# Patient Record
Sex: Male | Born: 1960 | Race: White | Hispanic: No | Marital: Married | Smoking: Former smoker
Health system: Southern US, Community
[De-identification: ages and names within clinical notes are randomized; demographics above are authoritative.]

## PROBLEM LIST (undated history)

## (undated) DIAGNOSIS — I639 Cerebral infarction, unspecified: Secondary | ICD-10-CM

## (undated) DIAGNOSIS — Z9119 Patient's noncompliance with other medical treatment and regimen: Secondary | ICD-10-CM

## (undated) DIAGNOSIS — F1510 Other stimulant abuse, uncomplicated: Secondary | ICD-10-CM

## (undated) DIAGNOSIS — F1120 Opioid dependence, uncomplicated: Secondary | ICD-10-CM

## (undated) DIAGNOSIS — F1410 Cocaine abuse, uncomplicated: Secondary | ICD-10-CM

## (undated) DIAGNOSIS — F1920 Other psychoactive substance dependence, uncomplicated: Secondary | ICD-10-CM

## (undated) DIAGNOSIS — I10 Essential (primary) hypertension: Secondary | ICD-10-CM

## (undated) DIAGNOSIS — Z91199 Patient's noncompliance with other medical treatment and regimen due to unspecified reason: Secondary | ICD-10-CM

## (undated) DIAGNOSIS — E119 Type 2 diabetes mellitus without complications: Secondary | ICD-10-CM

## (undated) DIAGNOSIS — R7881 Bacteremia: Secondary | ICD-10-CM

## (undated) DIAGNOSIS — Z22322 Carrier or suspected carrier of Methicillin resistant Staphylococcus aureus: Secondary | ICD-10-CM

## (undated) DIAGNOSIS — I509 Heart failure, unspecified: Secondary | ICD-10-CM

## (undated) DIAGNOSIS — L89159 Pressure ulcer of sacral region, unspecified stage: Secondary | ICD-10-CM

## (undated) DIAGNOSIS — D62 Acute posthemorrhagic anemia: Secondary | ICD-10-CM

## (undated) DIAGNOSIS — E46 Unspecified protein-calorie malnutrition: Secondary | ICD-10-CM

## (undated) DIAGNOSIS — S8290XA Unspecified fracture of unspecified lower leg, initial encounter for closed fracture: Secondary | ICD-10-CM

## (undated) DIAGNOSIS — G894 Chronic pain syndrome: Secondary | ICD-10-CM

## (undated) DIAGNOSIS — I70202 Unspecified atherosclerosis of native arteries of extremities, left leg: Secondary | ICD-10-CM

## (undated) DIAGNOSIS — T4145XA Adverse effect of unspecified anesthetic, initial encounter: Secondary | ICD-10-CM

## (undated) DIAGNOSIS — M5136 Other intervertebral disc degeneration, lumbar region: Secondary | ICD-10-CM

## (undated) DIAGNOSIS — N390 Urinary tract infection, site not specified: Secondary | ICD-10-CM

## (undated) DIAGNOSIS — I96 Gangrene, not elsewhere classified: Secondary | ICD-10-CM

## (undated) DIAGNOSIS — B958 Unspecified staphylococcus as the cause of diseases classified elsewhere: Secondary | ICD-10-CM

## (undated) DIAGNOSIS — M51369 Other intervertebral disc degeneration, lumbar region without mention of lumbar back pain or lower extremity pain: Secondary | ICD-10-CM

## (undated) DIAGNOSIS — T8859XA Other complications of anesthesia, initial encounter: Secondary | ICD-10-CM

## (undated) DIAGNOSIS — G8929 Other chronic pain: Secondary | ICD-10-CM

## (undated) HISTORY — DX: Unspecified fracture of unspecified lower leg, initial encounter for closed fracture: S82.90XA

## (undated) HISTORY — PX: FEMUR SURGERY: SHX943

## (undated) HISTORY — PX: OTHER SURGICAL HISTORY: SHX169

## (undated) HISTORY — DX: Other chronic pain: G89.29

## (undated) HISTORY — PX: BACK SURGERY: SHX140

## (undated) HISTORY — PX: WRIST SURGERY: SHX841

## (undated) HISTORY — DX: Unspecified staphylococcus as the cause of diseases classified elsewhere: B95.8

## (undated) HISTORY — DX: Carrier or suspected carrier of methicillin resistant Staphylococcus aureus: Z22.322

## (undated) HISTORY — PX: HERNIA REPAIR: SHX51

---

## 2001-12-09 ENCOUNTER — Encounter: Payer: Self-pay | Admitting: Neurology

## 2001-12-09 ENCOUNTER — Ambulatory Visit (HOSPITAL_COMMUNITY): Admission: RE | Admit: 2001-12-09 | Discharge: 2001-12-09 | Payer: Self-pay | Admitting: Neurology

## 2002-01-16 ENCOUNTER — Encounter: Payer: Self-pay | Admitting: Thoracic Surgery

## 2002-01-20 ENCOUNTER — Inpatient Hospital Stay (HOSPITAL_COMMUNITY): Admission: RE | Admit: 2002-01-20 | Discharge: 2002-01-24 | Payer: Self-pay | Admitting: Neurosurgery

## 2002-01-20 ENCOUNTER — Encounter: Payer: Self-pay | Admitting: Thoracic Surgery

## 2002-01-21 ENCOUNTER — Encounter: Payer: Self-pay | Admitting: Thoracic Surgery

## 2002-01-22 ENCOUNTER — Encounter: Payer: Self-pay | Admitting: Thoracic Surgery

## 2002-01-24 ENCOUNTER — Encounter: Payer: Self-pay | Admitting: Thoracic Surgery

## 2002-02-11 ENCOUNTER — Inpatient Hospital Stay (HOSPITAL_COMMUNITY): Admission: AD | Admit: 2002-02-11 | Discharge: 2002-02-21 | Payer: Self-pay | Admitting: Neurosurgery

## 2002-02-11 ENCOUNTER — Encounter: Payer: Self-pay | Admitting: Infectious Diseases

## 2002-03-01 ENCOUNTER — Encounter (INDEPENDENT_AMBULATORY_CARE_PROVIDER_SITE_OTHER): Payer: Self-pay | Admitting: Specialist

## 2002-03-01 ENCOUNTER — Encounter: Payer: Self-pay | Admitting: Neurosurgery

## 2002-03-01 ENCOUNTER — Inpatient Hospital Stay (HOSPITAL_COMMUNITY): Admission: EM | Admit: 2002-03-01 | Discharge: 2002-03-10 | Payer: Self-pay | Admitting: Emergency Medicine

## 2002-03-10 ENCOUNTER — Encounter: Payer: Self-pay | Admitting: Thoracic Surgery

## 2002-03-10 ENCOUNTER — Inpatient Hospital Stay (HOSPITAL_COMMUNITY)
Admission: RE | Admit: 2002-03-10 | Discharge: 2002-03-25 | Payer: Self-pay | Admitting: Physical Medicine & Rehabilitation

## 2002-03-17 ENCOUNTER — Encounter: Payer: Self-pay | Admitting: Physical Medicine & Rehabilitation

## 2002-03-19 ENCOUNTER — Encounter: Payer: Self-pay | Admitting: Physical Medicine & Rehabilitation

## 2002-03-23 ENCOUNTER — Encounter: Payer: Self-pay | Admitting: Physical Medicine & Rehabilitation

## 2002-06-22 ENCOUNTER — Emergency Department (HOSPITAL_COMMUNITY): Admission: EM | Admit: 2002-06-22 | Discharge: 2002-06-22 | Payer: Self-pay | Admitting: Emergency Medicine

## 2002-06-22 ENCOUNTER — Encounter: Payer: Self-pay | Admitting: Emergency Medicine

## 2002-06-24 ENCOUNTER — Encounter: Admission: RE | Admit: 2002-06-24 | Discharge: 2002-09-22 | Payer: Self-pay

## 2002-06-24 ENCOUNTER — Encounter: Admission: RE | Admit: 2002-06-24 | Discharge: 2002-06-24 | Payer: Self-pay | Admitting: Infectious Diseases

## 2004-03-25 ENCOUNTER — Ambulatory Visit: Payer: Self-pay | Admitting: Gastroenterology

## 2005-04-02 ENCOUNTER — Inpatient Hospital Stay
Admission: EM | Admit: 2005-04-02 | Disposition: A | Discharge status: Home / Self Care | Payer: Self-pay | Source: Ambulatory Visit | Admitting: Surgery

## 2006-08-15 ENCOUNTER — Emergency Department
Admission: EM | Admit: 2006-08-15 | Disposition: A | Discharge status: Home / Self Care | Payer: Self-pay | Source: Ambulatory Visit

## 2007-08-15 ENCOUNTER — Ambulatory Visit: Payer: Self-pay | Admitting: Gastroenterology

## 2008-06-13 ENCOUNTER — Emergency Department: Admission: EM | Admit: 2008-06-13 | Payer: Self-pay | Source: Ambulatory Visit

## 2008-12-24 ENCOUNTER — Encounter: Admission: RE | Admit: 2008-12-24 | Discharge: 2008-12-24 | Payer: Self-pay | Admitting: Neurosurgery

## 2009-05-15 HISTORY — PX: APPENDECTOMY (OPEN): SHX54

## 2009-07-29 ENCOUNTER — Other Ambulatory Visit: Payer: Self-pay

## 2009-07-29 ENCOUNTER — Inpatient Hospital Stay
Admission: AD | Admit: 2009-07-29 | Disposition: A | Discharge status: Home / Self Care | Payer: Self-pay | Source: Other Acute Inpatient Hospital | Admitting: Surgery

## 2009-07-29 ENCOUNTER — Emergency Department
Admission: EM | Admit: 2009-07-29 | Disposition: A | Discharge status: Other Acute Inpatient Hospital | Payer: Self-pay | Source: Ambulatory Visit

## 2010-08-10 ENCOUNTER — Other Ambulatory Visit: Payer: Self-pay | Admitting: Neurosurgery

## 2010-08-10 DIAGNOSIS — M549 Dorsalgia, unspecified: Secondary | ICD-10-CM

## 2010-08-16 ENCOUNTER — Ambulatory Visit
Admission: RE | Admit: 2010-08-16 | Discharge: 2010-08-16 | Disposition: A | Discharge status: Home / Self Care | Payer: Medicare Other | Source: Ambulatory Visit | Attending: Neurosurgery | Admitting: Neurosurgery

## 2010-08-16 DIAGNOSIS — M549 Dorsalgia, unspecified: Secondary | ICD-10-CM

## 2010-08-16 MED ORDER — GADOBENATE DIMEGLUMINE 529 MG/ML IV SOLN
13.0000 mL | Freq: Once | INTRAVENOUS | Status: AC | PRN
  Administered 2010-08-16: 13 mL via INTRAVENOUS

## 2010-09-30 NOTE — Discharge Summary (Signed)
NAMEYOVANNY, COATS NO.:  [id number]   MEDICAL RECORD NO.:  [id number]                   PATIENT TYPE:  IPS   LOCATION:  4003                                 FACILITY:  [facility]   PHYSICIAN:  Ellwood Dense, M.D.                DATE OF BIRTH:  28-Aug-1960   DATE OF ADMISSION:  03/10/2002  DATE OF DISCHARGE:  03/25/2002                                 DISCHARGE SUMMARY   DISCHARGE DIAGNOSES:  1. Incision and drainage, thoracic wound with status post T7-8 laminectomy.  2. Postoperative anemia.  3. Closure of right open thoracotomy wound.  4. Left ankle discomfort, question gout.   HISTORY OF PRESENT ILLNESS:  Mr. Petrovich is a 50 year old male with history  of gout, T8, T9, and T10 HNP who underwent thoracotomy with pulpectomy and  anterior fusion on September 8.  Was discharged to home and on September 10  admitted back with marked infection requiring I&D of wound.  He was  discharged to home on rifampin and Rocephin on October 10.  On October 17,  patient with problems ambulating and voiding and was admitted on October 18  for work up.  He was noted to have epidural abscess T7-T8 with some cord  compression T10-11.  He underwent T7-T8 laminectomy for evacuation of  abscess and micro disc dissection October 19 by Dr. __________.  The patient  continues on Ancef and Rifampin for minimum of six weeks total antibiotic  therapy, per ID.  His lower extremity strength is improved.  Currently on  slow Decadron drip.  PT initiated and patient is noted to be at supervision  for bed mobility, monitored for sit to stand, close supervision for sitting  at edge of bed.   PAST MEDICAL HISTORY:  Significant for:  1. Gout.  2. Insomnia.  3. Cyst on left wrist.   ALLERGIES:  No known drug allergies.   SOCIAL HISTORY:  The patient lives with girlfriend in a mobile home, was  independent with cane prior to admission.  Has a girlfriend who has been  very supportive  and can assist as needed.  He smokes one and a half packs  per day, drinks alcohol once a month.   HOSPITAL COURSE:  Mr. Casmir Auguste was admitted to rehabilitation on March 10, 2002, for inpatient therapy to consist of PT/OT daily.  Past admission  was started on subcutaneous Lovenox for DVT prophylaxis.  Patient was slowly  tapered off of this and iron supplements were added for postoperative  anemia.  Labs done on October 28 showed hemoglobin 10.9, hematocrit 34.3,  white count 13.2, platelets 289,000, sodium 139, potassium 4.0, chloride 99,  CO2 28, BUN 7, creatinine 0.8, glucose 94, LFTs within normal limits.  Patient has been followed along by ID during his stay.  He has completed 42  days total of Rifampin and Ancef antibiotic therapy.  He  did well initially.  On November 5, he was noted to have some problems with left lower extremity  weakness and pain, and a thoracic MR was done showing a right greater than  left L5-S1 neural foraminal narrowing with no root compression, spinal  stenosis T11 to L5, and no abnormal enhancement to suggest an infection.  Neurosurgery was contacted regarding results and no new input obtained.  The  patient's lower extremity strength did improve, and patient was  neurologically stable at time of discharge.  Patient has been able to  tolerate slow taper of steroids which are to continue past discharge.  Pain  control has been reasonable.  He has had complaints of left foot pain and  has been worried that he has felt worse due to gout.  Uric acid level  checked, was noted to be normal at 5.2.  Patient was started on colchicine  0.6 mg a day which has helped his symptomatology.  Last check of labs from  November 6, shows sodium 140, potassium 3.7, chloride 104, CO2 28, BUN 2,  creatinine 0.8, glucose 84.  H&H has been relatively stable at 9.7 and 30.1.  Stool guaiac x1 has been negative.  Patient to continue on Keflex b.i.d.  past discharge with  follow-up with ID for total duration of treatment.   Patient's back incision has healed without any signs or symptoms of  infection, no redness, no erythema noted.  He was noted to have an open  right thoracotomy wound that has been dressed b.i.d. basis.  Dr. Edwyna Shell, who  has been following patient along, elected on secondary wound closure on  March 19, 2002.  A follow-up chest x-ray of November 9, showed  postoperative changes with left lower lobe atelectasis and is stable.   CONDITION ON DISCHARGE:  During his stay in rehabilitation, Mr. Weathington  progressed to being modified independent for transfers, modified independent  ambulating 150 feet on level surfaces, requires close supervision on uneven  surfaces.  Close supervision for navigating six stairs.  He requires  assistance with set up for upper body care and some supervision for low body  bathing, dressing.  Patient to continue with follow-up __________ hospital  outpatient rehabilitation center beginning November 12.  On March 25, 2002, patient is discharged to home.   DISCHARGE MEDICATIONS:  1. __________ one p.o. b.i.d.  2. OxyContin CR 20 mg b.i.d. x1 week, then tapered to 10 mg per day.  3. Colchicine 0.6 mg.  4. Neurontin 300 mg b.i.d.  5. Oxycodone IR 5 to 10 mg p.o. q.4-6h. p.r.n. pain.  6. Valium 5 mg t.i.d. p.r.n. spasms.  7. K-Dur 20 mEq per day while on prednisone.  8. Prednisone 10 mg (see taper).  9. Keflex 500 mg b.i.d.   DISCHARGE ACTIVITIES:  Supervision with assistance, as needed.  Use walker.   WOUND CARE:  Wash back with soap and water and apply dry dressing b.i.d.   SPECIAL INSTRUCTIONS:  No alcohol, no smoking, no driving.   FOLLOW-UP:  1. Patient is to follow-up with Dr. Thomasena Edis in two to three weeks.  2. Follow-up with Dr. Roxan Hockey at outpatient clinic December 8 at 9 AM.  3. Follow-up with Dr. Lamar Benes as needed.    Greg Cutter, P.A.                    Ellwood Dense, M.D.     PP/MEDQ  D:  06/13/2002  T:  06/13/2002  Job:  [id number]

## 2010-09-30 NOTE — Op Note (Signed)
   NAMECAESAR, Kevin Whitney                            ACCOUNT NO.:  [id number]   MEDICAL RECORD NO.:  [id number]                   PATIENT TYPE:  IPS   LOCATION:  4003                                 FACILITY:  [facility]   PHYSICIAN:  Ines Bloomer, M.D.              DATE OF BIRTH:  [birth year]   DATE OF PROCEDURE:  DATE OF DISCHARGE:                                 OPERATIVE REPORT   PREOPERATIVE DIAGNOSIS:  Open thoracotomy wound.   POSTOPERATIVE DIAGNOSIS:  Open thoracotomy wound.   OPERATION PERFORMED:  Secondary wound closure.   SURGEON:  D.P. Edwyna Shell, M.D.   ANESTHESIA:  1% Xylocaine anesthesia and IV sedation.   OPERATION:  After prepping and draping left chest, the area of wound opening  was prepped and draped in the usual sterile manner. The patient had a  previous thoracotomy that got infected, and now the wound is granulating  well, and it is time to close the wound. It was infiltrated with 1%  Xylocaine superiorly and inferiorly, and then after that, it was closed with  two interrupted #1 Prolene sutures, taking large bites and closing enough so  that it allowed for some drainage between the sutures. Dry sterile dressing  applied. The patient returned to recovery room in stable condition.                                               Ines Bloomer, M.D.    DPB/MEDQ  D:  03/20/2002  T:  03/20/2002  Job:  [id number]

## 2010-09-30 NOTE — Discharge Summary (Signed)
NAMEAIKEN, WITHEM NO.:  [id number]   MEDICAL RECORD NO.:  [id number]                   PATIENT TYPE:  INP   LOCATION:  5006                                 FACILITY:  [facility]   PHYSICIAN:  Cristi Loron, M.D.            DATE OF BIRTH:  Nov 18, 1960   DATE OF ADMISSION:  02/11/2002  DATE OF DISCHARGE:  02/21/2002                                 DISCHARGE SUMMARY   HISTORY OF PRESENT ILLNESS:  The patient is a 50 year old white male who  underwent a T9 corpectomy and fusion by Dr. Edwyna Shell on January 20, 2002.  He  did well initially and developed fever and wound discharge.  He was admitted  on February 11, 2002, with wound infection.  Cultures demonstrated a  methicillin-sensitive Staphylococcus aureus.  The patient was seen by ID.  Empiric vancomycin and Rocephin were started.  Dr. Edwyna Shell subsequently took  the patient to the operating room for an exploration of his thoracic wound  and left the wound open.  (For details of this operation, please refer to  the operative note).   The patient's postoperative course was unremarkable.  Dr. Ninetta Lights of ID  recommended the patient be treated with daily IV Rocephin as well as p.o.  Rifampin.  Arrangements were made for home health care Care for dressing  changes and IV antibiotics.  By February 14, 2002, the patient was afebrile.  Pulse was stable, and he was eating well, ambulating well.  His wound seemed  to be improving and he was requesting discharge.  He was therefore  discharged home on February 21, 2002.   DISCHARGE INSTRUCTIONS:  The patient was instructed to follow up with me in  two weeks.  He was instructed to wear his TLSO at all times while out of  bed.   DISCHARGE MEDICATIONS:  1. The patient was instructed to resume his outpatient medical regimen.  2. Given prescriptions for Percocet 5, #60 one to two p.o. q.6h. p.r.n. for     breakthrough pain, no refills.  3. Rifampin 300 mg #31 one  p.o. q.d., no refills.  4. OxyContin 20 mg #60 one p.o. b.i.d. no refills.  5. Valium 5 mg #50 one p.o. q6h. p.r.n. for muscle spasms, no refills.  6. As above, it was arranged for him to get home IV Rocephin.    FINAL DIAGNOSIS:  Wound infection.   PROCEDURE PERFORMED:  Exploration of wound.                                                Cristi Loron, M.D.    JDJ/MEDQ  D:  02/21/2002  T:  02/24/2002  Job:  [id number]   cc:   Ines Bloomer, MD  [address]  [place]  [place] [address]  Fax: [phone]   Lacretia Leigh. Ninetta Lights, M.D.

## 2010-09-30 NOTE — H&P (Signed)
Kevin Whitney, LAMPE NO.:  [id number]   MEDICAL RECORD NO.:  [id number]                   PATIENT TYPE:  INP   LOCATION:  3308                                 FACILITY:  [facility]   PHYSICIAN:  Coletta Memos, MD                    DATE OF BIRTH:  04-Feb-1961   DATE OF ADMISSION:  03/01/2002  DATE OF DISCHARGE:                                HISTORY & PHYSICAL   ADMISSION DIAGNOSES:  1. Increasing pain.  2. Acute onset lower extremity paraplegia.  3. Acute onset urinary retention.   INDICATIONS:  The patient is a 50 year old gentleman who was initially taken  to the operating room 01/20/2002 for herniated disk at T8-9 and T9-10.  He  underwent a thoracotomy on the left side and a corpectomy of T9 with  anterolateral fusion using Sofamor Danek instrumentation.  Postoperatively,  he actually did quite well and was much improved.  He was doing well until  he was re-admitted in October.  At that time, he was found to have a wound  infection.  He was admitted actually on 9/30 with a wound infection.  Cultures demonstrated a methicillin-sensitive Staphylococcus aureus.  He was  seen by infectious disease.  He was also taken to the operating room by Dr.  Edwyna Shell for exploration of the thoracic wound, and the wound was packed open.  An abscess was found, and cultures were taken from the wound.  Postoperatively, he was sent home on Rifampin, and he was also taking  Robaxin 1 g IV q.d.   After being discharged home on 02/21/2002, the patient had a few occasions  were he said he was unable to walk, and then the next day he would say he  was able to walk.  It was not always clear just what his strength was.  I  was called last night by the patient and his girlfriend.  At that time, the  girlfriend stated the patient was unable to walk, but then the patient got  on the phone and said that he could walk.  He said it probably was not any  different from what it had been  recently and that he was just hurting a  little bit more, that he thought he had not taken his pain medications  correctly.  I then received another phone call at approximately 5:15 this  morning and at that time the patient stated that he was no longer able to  bear weight on his legs.  He could not walk and that he had not been able to  urinate since the previous day.  At that point, I instructed him to go to  the emergency room.  When seen in the emergency room, a Foley catheter was  placed, and he emptied 1100 cc of urine.  He also had extremely poor  strength in the lower extremities. Although  a poor historian, it was clear  at that point that this was different from what he had been previously.  I  had an MRI performed, and it showed what appeared to be a fluid collection  most consistent with an epidural abscess centered really at the T7-T8 disk  space, but extending up and down.  Given his urinary retention, the new  onset paraplegia of the lower extremities, I did not feel that conservative  treatment was warranted.  I, therefore, recommended to the patient that he  undergo operative resection.   PAST MEDICAL HISTORY:  1. Thoracotomy and resection of thoracic disk herniations.  2. Gout.  3. Degenerative disk disease.  4. He had a cyst from his left wrist removed in the past.   MEDICATIONS ON ADMISSION:  1. OxyContin 20 mg twice a day.  2. Colchicine 0.5 mg twice a day.  3. Rifampin 300 mg once a day.  4. Valium.  5. Indomethain.  6. Robaxin.   SOCIAL HISTORY:  He does drink alcohol, six to eight beers on a weekend.  He  also smokes one-half pack cigarettes a day, stating that he quit last month.  He never abused illicit drugs.   REVIEW OF SYSTEMS:  There is packing in the left thoracotomy wound.   PHYSICAL EXAMINATION:  VITAL SIGNS:  Temperature 97, blood pressure 163/88,  pulse 82, respiratory rate 24.  NEUROLOGIC:  Alert and oriented x 4 and answering all questions   appropriately.  He is an extremely poor historian because he does no like to  give yes and no answers, and he often times goes off on tangents. He has  normal strength in his upper extremities.  He has very good hamstring and  hip extensor strength, and he has 4/5 strength in the right quadriceps, 4-/5  left quadriceps.  He has 4 to 4+/5 plantar flexion bilaterally. He has very  poor dorsiflexion on the left, much better on the right.  He has very poor  hip flexion on the left, only 2/5 and about 4/5 on the right. Sensation is  intact.  Toes are downgoing to plantar stimulation.  He does have clonus.  He is myelopathic.  Pupils are equal, round, and reactive to light.  Full  extraocular movements. Tongue and uvula in midline.  Shoulder shrug is  normal.  Memory, language, attention, and fund of knowledge are normal.  GENERAL:  He is well kempt and in no distress.  He has a dressing bandage  over the left thoracotomy wound which is being packed.   ASSESSMENT:  The patient is being admitted for treatment of acute urinary  retention and paraplegia of the lower extremities.  MRI suggests epidural  abscess.  He will be taken to the operating room for evacuation.                                               Coletta Memos, MD    KC/MEDQ  D:  03/02/2002  T:  03/02/2002  Job:  [id number]

## 2010-09-30 NOTE — Op Note (Signed)
NAMEJAMAINE, QUINTIN                            ACCOUNT NO.:  [id number]   MEDICAL RECORD NO.:  [id number]                   PATIENT TYPE:  INP   LOCATION:  3107                                 FACILITY:  [facility]   PHYSICIAN:  Ines Bloomer, M.D.              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   OPERATION PERFORMED:  Left thoracotomy for exposure for nuclear disk and  vertebral body with fusion.   SURGEON:  Dr. Dorita Sciara for the left thoracotomy.   FIRST ASSISTANT:  Lloyd Huger, RNA FA   SURGEON:  Dr. Darryll Capers did diskectomy and fusion.   This 50 year old patient had severe myelopathy and pain, and had a  __________ insertion of his T9-10 spinal cord.  He was turned to the left  lateral thoracotomy position after insertion of the double-lumen tube and  general anesthesia.  He was prepped and draped in the usual sterile manner.  Posterolateral thoracotomy was made, carried down with electrocautery  through the subcutaneous tissue and fascia.  Latissimus was divided.  His  radius was reflexed anteriorly and partially splint.  The seventh  intercostal space was entered, and a portion of the eighth rib was taken  subperiosteally.  A Finochietto retractor was inserted, and Tuffier was  placed at right angle.  The lung was collapsed, and a malleable retractor  was placed to deflect the diaphragm, as well as the lung.  The pleura was  dissected up over the 9-10 disk space and reflected medially.  This allowed  [place] to ligate the __________ artery was between clips and divided, and then a  spinal needle was placed in the disk space and confirmed by x-ray to be the  9-10 disk space.  After this had been done, Dr. Lovell Sheehan performed the  vertebrectomy and diskectomy and reconstructed the spacer as well as other  hardware.   After this had been completed, he placed a small catheter in the  disk space, and then the chest was closed in layers.  Two chest tubes were  placed.  Two separate stab wounds were tied in place and will seal itself.  The pericostal was closed with #1 chromic and tied in place, and then the on-  cu catheters were inserted underneath the pericostals, bringing in 2  separate stab wounds and using the introducer to  bring them in.  They were sutured and placed with 3-0 silk.  A marking block  was done in the usual fashion.  The chest was then closed with #1 Vicryl in  the muscle layer, 2-0 Vicryl in the subcutaneous tissue and 3-0 Vicryl  subcuticular stitch.  Dermabond for the skin.  Patient was then returned to  the recovery room in stable condition.  Ines Bloomer, M.D.    DPB/MEDQ  D:  01/20/2002  T:  01/21/2002  Job:  [id number]   cc:   Ines Bloomer, M.D.  [address]  [place]  [place] [address]  Fax: [phone]   Stacie Glaze, M.D. [facility]

## 2010-09-30 NOTE — Op Note (Signed)
NAME:  Kevin Whitney, Kevin Whitney NO.:  [id number]   MEDICAL RECORD NO.:  [id number]                   PATIENT TYPE:   LOCATION:                                       FACILITY:  [facility]   PHYSICIAN:  Coletta Memos, MD                    DATE OF BIRTH:  1960/06/13   DATE OF PROCEDURE:  03/02/2002  DATE OF DISCHARGE:                                 OPERATIVE REPORT   PREOPERATIVE DIAGNOSIS:  Thoracic epidural abscess.   POSTOPERATIVE DIAGNOSIS:  Thoracic epidural abscess.   PROCEDURE:  Thoracic laminectomy T7-T8 for epidural abscess evacuation with  microdissection.   SURGEON:  Coletta Memos, M.D.   ANESTHESIA:  General endotracheal.   COMPLICATIONS:  None.   FINDINGS:  A small amount of purulent material and granulation tissue  ventral to cord at T7-8 and a small canal with slightly tethered appearance  to the cord prior to abscess removal.   INDICATIONS:  The patient is a gentleman who has a Staphylococcus wound  infection after thoracotomy for a T9 corpectomy due to herniated disk at T8-  9 and T9-10, which caused myelopathy.  He came in today with complete  urinary retention of 1100 cc and weakness in the lower extremities.  I  therefore recommended and he agreed to undergo operative resection of what I  believe to be an abscess seen on MRI.   DESCRIPTION OF PROCEDURE:  The patient was brought to the operating room,  intubated and placed under general anesthesia without difficulty.  He was  rolled prone onto a Wilson frame, and all pressure points were properly  padded.  The back was shaved, prepped and draped in a sterile fashion.  I  made a skin incision with a #10 blade and took this down to the  thoracolumbar fascia.  I then exposed the thoracic lamina and placed towel  clips to localize them.  Using the hardware as a guide I knew I needed to be  at the top of the hardware and one level above.  I then extended my incision  rostrally.  Once I was done  I then again placed markers on the spinous  processes.  When I saw that I was in the correct position, I then proceeded  with the laminectomy.  Using a high speed drill and Kerrison punches along  with Leksell rongeur I was able to remove the ligament of flavum.  I removed  the lamina, spinous process of ligament of flavum of T7-T8.  I initially  encountered what was very thick and what I think was tenacious epidural fat,  but this was clearly not normal fat as it needed to be dissected from the  dural sac.  It did not appear anything other than yellow but it was much  thicker and much more fibrous.  I then drilled away bone laterally again  exposing  the left T7 nerve root, and identified the pedicle.  There was  adherent to the cord ventral laterally a yellowish mass.  I used an 11 blade  and microscopic dissection to open this mass, and there was purulent  material which emanated from that small mass.  I then dissected that with  microdissectors, small spatulas and scrape probes.  I was able to remove  some of the capsule which I also sent to the laboratory.  After achieving  what I believe to be a good decompression and completing the laminectomy to  relieve the pressure posteriorly I irrigated the  wound.  I took the microscope out of the operative field.  I then closed the  wound in layered fashion using Vicryl sutures.  Hemostasis was obtained.  The subcutaneous levels were also reapproximated with Vicryl.  Dermabond was  used for a sterile dressing.                                               Coletta Memos, MD    KC/MEDQ  D:  03/02/2002  T:  03/02/2002  Job:  [id number]

## 2010-09-30 NOTE — Consult Note (Signed)
NAMEWALFRED, BETTENDORF NO.:  [id number]   MEDICAL RECORD NO.:  [id number]                   PATIENT TYPE:  REC   LOCATION:  TPC                                  FACILITY:  [facility]   PHYSICIAN:  Zachary George, DO                      DATE OF BIRTH:  February 07, 1961   DATE OF CONSULTATION:  06/25/2002  DATE OF DISCHARGE:                                   CONSULTATION   REFERRING PHYSICIAN:  Cristi Loron, M.D.   Dear Dr. Lovell Sheehan:   Thank you very much for kindly referring Mr. Tashaun Obey to [facility] evaluation.  Mr. Word was seen in our  clinic today.  Please refer to the following for details regarding the  history and physical examination and treatment plan.  Once again, thank you  for allowing [place] to participate in the care of Mr. Rotter.   CHIEF COMPLAINT:  Back pain.   HISTORY OF PRESENT ILLNESS:  Mr. Mitrano is a pleasant, 50 year old, right-  hand-dominant male, who was kindly referred by Cristi Loron, M.D. for  evaluation for pain management.  The patient complains of thoracic back pain  with pain radiating into his lower extremities, difficulty walking, and some  mild spasticity in his left greater than right lower extremity.  The patient  states that he was working in 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  He began seeking medical treatment because  he was having pain in his feet.  Over time, his symptoms progressed, and he  developed myelopathy.  MRI of the thoracic spine revealed thoracic disk  herniations at T8-9 and T9-10, compressing the spinal cord with spinal cord  gliosis.  The patient was referred to Cristi Loron, M.D., who  performed thoracotomy with T9 corpectomy and T8 through 10 fusion on  01/20/02, for herniated disks, myelopathy, and Scheuermann's disease.  The  patient states he was doing very well following the surgery, but he  developed complication of a thoracic epidural abscess which manifested as  urinary retention. The patient was then taken back emergently to the  operating room on 03/02/02, where he underwent thoracic laminectomy T7-8.  Following that surgery, he was an inpatient at the [facility] [address],  where he was under the care of Ellwood Dense, M.D. for approximately nine  days.  He has been treated with various pain medications since that time,  including OxyContin 20 mg b.i.d. which patient states was too strong, and he  could not tolerate it.  He states that he poured most of the pills down the  drain.  Most recently, he has been on Norco 10 mg/325 mg up to 10 pills per  day per Dr. Lovell Sheehan.  He has also been on diazepam 10 mg b.i.d., but patient  states that he does not take that medication as it does not seem to help him  in any particular way.  He presents today to discuss pain management  options.  In terms of his hydrocodone, he states, I eat them until I don't  hurt.  I reviewed his pharmacy printout from [facility] which he brings with  him.  He apparently received 50 Norco 10 mg/325 mg on 06/23/02, and states  he is out of medications.  He states that he spilled some on the counter,  and they dissolved.  It also looks like he received 100 tablets on 06/13/02.  His father, who accompanies him, also produces a new prescription per Dr.  Lovell Sheehan for 100 more Norco to be taken 1-2 every 4-6 hours.  Per Dr.  Lovell Sheehan' notes, he has counseled the patient not to take more than 10 per  day.  The patient also lifts up his sleeve on his left arm and reveals a 50  mcg Duragesic patch.  He lifts up his other sleeve and reveals another 50  mcg Duragesic patch.  I questioned him about this because there is no  Duragesic on his pharmacy print-out.  The patient states that his primary  care Cleola Perryman, [name], M.D., at [facility] in [place], prescribed  Duragesic to him, but he cannot remember the dose.  The patches that he  currently has on belong to his girlfriend.  I counseled the patient about  not using other people's controlled substances and advised him against it.  He states that he put one patch on last night and did not fall asleep for  two hours, so he put another patch on.  He states that the patches are not  really helping his pain anyway.  He is very elusive when asking whether or  not the hydrocodone helps his pain.  Currently, his pain is a 9-10/10 on a  subjective scale.  He describes his pain as dull, achy, throbbing, sharp,  burning, constant, with associated numbness, tingling, and weakness.  Symptoms are worse with walking, bending, sitting, working, and therapy and  are not improved with any particular measure.  The patient's function and  quality of life indices have declined, and his sleep is poor.  He currently  walks with a standard walker secondary to lower extremity weakness.  I asked  Mr. Cardamone if he had the prescription for Duragesic written by his primary  care Alfreddie Consalvo, at which time he told me that his father may have it out in  the waiting room.  Nursing went and asked his father for prescriptions.  The  patient's father had prescriptions for Norco 10 mg/325 mg #100 written by  Dr. Lovell Sheehan on 06/24/02.  There was also a prescription from Dr. Lovell Sheehan for  antifungal mouthwash.  The patient's father also had two prescriptions from  [place] C. Ninetta Lights, M.D. for rifampin and Tequin.  He did not have any  prescriptions for Duragesic.  I called Dr. Webb Silversmith office and spoke to one  of his partners, who confirmed that Dr. Burnett Sheng had written a prescription  for Duragesic 50 mcg on 06/24/02.  I reviewed health and history form and 14  point review of systems.  The patient admits to erectile dysfunction.  He denies fevers, chills, night sweats.  Admits to significant weight loss  since  his surgery of greater than 100 pounds.  He denies bowel and  bladder  dysfunction at this time.   PAST MEDICAL HISTORY:  1. Oral thrush.  2. Epidural abscess.   PAST SURGICAL HISTORY:  Thoracotomy with T9 corpectomy and T8 through 10  fusion, T7-8 laminectomy for epidural abscess.   FAMILY HISTORY:  Hypertension.   SOCIAL HISTORY:  The patient states that he used to smoke and drink quite a  bit but quit smoking prior to his surgery and quit drinking prior to his  surgery.  He does admit to alcohol abuse.  He is single.  He is not  currently working.   ALLERGIES:  No known drug allergies.   MEDICATIONS:  1. Norco 10 mg/325 mg 10 pills per day.  2. Diazepam 10 mg b.i.d. p.r.n.  3. The patient has prescription for Duragesic 50 mcg per his primary care     Deetya Drouillard; however, he is self-medicating with 100 mcg of Duragesic over     the last day.   PHYSICAL EXAMINATION:  GENERAL:  A healthy-appearing male in no acute  distress.  VITAL SIGNS:  Blood pressure is 122/74, pulse 103, respirations 14 and  regular, O2 saturation 95% on room air.  The patient's voice is mildly  hoarse.  The patient has a difficult time getting up from the chair to a  standing position and needs to lean against the wall.  BACK:  I examined patient's back which reveals increased thoracic kyphosis.  There is a large right thoracotomy scar and a vertical midline incisional  scar in the thoracic spine which is well-healed.  There are no signs of  infection at this time.  Gait is antalgic and minimally spastic.  Manual  muscle testing is 5/5 right hip flexures, right knee extensors, bilateral  extensor hallucis longus, and bilateral ankle flexors, less than 3/5 left  hip flexures, left knee extensors, and left ankle dorsiflexors.  Sensory  examination reveals decreased light touch and pinprick in the patchy  distribution bilateral lower extremities, left greater than right.  Muscle  stretch reflexes are 3+/4  bilateral patellar, 2+/4 bilateral Achilles, and  2+/4 bilateral medial hamstrings with downgoing toes bilaterally.  There is  no ankle clonus noted at this time.  There is mild spasticity noted in the  left lower extremity and no significant abnormal tone in the right lower  extremity.  Straight leg raise is negative bilaterally in the seated  position.    IMPRESSION:  1. Thoracic back pain.  2. Thoracic disk herniation with myelopathy, status post thoracotomy with     thoracic fusion, T8 through T10, complicated by epidural abscess     requiring T7 through 8 laminectomy.  3. Spasticity lower extremities.  4. Chronic pain syndrome.   PLAN:  1. Discussed treatment options at length with Mr. Dobbins and his father.     This was an extensive consultation of 60 minutes duration in obtaining     records, information, and coordinating care. 2. In regard to patient's medications, I explained to him that Duragesic 50     mcg is roughly equivalent to 100 mg of hydrocodone per day, and I     recommend he give a fair trial of Duragesic 50 mcg q.72h. and to use his     hydrocodone as needed up to four times a day for breakthrough pain.  In     addition, we discussed other nonnarcotic adjunctive treatments to include     Zonegran 100 mg daily for neuropathic component, and I have provided him  with 14 sample pills.  3. Will provide Pamelor 10 mg 1-2 p.o. q.h.s. #60 for restorative sleep     capacity, no refills.  4. Consider Lidoderm patches.  5. Consider TENS unit.  6. If patient is not getting considerable relief of his pain with the above-     noted measures, including maximizing the anticonvulsant medications and     tricyclic antidepressants, would consider referral for evaluation for a     pump or spinal cord stimulator, although this may be contraindicated     given patient's previous infection and current treatment with     antibiotics.  7. The patient is instructed to follow up  with his primary care Makai Dumond.  8. The patient to call our clinic in two weeks to inform us of the efficacy     of Zonegran.  Will continue with the 100 mg daily dosing if it is     helpful.  If not, we will consider increasing to 200 mg daily.  9. The patient to return to clinic in one month for reevaluation.   The patient was educated on the above findings and recommendations and  understands.  He was also counseled on our controlled substance agreement at  length by one of our nursing staff.  He was instructed to bring all of his  pain medications including his used and unused Duragesic patches to his next  visit.  He understands.  There were no barriers to communication.                                               Zachary George, DO    JW/MEDQ  D:  06/25/2002  T:  06/25/2002  Job:  [id number]   cc:   Cristi Loron, M.D.  [address]  [place] [address]  Fax: [phone]   [name], M.D.  [facility] [place], [place]  Fax number [phone]

## 2010-09-30 NOTE — Op Note (Signed)
NAMEALAKAI, Kevin Whitney NO.:  [id number]   MEDICAL RECORD NO.:  [id number]                   PATIENT TYPE:  INP   LOCATION:  3107                                 FACILITY:  [facility]   PHYSICIAN:  Cristi Loron, M.D.            DATE OF BIRTH:  1960-12-24   DATE OF PROCEDURE:  01/20/2002  DATE OF DISCHARGE:                                 OPERATIVE REPORT   BRIEF HISTORY:  The patient is a 50 year old white male, who had suffered  from progressive difficulty with ambulation with numbness, tingling and  weakness of his legs.  He was worked up as an outpatient with a thoracic  MRI, which demonstrated a very large herniated disk at T9-10 and if any  centrally with severe spinal cord present, as well as a large herniated disk  at T8-9 on the left.  I discussed the various treatment options with him  including surgery.  The patient weighed the risks, benefits, and  alternatives to surgery and decided to proceed with a thoracotomy for a  thoracic corpectomy, fusion, and instrumentation.   PREOPERATIVE DIAGNOSES:  T8-9 and T9-10 herniated disk, thoracic spinal  stenosis, thoracic myelopathy, Scheuermann's Disease.   POSTOPERATIVE DIAGNOSES:  T8-9 and T9-10 herniated disk, thoracic spinal  stenosis, thoracic myelopathy, Scheuermann's Disease.   PROCEDURE:  Thoracotomy for T9 corpectomy; insertion interbody Vertisban  titanium spacer (Sophomore Danek) with a titanium screw and rod  stabilization T8 to T10, interbody arthrodesis T8-9 and T9-10 using  microdissection.   SURGEON:  Cristi Loron, M.D.   ASSISTANT:  Tanya Nones. Botero, MD   ANESTHESIA:  General endotracheal.   ESTIMATED BLOOD LOSS:  500 cc.   SPECIMENS:  None.   DRAINS:  None.   COMPLICATIONS:  None.   DESCRIPTION OF PROCEDURE:  The patient was brought to the operating room by  the anesthesia team.  General endotracheal anesthesia was induced.  The  patient was then turned to the  lateral position with his left side up.  His  thoracic region was then prepared with Betadine scrub and Betadine solution.  Sterile drapes were applied.  Dr. Edwyna Shell did the exposure (for the details  of this exposure, please refer to his operative note).   After Dr. Edwyna Shell had provided exposure to the lateral aspect of the  vertebral bodies at T8, 9, and 10, I took over the case.  I used  electrocautery to expose, clear soft tissue from the lateral aspect of the  T9 vertebra.  Dr. Edwyna Shell had previously ligated the radicular artery at the  T9 vertebra.  I used Hemoclips to ligate the radicular vein at T8 and T10,  and cleared the soft tissue from the lower aspect of T8 and T9 vertebral  bodies, exposed the articulation of the 8th, 9th, and 10th ribs to the  vertebral body.  I then obtained an intraoperative radiograph to confirm my  location.   We did the decompression by incising the T8-9 and T9-10 intervertebral disks  with a 15-blade scalpel and performed a partial diskectomy using pituitary  forceps.  I then used the Leksell rongeurs to perform a partial corpectomy.  The bone was saved for later use in the fusion process.  Then, I used a  high-speed drill to drill away the remainder of the vertebral body leaving a  rim of cortical bone posteriorly and removed the T8-9 and T9-10  intervertebral disks and scraped the vertebral end plates at T8 and T10  using the curets.   We then brought the operating microscope into the field.  Under instrument  magnification and illumination, completed the microdissection/decompression.  I used the high-speed drill to complete the drilling of the bone away  posteriorly exposing the posterior longitudinal ligament at T9 and incised  the ligament with a 15-blade scalpel and used the microscope to dissect the  epidural tissue and expose the underlying dura.  The dura of course was then  decompressed using microdissection and a combination of curets and  the  Kerrison punch being very careful the proceed with the decompression in an  anterior direction.  We carefully decompressed the thecal sac.  There was a  large herniated disk at T9-10 centrally, which was removed using the  pituitary forceps and the Kerrison punches again in a ventral direction,  i.e. away from the spinal cord.  The procedure was repeated at T8-9 with a  large lateral herniated disk, which was similarly removed with Kerrison  punch and pituitary forceps.  At this point, we had good decompression of  the thecal sac and spinal cord from T8-9 down to T9-10.  We obtained  hemostasis using Gelfoam.   I now turned attention to the interbody fusion.  We obtained the small short  thoracic Vertisban titanium device and placed it into the corpectomy site  and extended the device somewhat distracting the T8 and T10 vertebral  bodies.  There was a good snug fit of the device and we then obtained an  intraoperative radiograph that demonstrated good position of the device.   We then completed the arthrodesis at T8-9 and T9-10 by placing local  morselized autograft bone, i.e. the bone had been cleared of soft tissue and  obtained on the corpectomy into the corpectomy site and around the cage.   Then, we then turned our attention to the anterior spinal instrumentation.  We placed the thoracic staples along the lateral aspect of the T8 and T10  vertebral body after removing some lateral spondylosis using the Leksell  rongeurs.  The awl was then used to drill a pilot hole through the staples.  The hole was then tapped and two 6.5 x 45 mm screws were placed at T10 and  two 6.5 x 40 mm screws were placed at T8.  Intraoperative radiograph was  obtained and the instrumentation looked good.  The screws were then  connected with a 70 and 80 mm rod.  The appropriate cap was placed and then tightened using the snap-off device, the snap-off screws, and then a lock  connector was placed and  appropriately tightened.  This was a good, firm  construct and decompression looked good.  The wound was then copiously  irrigated with Bacitracin solution and the solution was removed, stringent  hemostasis was obtained using bipolar electrocautery and Gelfoam.  A 10 mm  flat Jackson-Pratt drain was placed in the epidural space and brought out  through a separate stab wound.  At this point, Dr. Edwyna Shell took over the case  and completed the closure.                                                Cristi Loron, M.D.    JDJ/MEDQ  D:  01/21/2002  T:  01/22/2002  Job:  [id number]

## 2010-09-30 NOTE — Discharge Summary (Signed)
Kevin Whitney, Kevin Whitney NO.:  [id number]   MEDICAL RECORD NO.:  [id number]                   PATIENT TYPE:  INP   LOCATION:  3031                                 FACILITY:  [facility]   PHYSICIAN:  Cristi Loron, M.D.            DATE OF BIRTH:  [birth year]   DATE OF ADMISSION:  01/20/2002  DATE OF DISCHARGE:  01/24/2002                                 DISCHARGE SUMMARY   NOTATION:  For full details of this admission, please refer to typed history  and physical.   HISTORY OF PRESENT ILLNESS:  The patient is a 50 year old white male who  suffered from a thoracic myelopathy.  A workup for an MRI demonstrated a  huge herniated disk at T9-10 and a large herniated disk at T9.  I discussed  the various treatment options with him and recommended that he undergo  surgery.  The patient weighed the risks, benefits, and alternatives of  surgery and decided to proceed with the operation.   HOSPITAL COURSE:  I admitted the patient to [facility] on  01/20/2002 with a diagnosis of  T8-9 and T9-10 herniated disk, thoracic  myelopathy paraparesis.  On admission he underwent surgery.  Dr. Edwyna Shell did  a thoracotomy to expose the T9 vertebra.  Then I did a T9 corpectomy with  fusion at T8-9 and T9-10, and anterior instrumentation of this.  A  __________ spacer and a titanium rod and screw anterior fixation  (Medtronic.)  His surgery went well without complications (for full details  of the operation please refer to both Dr. Scheryl Darter and my operative note.)   Postoperative course:  The patient was initially monitored in the  neurosurgical intensive care unit.  He did well.  Over the next couple days  Dr. Edwyna Shell removed the chest tubes, and we removed the drain.  The patient  was transferred to the Neurosurgical General floor.   He did complain of a lot of left ankle pain which was a chronic problem for  him.  He had a diagnosis of gout in the past.  We had the  hospice, i.e. Dr.  Tresa Endo see him and he has treated him with medications including prednisone  and colchicine.  We checked an ankle x-ray which demonstrated small joint  effusion, but otherwise was okay.  The patient's ankle pain resolved by  01/24/2002.   We did have the rehab doctors see the patient and they thought it was  appropriate for in-patient rehab.  By 01/24/2002 the patient said he was  feeling fine.  His ankle felt better.  He was ambulating well and his wound  was healing well without signs of infection.  He was afebrile.  His vitals  were stable.  He was not interested in going to in-patient rehab and he  wanted to be discharged to home.  He was therefore discharged to home on  01/24/2002.  DISCHARGE INSTRUCTIONS:  The patient was instructed to wear his TLSO at all  times while out of bed and to put it on while he is lying in bed.  I  expressed the importance of this to him.  He seemed to understand.  I also  stressed the importance of him quitting smoking.  He was instructed to  followup with me in 4 weeks and Dr. Edwyna Shell per his instructions.   DISCHARGE MEDICATIONS:  1. Percocet 5 #60 1-2 p.o. q.4 h. p.r.n. for pain limit 8 per day.  2. Valium 5 mg #50 one p.o. q.6 h. p.r.n. for muscle spasms.  3. Colchicine 0.6 mg #90 one p.o t.i.d.  4. Prednisone 40 mg #2 one p.o. q.d. times two days, all with no refills.   FINAL DIAGNOSIS:  T8-9 and T9-10 herniated nucleus pulposus, stenosis,  thoracic myelopathy.   PROCEDURE PERFORMED:  Thoracotomy for T9 vertebrectomy interbody fusion T9  and T9-10, anterior spinal instrumentation with __________  vertebral body  replacement, and titanium plate and screw fixation (Medtronic.)                                               Cristi Loron, M.D.    JDJ/MEDQ  D:  01/24/2002  T:  01/26/2002  Job:  [id number]   cc:   Ines Bloomer, M.D.  [address]  [place]  [place] [address]  Fax: [phone]

## 2010-09-30 NOTE — Discharge Summary (Signed)
NAMETEREK, BEE NO.:  [id number]   MEDICAL RECORD NO.:  [id number]                   PATIENT TYPE:  INP   LOCATION:  3002                                 FACILITY:  [facility]   PHYSICIAN:  Cristi Loron, M.D.            DATE OF BIRTH:  04-Jan-1961   DATE OF ADMISSION:  03/01/2002  DATE OF DISCHARGE:  03/10/2002                                 DISCHARGE SUMMARY   For full details of this admission, please refer to the history and  physical.   BRIEF HISTORY:  The patient is a 50 year old white male who presented with a  severe thoracic myelopathy and was found to have a T8-9 and T9-10 thoracic  herniated disk with severe spinal cord compression. He previously underwent  a thoracotomy for T9 corpectomy with interbody fusion and lateral  instrumentation. The surgery went well and he initially made a great  recovery. He did, however, subsequently develop a wound infection and was  readmitted and had his wound explored by Dr. Edwyna Shell and it turned out that  it was a staph aureus. The patient was seen by ID and started on  antibiotics. It was arranged for him to get home IV antibiotics and he was  suddenly discharged.   The patient was at home and developed acute urinary retention and left lower  extremity paraplegia. Dr. Franky Macho saw the patient as he was on call and had  an MRI scan which demonstrated an epidural abscess. He recommended a  thoracic laminectomy to drain the abscess. The patient weighed the risks,  benefits, and alternatives of surgery and decided to proceed with the  operation.   For further details of this admission, please refer to the history and  physical.   HOSPITAL COURSE:  Dr. Franky Macho admitted the patient on March 01, 2002 with  a diagnosis of acute urinary retention and left lower extremity paraplegia.  The patient underwent an emergent T2 to T8 laminectomy for evacuation of his  hematoma. The surgery went well (for  further details of the operation please  refer to Dr. Sueanne Margarita operative note).   POSTOPERATIVE COURSE:  The patient's postoperative course was as follows;  his strength improved after the laminectomy and we again had infectious  disease see the patient. The patient saw the physical therapist. We ask that  the physical medicine and rehabilitation doctors see the patient.   The patient at his initial hospitalization had declined to go into the rehab  unit. By March 10, 2002, the patient was felt to be stable to go into the  rehab unit. Dr. Edwyna Shell did see the patient throughout this hospitalization  and did some wound care.   The patient was transferred to the rehab unit on March 10, 2002.    FINAL DIAGNOSES:  Thoracic epidural abscess.   PROCEDURE PERFORMED:  Thoracic laminectomy for drainage of abscess.  Cristi Loron, M.D.    JDJ/MEDQ  D:  06/12/2002  T:  06/12/2002  Job:  [id number]   cc:   Ines Bloomer, M.D.  [address]  [place]  [place] [address]  Fax: [phone]   Cliffton Asters, M.D.  [address] [place]  [place] [address]  Fax: [phone]

## 2010-09-30 NOTE — Discharge Summary (Signed)
   Kevin Whitney, Kevin Whitney NO.:  [id number]   MEDICAL RECORD NO.:  [id number]                   PATIENT TYPE:  INP   LOCATION:  3031                                 FACILITY:  [facility]   PHYSICIAN:  Tanya Nones. 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   FINAL DIAGNOSIS:  Myelopathy secondary to herniated disc at the level of T10-  T11.   MEDICAL HISTORY:  The patient was admitted because of weakness and  ___________.  It was found that he has a large herniated disc at the level  T10-T11.  Surgery was advised.   HOSPITAL COURSE:  The patient was taken to surgery for a left thoracotomy  followed by dissection of the vertebral body and replacement with a  interbody fusion was made by Dr. Lovell Sheehan and Dr. Edwyna Shell.   Today, the patient is doing much better.  He is ambulating without cane.  He  is ready to go home.   CONDITION ON DISCHARGE:  Improved.   MEDICATIONS:  1. Percocet p.r.n. for pain.   DIET:  Regular.   ACTIVITY:  He is not to drive and is not to do any lifting.   FOLLOW UP:  He is to be seen by Dr. Lovell Sheehan next week.                                               Tanya Nones. Jeral Fruit, MD    EMB/MEDQ  D:  01/24/2002  T:  01/27/2002  Job:  [id number]

## 2010-09-30 NOTE — Op Note (Signed)
   NAMETEAL, BONTRAGER NO.:  [id number]   MEDICAL RECORD NO.:  [id number]                   PATIENT TYPE:  INP   LOCATION:  5006                                 FACILITY:  [facility]   PHYSICIAN:  Ines Bloomer, M.D.              DATE OF BIRTH:  1961-03-13   DATE OF PROCEDURE:  DATE OF DISCHARGE:                                 OPERATIVE REPORT   PREOPERATIVE DIAGNOSIS:  Status post fusion of  P10-12 via thoracotomy  incision, with drain of left thoracotomy wound.   POSTOPERATIVE DIAGNOSIS:  Status post fusion of P10-12 via thoracotomy  incision, with drain of left thoracotomy wound.   OPERATION:  Exploration of left thoracotomy wound.   SURGEON:  Ines Bloomer, M.D.   ANESTHESIA:  General.   INDICATIONS FOR PROCEDURE:  This patient had a previous fixation of a left  thoracotomy wound.  The left thoracotomy approach to the 6th intercostal  space and did well.  He went home, but he developed a fever and then started  draining out of the wound posteriorly.  He was brought to the operating room  for an exploration.  Vancomycin had been started.  He was growing positive  cocci in blood cultures.   DESCRIPTION OF PROCEDURE:  After general anesthesia the patient was turned  to the left lateral thoracotomy position.  The posterior portion of the  wound was opened for approximately 6.0 cm.  Dissection was carried down  through the subcutaneous tissue.  There was a portion of a rib that was in  the wound.  Approximately 4.0 to 5.0 cm of this rib was removed in order to  debride the area, and several areas of necrotic tissue were also debrided.  The wound went down between the ribs, down into the pleural space, but  appeared to be draining well, and the lung was stuck such that the area was  irrigated out copiously, and then packed with a Betadine-soaked curet.  An  ABD was then applied to the wound.  Cultures were taken.  The patient  returned to the  recovery room in stable condition.                                                Ines Bloomer, M.D.    DPB/MEDQ  D:  02/13/2002  T:  02/17/2002  Job:  [id number]

## 2011-02-07 ENCOUNTER — Emergency Department (HOSPITAL_COMMUNITY): Payer: Medicare Other

## 2011-02-07 ENCOUNTER — Inpatient Hospital Stay (HOSPITAL_COMMUNITY)
Admission: EM | Admit: 2011-02-07 | Discharge: 2011-02-21 | DRG: 592 | Disposition: A | Discharge status: Home Health | Payer: Medicare Other | Attending: Internal Medicine | Admitting: Internal Medicine

## 2011-02-07 DIAGNOSIS — L89609 Pressure ulcer of unspecified heel, unspecified stage: Secondary | ICD-10-CM | POA: Diagnosis present

## 2011-02-07 DIAGNOSIS — Z79899 Other long term (current) drug therapy: Secondary | ICD-10-CM

## 2011-02-07 DIAGNOSIS — L8993 Pressure ulcer of unspecified site, stage 3: Secondary | ICD-10-CM | POA: Diagnosis present

## 2011-02-07 DIAGNOSIS — M545 Low back pain, unspecified: Secondary | ICD-10-CM | POA: Diagnosis present

## 2011-02-07 DIAGNOSIS — F29 Unspecified psychosis not due to a substance or known physiological condition: Secondary | ICD-10-CM | POA: Diagnosis present

## 2011-02-07 DIAGNOSIS — Z9889 Other specified postprocedural states: Secondary | ICD-10-CM

## 2011-02-07 DIAGNOSIS — I10 Essential (primary) hypertension: Secondary | ICD-10-CM | POA: Diagnosis present

## 2011-02-07 DIAGNOSIS — D61818 Other pancytopenia: Secondary | ICD-10-CM | POA: Diagnosis not present

## 2011-02-07 DIAGNOSIS — E876 Hypokalemia: Secondary | ICD-10-CM | POA: Diagnosis present

## 2011-02-07 DIAGNOSIS — R5381 Other malaise: Secondary | ICD-10-CM | POA: Diagnosis present

## 2011-02-07 DIAGNOSIS — G8220 Paraplegia, unspecified: Secondary | ICD-10-CM | POA: Diagnosis present

## 2011-02-07 DIAGNOSIS — F411 Generalized anxiety disorder: Secondary | ICD-10-CM | POA: Diagnosis present

## 2011-02-07 DIAGNOSIS — L89109 Pressure ulcer of unspecified part of back, unspecified stage: Secondary | ICD-10-CM | POA: Diagnosis present

## 2011-02-07 DIAGNOSIS — E440 Moderate protein-calorie malnutrition: Secondary | ICD-10-CM | POA: Diagnosis present

## 2011-02-07 DIAGNOSIS — L8990 Pressure ulcer of unspecified site, unspecified stage: Secondary | ICD-10-CM | POA: Diagnosis present

## 2011-02-07 DIAGNOSIS — L8992 Pressure ulcer of unspecified site, stage 2: Secondary | ICD-10-CM | POA: Diagnosis present

## 2011-02-07 DIAGNOSIS — K029 Dental caries, unspecified: Secondary | ICD-10-CM | POA: Diagnosis present

## 2011-02-07 DIAGNOSIS — L89899 Pressure ulcer of other site, unspecified stage: Secondary | ICD-10-CM | POA: Diagnosis present

## 2011-02-07 DIAGNOSIS — G894 Chronic pain syndrome: Secondary | ICD-10-CM | POA: Diagnosis present

## 2011-02-07 DIAGNOSIS — L02219 Cutaneous abscess of trunk, unspecified: Secondary | ICD-10-CM | POA: Diagnosis present

## 2011-02-07 DIAGNOSIS — F17200 Nicotine dependence, unspecified, uncomplicated: Secondary | ICD-10-CM | POA: Diagnosis present

## 2011-02-07 DIAGNOSIS — L8994 Pressure ulcer of unspecified site, stage 4: Secondary | ICD-10-CM | POA: Diagnosis present

## 2011-02-07 LAB — DIFFERENTIAL
Basophils Absolute: 0 10*3/uL (ref 0.0–0.1)
Basophils Relative: 0 % (ref 0–1)
Eosinophils Absolute: 0.4 10*3/uL (ref 0.0–0.7)
Monocytes Absolute: 0.4 10*3/uL (ref 0.1–1.0)
Neutro Abs: 6.5 10*3/uL (ref 1.7–7.7)

## 2011-02-07 LAB — CBC
HCT: 41.5 % (ref 39.0–52.0)
MCH: 28.4 pg (ref 26.0–34.0)
MCV: 84.7 fL (ref 78.0–100.0)

## 2011-02-07 LAB — COMPREHENSIVE METABOLIC PANEL
AST: 15 U/L (ref 0–37)
BUN: 5 mg/dL — ABNORMAL LOW (ref 6–23)
CO2: 36 mEq/L — ABNORMAL HIGH (ref 19–32)
Chloride: 84 mEq/L — ABNORMAL LOW (ref 96–112)
Creatinine, Ser: <0.47 mg/dL — ABNORMAL LOW (ref 0.50–1.35)
Total Bilirubin: 0.5 mg/dL (ref 0.3–1.2)

## 2011-02-08 DIAGNOSIS — F29 Unspecified psychosis not due to a substance or known physiological condition: Secondary | ICD-10-CM

## 2011-02-08 LAB — BASIC METABOLIC PANEL
BUN: 5 mg/dL — ABNORMAL LOW (ref 6–23)
Calcium: 7.7 mg/dL — ABNORMAL LOW (ref 8.4–10.5)
Creatinine, Ser: <0.47 mg/dL — ABNORMAL LOW (ref 0.50–1.35)
Glucose, Bld: 65 mg/dL — ABNORMAL LOW (ref 70–99)

## 2011-02-08 LAB — CBC
HCT: 35 % — ABNORMAL LOW (ref 39.0–52.0)
Hemoglobin: 11.6 g/dL — ABNORMAL LOW (ref 13.0–17.0)
MCH: 27.8 pg (ref 26.0–34.0)
MCHC: 33.1 g/dL (ref 30.0–36.0)
MCV: 83.9 fL (ref 78.0–100.0)
RDW: 14.6 % (ref 11.5–15.5)

## 2011-02-08 LAB — DIFFERENTIAL
Eosinophils Relative: 8 % — ABNORMAL HIGH (ref 0–5)
Lymphocytes Relative: 23 % (ref 12–46)
Monocytes Absolute: 0.3 10*3/uL (ref 0.1–1.0)
Monocytes Relative: 6 % (ref 3–12)
Neutro Abs: 3.3 10*3/uL (ref 1.7–7.7)

## 2011-02-09 LAB — COMPREHENSIVE METABOLIC PANEL
Albumin: 2.2 g/dL — ABNORMAL LOW (ref 3.5–5.2)
Alkaline Phosphatase: 47 U/L (ref 39–117)
BUN: 7 mg/dL (ref 6–23)
Potassium: 3 mEq/L — ABNORMAL LOW (ref 3.5–5.1)
Total Protein: 5.2 g/dL — ABNORMAL LOW (ref 6.0–8.3)

## 2011-02-09 LAB — DIFFERENTIAL
Basophils Absolute: 0 10*3/uL (ref 0.0–0.1)
Basophils Relative: 1 % (ref 0–1)
Eosinophils Absolute: 0.2 10*3/uL (ref 0.0–0.7)
Eosinophils Relative: 5 % (ref 0–5)
Lymphocytes Relative: 15 % (ref 12–46)
Monocytes Absolute: 0.2 10*3/uL (ref 0.1–1.0)

## 2011-02-09 LAB — MAGNESIUM: Magnesium: 1.9 mg/dL (ref 1.5–2.5)

## 2011-02-09 LAB — CBC
HCT: 33.2 % — ABNORMAL LOW (ref 39.0–52.0)
MCHC: 31.9 g/dL (ref 30.0–36.0)
Platelets: 148 10*3/uL — ABNORMAL LOW (ref 150–400)
RDW: 14.8 % (ref 11.5–15.5)

## 2011-02-10 ENCOUNTER — Inpatient Hospital Stay (HOSPITAL_COMMUNITY): Payer: Medicare Other

## 2011-02-10 DIAGNOSIS — S21209A Unspecified open wound of unspecified back wall of thorax without penetration into thoracic cavity, initial encounter: Secondary | ICD-10-CM

## 2011-02-10 DIAGNOSIS — S21109A Unspecified open wound of unspecified front wall of thorax without penetration into thoracic cavity, initial encounter: Secondary | ICD-10-CM

## 2011-02-10 LAB — VANCOMYCIN, TROUGH: Vancomycin Tr: 16.9 ug/mL (ref 10.0–20.0)

## 2011-02-11 DIAGNOSIS — L0291 Cutaneous abscess, unspecified: Secondary | ICD-10-CM

## 2011-02-11 DIAGNOSIS — L0390 Cellulitis, unspecified: Secondary | ICD-10-CM

## 2011-02-12 LAB — BASIC METABOLIC PANEL
BUN: 6 mg/dL (ref 6–23)
CO2: 33 mEq/L — ABNORMAL HIGH (ref 19–32)
Chloride: 104 mEq/L (ref 96–112)
Creatinine, Ser: 0.9 mg/dL (ref 0.50–1.35)
GFR calc Af Amer: >60 mL/min (ref >60)
Potassium: 2.7 mEq/L — CL (ref 3.5–5.1)

## 2011-02-12 LAB — CBC
HCT: 32.6 % — ABNORMAL LOW (ref 39.0–52.0)
Hemoglobin: 10.4 g/dL — ABNORMAL LOW (ref 13.0–17.0)
WBC: 3.6 10*3/uL — ABNORMAL LOW (ref 4.0–10.5)

## 2011-02-13 LAB — BASIC METABOLIC PANEL
CO2: 31 mEq/L (ref 19–32)
Calcium: 8.1 mg/dL — ABNORMAL LOW (ref 8.4–10.5)
Chloride: 107 mEq/L (ref 96–112)
Creatinine, Ser: 0.95 mg/dL (ref 0.50–1.35)
Glucose, Bld: 91 mg/dL (ref 70–99)
Sodium: 143 mEq/L (ref 135–145)

## 2011-02-14 DIAGNOSIS — F29 Unspecified psychosis not due to a substance or known physiological condition: Secondary | ICD-10-CM

## 2011-02-14 LAB — CULTURE, BLOOD (ROUTINE X 2)
Culture  Setup Time: 201209260420
Culture: NO GROWTH

## 2011-02-15 LAB — BASIC METABOLIC PANEL
BUN: 15 mg/dL (ref 6–23)
CO2: 34 mEq/L — ABNORMAL HIGH (ref 19–32)
Calcium: 8.2 mg/dL — ABNORMAL LOW (ref 8.4–10.5)
Chloride: 102 mEq/L (ref 96–112)
Creatinine, Ser: 0.98 mg/dL (ref 0.50–1.35)
Glucose, Bld: 95 mg/dL (ref 70–99)

## 2011-02-15 LAB — DIFFERENTIAL
Basophils Relative: 0 % (ref 0–1)
Eosinophils Absolute: 0.2 10*3/uL (ref 0.0–0.7)
Eosinophils Relative: 4 % (ref 0–5)
Lymphocytes Relative: 19 % (ref 12–46)
Neutro Abs: 4.1 10*3/uL (ref 1.7–7.7)
Neutrophils Relative %: 69 % (ref 43–77)

## 2011-02-15 LAB — CBC
HCT: 33.6 % — ABNORMAL LOW (ref 39.0–52.0)
MCH: 27.7 pg (ref 26.0–34.0)
MCV: 85.3 fL (ref 78.0–100.0)
RBC: 3.94 MIL/uL — ABNORMAL LOW (ref 4.22–5.81)
WBC: 5.9 10*3/uL (ref 4.0–10.5)

## 2011-02-15 NOTE — Consult Note (Signed)
Kevin Whitney, Kevin Whitney NO.:  [id number]  MEDICAL RECORD NO.:  [id number]  LOCATION:  1321                         FACILITY:  [facility]  PHYSICIAN:  Gardiner Barefoot, MD    DATE OF BIRTH:  Oct 13, 1960  DATE OF CONSULTATION: DATE OF DISCHARGE:                                CONSULTATION   REFERRING PHYSICIAN:  Hind IEda Paschal, MD  REASON FOR CONSULTATION:  Management of decubitus ulcer.  HISTORY OF PRESENT ILLNESS:  This is a 50 year old male with a history of paraplegia, who had come in by EMS due to a decubitus ulcer that he has had for several weeks.  He does state that he has had pain for many years, but otherwise he does not answer questions directly.  The parents are at the bedside and do state that he has not, to their knowledge, had any signs of fever or chills recently and no changes really in him over the last several weeks.  The patient is bedbound and has had a wound of his thorax area before.  He does have a history of MSSA wound cultures back in 2003.  The patient does appear to have poor care.  He does have chronic pain for which he does use a fentanyl patch.  Of note, the patient does use inappropriate things for care of his ulcer including Lysol spray.  Otherwise, history and review of systems from the patient is difficult to obtain.  PAST MEDICAL HISTORY: 1. History of back surgeries and now paraplegia. 2. Chronic low back pain. 3. Hypertension.  MEDICATIONS AT HOME:  Include; 1. Diazepam 10 mg daily. 2. Cyclophosphamide 10 mg daily. 3. Atenolol 25 mg daily. 4. Fentanyl 75 mcg every 48 hours. 5. Antibiotics include vancomycin and Zosyn.  ALLERGIES:  KEFLEX of an unknown reaction.  SOCIAL HISTORY:  The patient does live at home with his parents and is on disability.  He is a smoker, but denies alcohol or drugs.  FAMILY HISTORY:  No history of immune deficiency.  REVIEW OF SYSTEMS:  This was unobtainable from the patient.  PHYSICAL  EXAMINATION:  VITAL SIGNS:  T-max is 100.2, T-current is 98.5, pulse is 81, respirations 18, blood pressure is 107/77, O2 saturation is 97% on room air. GENERAL:  The patient is awake and alert, and appears in no acute distress. CARDIOVASCULAR:  Regular rate and rhythm.  No murmurs, rubs, or gallops. LUNGS:  Clear to auscultation bilaterally. ABDOMEN:  Soft, nontender, nondistended.  Positive bowel sounds.  No hepatosplenomegaly. EXTREMITIES:  With no edema.  Wound is stage II and no signs of infection by report, I was unable to take down the bandage.  LABORATORY DATA:  Labs include blood cultures negative x2.  Last WBC count was 3.8, hemoglobin 10.6, and platelets 148.  Creatinine is less than 0.47.  ASSESSMENT:  Decubitus ulcer and mild cellulitis.  RECOMMENDATIONS:  Since this has some mild cellulitis by report, but does have good granulation tissue and no signs of infection, no abscess, and negative findings on the MRI for osteomyelitis or other concerns for infection, there is no indication for IV therapy.  He has had MSSA infections in the past, but  I do not see any history of MRSA, however, he is isolated for that and therefore, I will have him start on p.o. therapy with clindamycin for the cellulitis, that will also cover MRSA due to a report of a history of MRSA; also in light of his Keflex allergy.  We would recommend he take that for a few days and then can stop.  I will otherwise stop his IV antibiotics at this time.  Thank you for the consult and please call for any further issues or changes.     Gardiner Barefoot, MD     RWC/MEDQ  D:  02/11/2011  T:  02/11/2011  Job:  [id number]  Electronically Signed by Staci Righter MD on 02/15/2011 03:03:01 PM

## 2011-02-17 DIAGNOSIS — F29 Unspecified psychosis not due to a substance or known physiological condition: Secondary | ICD-10-CM

## 2011-02-17 LAB — BASIC METABOLIC PANEL
BUN: 14 mg/dL (ref 6–23)
CO2: 32 mEq/L (ref 19–32)
Chloride: 102 mEq/L (ref 96–112)
GFR calc non Af Amer: >90 mL/min (ref >90)
Glucose, Bld: 94 mg/dL (ref 70–99)
Potassium: 2.9 mEq/L — ABNORMAL LOW (ref 3.5–5.1)
Sodium: 139 mEq/L (ref 135–145)

## 2011-02-17 LAB — NA AND K (SODIUM & POTASSIUM), RAND UR: Potassium Urine: 23 mEq/L

## 2011-02-18 DIAGNOSIS — F29 Unspecified psychosis not due to a substance or known physiological condition: Secondary | ICD-10-CM

## 2011-02-18 LAB — BASIC METABOLIC PANEL
CO2: 30 mEq/L (ref 19–32)
Calcium: 7.8 mg/dL — ABNORMAL LOW (ref 8.4–10.5)
Chloride: 104 mEq/L (ref 96–112)
Potassium: 4.5 mEq/L (ref 3.5–5.1)
Sodium: 137 mEq/L (ref 135–145)

## 2011-02-19 LAB — PHOSPHORUS: Phosphorus: 2.7 mg/dL (ref 2.3–4.6)

## 2011-02-19 LAB — BASIC METABOLIC PANEL
Chloride: 99 mEq/L (ref 96–112)
GFR calc Af Amer: >90 mL/min (ref >90)
GFR calc non Af Amer: >90 mL/min (ref >90)
Glucose, Bld: 80 mg/dL (ref 70–99)
Potassium: 4.5 mEq/L (ref 3.5–5.1)
Sodium: 136 mEq/L (ref 135–145)

## 2011-02-19 LAB — CBC
HCT: 34.6 % — ABNORMAL LOW (ref 39.0–52.0)
Platelets: 168 10*3/uL (ref 150–400)
RBC: 4.08 MIL/uL — ABNORMAL LOW (ref 4.22–5.81)
RDW: 16.9 % — ABNORMAL HIGH (ref 11.5–15.5)
WBC: 7.5 10*3/uL (ref 4.0–10.5)
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
## 2011-03-01 NOTE — Discharge Summary (Signed)
NAMESARGON, Kevin Whitney                ACCOUNT NO.:  [id number]  MEDICAL RECORD NO.:  [id number]  LOCATION:  1321                         FACILITY:  [facility]  PHYSICIAN:  Iara Monds I Mayan Dolney, MD      DATE OF BIRTH:  06-Jun-1960  DATE OF ADMISSION:  02/07/2011 DATE OF DISCHARGE:                              DISCHARGE SUMMARY   DISCHARGE DIAGNOSES: 1. Decubitus ulcer, with left thigh wound, and left scapulothoracic     ulcer, 50% necrotic with loose hanging eschar, but no evidence of     infection. 2. History of paraplegia/lower extremity weakness after back surgery. 3. History of back surgery, lower back pain status post surgery. 4. Hypokalemia. 5. Anxiety disorder, psychologic.  CONSULTATIONS:  Surgical consulted, Infectious Disease consulted, and Wound Care consulted.  DISCHARGE MEDICATIONS: 1. Abilify 2 mg p.o. q.h.s. 2. Atenolol 25 mg p.o. daily. 3. Clindamycin 300 mg p.o. q.6 hours for 10 days. 4. Flora-Q one tablet twice daily.5. Ensure 237 3 times daily. 6. Fentanyl patch 125 mcg transdermal q.72 hours. 7. Potassium chloride 10 mEq p.o. daily. 8. Flexeril 10 mg p.o. q.8 hours as needed. 9. Diazepam 10 mg p.o. q.8 hours as needed.  HISTORY OF PRESENT ILLNESS:  This is a 50 year old male with history of paraplegia, brought by EMS because of slight decubitus ulcer developed over the last week.  The patient is not really a good historian secondary to his mental illness.  History of pain mainly from the history of present illness from the ED and EMS.  For the last 4 months, the patient has limited mobility with some paraplegia, no extremity weakness mainly after back surgery dated back to 2003.  He developed large thoracic ulcer in the left side of the thoracic after surgical evaluation with some foul odor, and some moderate to severe pain.  PHYSICAL EXAMINATION:  On evaluation in the emergency room, VITAL SIGNS:  Temperature 98.9, blood pressure 125/85, pulse rate 95, and oxygen  saturation 100%.  LABORATORY DATA:  White blood cell count on admission 8.6, hemoglobin 13.9, hematocrit 41.5, and platelets 222.  Sodium 134, potassium 2.5, chloride 84, BUN 5, and creatinine less than 0.47.  Albumin 3.  The patient was admitted, started on broad-spectrum antibiotics Zosyn and vancomycin, and accordingly Wound Care consulted and surgical consultation done, but surgical team, they did not feel there is any evidence of infection, and accordingly debridement was not required. The patient remained on hydrotherapy for the above large thoracic and sacral ulcer.  During hospital stay and secondary to his mental status, Psychiatric consulted.  I recommended Abilify and the patient was started on this.  The patient is having slight phobia and you cannot obtain any further history from him.  The patient would like to be discharged home.  Psychiatric will be on board to evaluate competency to make his own decision.  Discussed the case with his parents who would like the patient to go home.  From medical standpoint, the patient is medically stable to be discharged home with wound care versus nursing home/__________ hydrotherapy.  Case Management working on placement.     Kevin Dembeck Bosie Helper, MD     HIE/MEDQ  D:  02/13/2011  T:  02/13/2011  Job:  [id number]  Electronically Signed by Ebony Cargo MD on 03/01/2011 02:39:35 PM

## 2011-03-06 NOTE — Consult Note (Signed)
Kevin Whitney, Kevin Whitney NO.:  [id number]  MEDICAL RECORD NO.:  [id number]  LOCATION:  1321                         FACILITY:  [facility]  PHYSICIAN:  Sandria Bales. Ezzard Standing, M.D.  DATE OF BIRTH:  [birth year]  DATE OF CONSULTATION:  02/10/2011                                CONSULTATION   REQUESTING PHYSICIAN:  Hind Bosie Helper, MD  CONSULTING SURGEON:  Sandria Bales. Ezzard Standing, MD  REASON FOR CONSULTATION:  Left back wound.  HISTORY OF PRESENT ILLNESS:  Kevin Whitney is a 50 year old gentleman, who has a history of paraplegia secondary to some complicated back problems dating back to 2003, leaving him as paraplegia.  The patient after reviewing the history and physical by the admitting physician apparently is unreliable with regard to history.    Apparently, he has had a left-sided flank/chest wall wound with necrosis extending down to his ribs. This has been present for several weeks, though the patient tells me this has been going on "for years."  He has chronic pain control by primary care physician in [place].  He denies any fever, chills, shortness of breath, or labored breathing.  He denies any trauma.  Apparently, he has been trying to keep care of this by himself by using home tools and using Lysol spray.  His parents gave some additional history of some of his habits at home.  However, he has now been admitted as there is concern for infection of this wound.  Physical Therapy had already been consulted and has done some type of pulse lavage and hydrotherapy.  Apparently, there is report of a large piece of necrotic material or tissue that has already been removed from the wound during the hydrotherapy.  However, surgical consult is requested to rule out deep abscess and need for further surgical debridement.  PAST MEDICAL HISTORY:  Includes again chronic paraplegia secondary to complications of back problems.  SURGICAL HISTORY:  Includes multiple back  procedures.  SOCIAL HISTORY:  The patient lives at home with his parents.  He has been on disability due to his paraplegia.  Smokes about half pack of cigarettes a day.  Denies any alcohol or illicit drug use.  ALLERGIES:  No known drug or latex allergies.  MEDICATIONS:  Include: 1. Diazepam. 2. Cyclophosphamide. 3. Atenolol. 4. Fentanyl patches.  FAMILY HISTORY:  Noncontributory to present case.  REVIEW OF SYSTEMS:  Please see history of present illness for pertinent findings.  PHYSICAL EXAMINATION:  GENERAL:  Reveals a 50 year old gentleman, who is nontoxic-appearing. VITAL SIGNS:  Current vital signs show a temperature of 98.4, heart rate of 84, respiratory rate of 18, blood pressure of 90/60, oxygen saturation 97% on room air. ENT:  Unremarkable. NECK:  Supple without lymphadenopathy. CHEST:  Clear to auscultation.  No wheezes, rhonchi, or rales.  On the left lateral to posterior chest wall, there is a large wound measuring about 18 x 12 cm.  The wound itself actually looks fairly clean.  There is periwound erythema or induration to suggest infection.  There is no fluctuance to suggest an adjacent abscess.  Majority of the wound is healthy granulation tissue.  I am able to palpate down over the ribs,  but not directly onto the bone.  There is some loose fibrinous exudate material and an undermining trach that goes medially, but there are no deep abscess pockets or components to get into.  Additionally noted is a very small stage II sacral decubitus wound without evidence of infection. HEART:  Regular rate and rhythm.  No murmurs, gallops, or rubs.  The abdomen is soft, nondistended, nontender. EXTREMITIES:  Diminished muscle tone and strength secondary to paraplegia.  Small superficial ulcers and wounds of the lower extremities without evidence of acute infection.  LABORATORY DATA:  Labs yesterday showed a white blood cell count of 3.8, hemoglobin of 10.6, hematocrit of  33.2, platelet count of 148. Metabolic panel shows a sodium of 139, potassium of 3.0, chloride of 98, CO2 of 36, BUN of 7, creatinine of 0.47, glucose of 83, albumin depleted at 2.2.  Blood cultures thus far negative.  No formal imaging with the exception of a chest x-ray has been performed.  There is no evidence of obvious subcutaneous emphysema.  IMPRESSION: 1. 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 2. Chronic paraplegia. 3. Probable protein calorie malnutrition.  RECOMMENDATIONS:  The wound actually looks clean and I do not feel that there is need for surgical debridement at present time.  I am not suspicious of any deep infection or abscess cavity, but an MRI has been ordered for further evaluation.    At the present time, we would recommend continued antibiotics and continued hydrotherapy to facilitate wound cleaning.  Ideally as this wound continues to clean up, he could be a candidate for wound VAC and eventual split-thickness skin graft; however, I am not sure how compliant he would be.  There is a hint of history of poor hygiene and possible things that may be detrimental to his wound and a VAC or a split-thickness skin graft would be significantly damaged by the patient spraying household products such as Lysol on the wound.    In the meantime, I think he would need appropriate nutritional evaluation prior to discharge home and recommend followup with an outpatient wound care center who could continue followup of this chronic wound.   Brayton El, PA-C   Sandria Bales. Ezzard Standing, M.D., FACS   KB/MEDQ  D:  02/10/2011  T:  02/10/2011  Job:  [id number]  cc:   Hind Bosie Helper, MD  Electronically Signed by Brayton El  on 03/01/2011 03:50:55 PM Electronically Signed by Ovidio Kin M.D. on 03/06/2011 10:09:04 PM
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
## 2012-10-04 NOTE — Discharge Summary (Signed)
[facility]                               DISCHARGE SUMMARY              NAME: DERNER, Walter West                  ADMITTED:   07/29/2009       MR#:  [id number]                            DISCHARGED: 08/03/2009       DOB:  Dec 29, 1960                           ACCT#:      [id number]       _________________________________________________________________       FINAL DIAGNOSES:       1.  Acute and chronic ruptured appendicitis.       2.  Diabetes mellitus.              OPERATIONS:       On 07/30/2009, open appendectomy with dissection of phlegmon,       removal of appendix, including Endo GIA staple of the cecum,       placement of drains, skin wound closure over a Penrose drain.              HISTORY OF PRESENT ILLNESS:       This is a somewhat complicated history in this 52 year old male,       who has been ill for about a week.  He was referred from the       emergency room in [facility]  He is       diabetic, although not actively taking his diabetic medications.       He was found on CT to have a marked phlegmon in the right lower       quadrant, and referred to surgery.              We had originally considered a trial of nonoperative management       with antibiotics, etc., and then, if he calmed down, an interval       appendectomy, but he remained tender.  He was very anxious about       surgery, wanting to proceed with surgery on the day following       admission.              LABORATORY DATA:       Lab work on admission:  White count was 8800, hemoglobin 12.6,       hematocrit 37.2 and no bands.              His admitting glucose 171.  Electrolytes 136, 3.4, 99, 29.8, BUN       11, creatinine 0.88 and calcium 8.8.              HOSPITAL COURSE:       On the morning following admission, we proceeded to the       operating room and explored through a right lower quadrant       transverse incision, [place] type incision.  There was a       large  phlegmon, a large centralized abscess.  This was all  broken up.  Fluid obtained for culture.  Copious irrigation.  We       ultimately performed appendectomy, taking a GIA Endo stapler       across the cecum, at the base of the appendix.  Ultimately, we       were able to close, leaving the J-P drain in the pelvis and a       Penrose drain in the subcuticular.              Post-op, he was maintained on Zosyn antibiotics.  His culture       demonstrates moderate growth of Bacteroides fragilis, also E.       coli with multiple sensitivities, and a strep viridans.              He made satisfactory progress in diet and activity, and was       ultimately ready for discharge on 03/22.              Note that his blood sugar control has improved.  He was       maintained on subcutaneous insulin and q.6 h. Chem-strips.       Also, his oral agents were restarted, which he had not been       using at home, including glyburide 5 mg p.o. daily with              breakfast and metformin 500 mg p.o. b.i.d. with breakfast and       supper.              Ultimately, the Penrose drain was removed.  The subcutaneous       drain was removed, and then the deeper drain was removed, and       the deeper Blake drain was removed.  Staples remained in place.       His blood sugar appeared to be under reasonable control.  He was       discharged.              DISCHARGE MEDICATIONS:       To continue with:              1.  Glyburide.       2.  Metformin.       3.  Percocet p.r.n. for pain.       4.  Flagyl 500 mg p.o. q.i.d.       5.  Cipro 500 mg p.o. b.i.d.       6.  He will continue his lisinopril 10 milligrams p.o. daily.              FOLLOWUP:       I will see him back in the office in 2 days, to remove the       remainder of his staples and check his wound.                            **PRELIMINARY REPORT UNLESS SIGNED**       SEE DOCUMENT IMAGING SYSTEM FOR FINAL REPORT                                  ________________________________  Rocco Serene, MD                                            ID:   [id number]       DocType:   [id number]       \:   AP       /:   263       DD:  08/04/2009 14:43:44       DT:  08/05/2009 08:43:44       JOB: [id number]            Dr. Myer Peer ([id number])       >  Authenticated by Vern Claude, MD On 08/06/2009 05:19:51 PM

## 2012-10-04 NOTE — Op Note (Signed)
[facility]                           OPERATIVE/PROCEDURE REPORT              NAME: Walter, West                  ADMITTED:   07/29/2009       MR#:  [id number]                            ACCT#:      [id number]       DOB:  1960-08-31       _________________________________________________________________       DATE OF SURGERY:       07/30/2009              PREOPERATIVE DIAGNOSIS:       Chronic ruptured appendicitis.              POSTOPERATIVE DIAGNOSIS:       Chronic ruptured appendicitis.              OPERATION:       Open appendectomy with dissection of phlegmon, removal of       appendix, including Endo-GIA staple of the cecum, placement of       drains. Skin wound closed over a Penrose drain.              BRIEF HISTORY:       This is a somewhat complicated history in this 52 year old male       who has been ill for about a week.  He was referred from the       emergency room physician in [place] at [facility].              He was found on CT to have marked phlegmon in the right lower       quadrant and was referred to surgery.              We had originally considered a trial of nonoperative management       with antibiotics, etc.  If he calmed down, then an interval       appendectomy but he remained tender, was very anxious about       surgery, and we proceed with surgery on the day following       admission.              DESCRIPTION OF OPERATION:       After adequate endotracheal general anesthesia had been       obtained, the Foley catheter was placed.  The abdomen was       prepped with ChloraPrep  solution and draped sterilely.              A right lower quadrant transverse incision was utilized with the       rectus muscle being partially divided.              On entering the abdominal cavity, there was a significant       phlegmon in the right lower quadrant.  We get into a pus-filled       cavity.  Cultures were obtained, about 10 cc of  pus-like fluid       is obtained for culture.  We copiously irrigate.  We break up pockets.  We ultimately with       tedious dissection completely free up the large bulky retrocecal       retroperitoneal appendix which was ultimately freed up, taking       down ______ and/or multiple hemoclips until we get the appendix       totally freed up to its base.  At the base, the cecum is       actually reasonably supple.              We come across the base with a 45 mm Endo-GIA blue cartridge       stapler which produces a nice staple line and removed the       appendix entirely.                     That stapled line has good hemostasis.  We imbricate this with       interrupted sutures of 3-0 silk.              Next, we copiously irrigated with antibiotic solution and a       Blake drain in place, and I close the fascia with a running 0       PDS in the posterior rectus sheath and peritoneum and external       and internal oblique fascia.              The anterior rectus sheath was closed with interrupted       figure-of-eight sutures of #1 PDS.  Subcu is closed with 3-0       Vicryl with a Penrose drain.  ______ staples. The patient       tolerated the procedure well, and left the operating room in       stable condition.              ESTIMATED BLOOD LOSS:       50 cc.                            **PRELIMINARY REPORT UNLESS SIGNED**       SEE DOCUMENT IMAGING SYSTEM FOR FINAL REPORT                                 ________________________________                              Rocco Serene, MD                                            ID:   [id number]       DocType:   [id number]       \:   DM       /:   263       DD:  07/30/2009 16:13:20       DT:  07/31/2009 16:29:41       JOB: [id number]       >  Authenticated by Vern Claude, MD On 08/06/2009 05:19:27 PM

## 2015-11-02 ENCOUNTER — Encounter: Payer: Self-pay | Admitting: Pain Medicine

## 2015-11-02 ENCOUNTER — Ambulatory Visit: Payer: Medicare Other | Attending: Pain Medicine | Admitting: Pain Medicine

## 2015-11-02 VITALS — BP 123/73 | HR 113 | Temp 98.0°F | Resp 16 | Ht 73.0 in | Wt 165.0 lb

## 2015-11-02 DIAGNOSIS — M419 Scoliosis, unspecified: Secondary | ICD-10-CM | POA: Diagnosis not present

## 2015-11-02 DIAGNOSIS — Z993 Dependence on wheelchair: Secondary | ICD-10-CM | POA: Insufficient documentation

## 2015-11-02 DIAGNOSIS — M545 Low back pain: Secondary | ICD-10-CM | POA: Diagnosis present

## 2015-11-02 DIAGNOSIS — R531 Weakness: Secondary | ICD-10-CM | POA: Diagnosis not present

## 2015-11-02 DIAGNOSIS — A4902 Methicillin resistant Staphylococcus aureus infection, unspecified site: Secondary | ICD-10-CM | POA: Insufficient documentation

## 2015-11-02 DIAGNOSIS — M47816 Spondylosis without myelopathy or radiculopathy, lumbar region: Secondary | ICD-10-CM

## 2015-11-02 DIAGNOSIS — R569 Unspecified convulsions: Secondary | ICD-10-CM | POA: Diagnosis not present

## 2015-11-02 DIAGNOSIS — M5134 Other intervertebral disc degeneration, thoracic region: Secondary | ICD-10-CM | POA: Insufficient documentation

## 2015-11-02 DIAGNOSIS — G4700 Insomnia, unspecified: Secondary | ICD-10-CM | POA: Insufficient documentation

## 2015-11-02 DIAGNOSIS — Z9889 Other specified postprocedural states: Secondary | ICD-10-CM | POA: Insufficient documentation

## 2015-11-02 DIAGNOSIS — M546 Pain in thoracic spine: Secondary | ICD-10-CM | POA: Diagnosis present

## 2015-11-02 DIAGNOSIS — M797 Fibromyalgia: Secondary | ICD-10-CM | POA: Diagnosis not present

## 2015-11-02 DIAGNOSIS — M5136 Other intervertebral disc degeneration, lumbar region: Secondary | ICD-10-CM | POA: Diagnosis not present

## 2015-11-02 DIAGNOSIS — M533 Sacrococcygeal disorders, not elsewhere classified: Secondary | ICD-10-CM | POA: Insufficient documentation

## 2015-11-02 DIAGNOSIS — M79606 Pain in leg, unspecified: Secondary | ICD-10-CM | POA: Diagnosis present

## 2015-11-02 DIAGNOSIS — K219 Gastro-esophageal reflux disease without esophagitis: Secondary | ICD-10-CM | POA: Diagnosis not present

## 2015-11-02 NOTE — Progress Notes (Signed)
Safety precautions to be maintained throughout the outpatient stay will include: orient to surroundings, keep bed in low position, maintain call bell within reach at all times, provide assistance with transfer out of bed and ambulation.  

## 2015-11-02 NOTE — Progress Notes (Signed)
Subjective:    Patient ID: Kevin SimsDavid Whitney, male    DOB: June 22, 1960, 55 y.o.   MRN: [id number]  HPI  The patient is a 55 year old gentleman who comes to pain management at the request of Dr. Jerl MinaJames Hedrick for further evaluation and treatment of pain involving the region of the upper mid lower back and lower extremity regions. The patient is status post surgical intervention of the spine with placement of rods with complication of MRSA infection. The patient continues to undergo treatments of the MRSA infection of the spine. The patient states that he had been using the fentanyl patch 75 g with 75 g patches applied to the skin every 2 days. At the present time patient states that he is wearing only one 75 g patch. The patient states that his pain is without significant relief with the present treatment regimen which includes other medications as well. The patient described his pain as aching agonizing annoying burning constant cramping Crowell deep disabling distressing. We'll exhausting fearful feeling of weight getting longer heavy horrible hot nagging pressure-like pulsating punishing sharp spitting sickening stabbing throbbing tingling tiring toothache-like uncomfortable sensation that awakens patient from sleep and interferes with patient ability to go to sleep. The patient states the pain increases with motion sitting twisting working bowel movements and that surgery made the pain worse. The patient states the pain has decreased with resting lying down and medications. The patient has a history of convulsions and is confined to wheelchair and has minimal strength of the lower extremities. We discussed patient's condition on today's visit and after evaluation of patient and assessment of patient's condition and decision was made to have patient return to consider referral to [facility] for further assessment of his condition and recommendations regarding treatment. The patient will follow-up  with Dr. Burnett ShengHedrick to discuss referral to [facility]. All agreed to suggested treatment plan.  Review of Systems   Cardiovascular: Unremarkable   Pulmonary: Smoker  Neurological: Convulsions Scoliosis  Psychological: Insomnia  Gastrointestinal: Unremarkable  Genitourinary: Gastroesophageal reflux disease  Hematologic: Unremarkable  Endocrine: Unremarkable  Rheumatological: Arthritis Fibromyalgia  Musculoskeletal: Unremarkable  Other significant: Unremarkable      Objective:   Physical Exam  There was tenderness to palpation of the paraspinal musculature region of the cervical region of moderate degree with moderate tenderness of the splenius capitis and occipitalis regions. Palpation over the region of the thoracic facet thoracic paraspinal musculature region was with moderate to moderately severe tenderness to palpation. The patient was with wound of the left upper thoracic region. The patient undergoes treatment of the wound every 3 weeks. The patient is status post surgery of spine, located by MRSA infection. Tenderness to palpation of the acromioclavicular and glenohumeral joint regions a moderate degree and patient was with workable Spurling's maneuver. There was severe increase of pain with Tinel and Phalen's maneuver of the upper extremities. Well-healed surgical scars of the upper extremities noted There was tenderness to palpation over the region of the lower thoracic paraspinal musculatures and a moderately severe degree. Palpation over the lumbar paraspinal musculatures and lumbar facet region was with moderate to moderately severe discomfort as well there was tenderness over the PSIS and PII S region a moderate degree. Palpation over the greater trochanteric region iliotibial band region reproduced mild discomfort. There was severely decreased motor strength of the lower extremities. With minimal EHL strength and with straight leg raising tolerates  approximately 20 without increase of pain with dorsiflexion noted. There was significant  muscle wasting of the lower extremities noted no increased warmth erythema of the lower extremities were noted. The knees were with crepitus of the knees with severe increased pain with range of motion maneuvers of the knees. There was negative Homans. No significant abdominal tends to palpation and no costovertebral angle tenderness noted.      Assessment & Plan:     Degenerative disc disease of the thoracic spine Status post surgical intervention thoracic spine  Degenerative disc disease lumbar spine  Status post surgical intervention of the lumbar spine  MRSA infection  Severe lower extremity weakness  Convulsions  Status post surgery of left and right upper extremities     PLAN  Continue present medications  F/U PCP Dr. Burnett Sheng for evaluation of  BP and general medical  condition. Also discussed referral to [facility] with Dr.  F/U surgical evaluation. We will avoid considering surgical evaluation at this time as discussed  F/U Dr. Lovell Sheehan for neurosurgical reevaluation  F/U Orthopedic evaluation  F/U Infectious disease evaluation and treatment  F/U neurological evaluation. We will avoid neurological evaluation at this time as discussed  Patient to follow up with Dr. Lennice Sites to discuss referral to [facility].

## 2015-11-02 NOTE — Patient Instructions (Addendum)
PLAN  Continue present medications  F/U PCP Dr. Burnett ShengHedrick for evaluation of  BP and general medical  condition. Also discussed referral to [facility] with Dr.  F/U surgical evaluation. We will avoid considering surgical evaluation at this time as discussed  F/U neurological evaluation. We will avoid neurological evaluation at this time as discusse  Patient to follow up with Dr. Lennice SitesHetrick to discuss referral to [facility].

## 2017-07-11 ENCOUNTER — Emergency Department
Admission: EM | Admit: 2017-07-11 | Discharge: 2017-07-11 | Payer: PRIVATE HEALTH INSURANCE | Attending: Emergency Medicine | Admitting: Emergency Medicine

## 2017-07-11 ENCOUNTER — Emergency Department: Payer: PRIVATE HEALTH INSURANCE

## 2017-07-11 DIAGNOSIS — E1165 Type 2 diabetes mellitus with hyperglycemia: Secondary | ICD-10-CM

## 2017-07-11 DIAGNOSIS — Z9119 Patient's noncompliance with other medical treatment and regimen: Secondary | ICD-10-CM | POA: Insufficient documentation

## 2017-07-11 DIAGNOSIS — R739 Hyperglycemia, unspecified: Secondary | ICD-10-CM

## 2017-07-11 DIAGNOSIS — N289 Disorder of kidney and ureter, unspecified: Secondary | ICD-10-CM | POA: Insufficient documentation

## 2017-07-11 DIAGNOSIS — R4182 Altered mental status, unspecified: Secondary | ICD-10-CM

## 2017-07-11 DIAGNOSIS — Z91199 Patient's noncompliance with other medical treatment and regimen due to unspecified reason: Secondary | ICD-10-CM

## 2017-07-11 DIAGNOSIS — F1910 Other psychoactive substance abuse, uncomplicated: Secondary | ICD-10-CM

## 2017-07-11 DIAGNOSIS — F19929 Other psychoactive substance use, unspecified with intoxication, unspecified: Secondary | ICD-10-CM

## 2017-07-11 HISTORY — DX: Type 2 diabetes mellitus without complications: E11.9

## 2017-07-11 LAB — VH URINE DRUG SCREEN - NO CONFIRMATION
Amphetamine: POSITIVE — AB
Barbiturates: NEGATIVE
Buprenorphine, Urine: NEGATIVE
Cannabinoids: NEGATIVE
Cocaine: POSITIVE — AB
Methamphetamine: POSITIVE — AB
Opiates: POSITIVE — AB
Phencyclidine: NEGATIVE
Propoxyphene: NEGATIVE
Urine Benzodiazepines: POSITIVE — AB
Urine Methadone Screen: NEGATIVE
Urine Oxycodone: NEGATIVE
Urine Tricyclics: NEGATIVE

## 2017-07-11 LAB — CBC AND DIFFERENTIAL
Basophils %: 0.9 % (ref 0.0–3.0)
Basophils Absolute: 0.1 10*3/uL (ref 0.0–0.3)
Eosinophils %: 0.3 % (ref 0.0–7.0)
Eosinophils Absolute: 0 10*3/uL (ref 0.0–0.8)
Hematocrit: 41.8 % (ref 39.0–52.5)
Hemoglobin: 14.2 gm/dL (ref 13.0–17.5)
Lymphocytes Absolute: 2.3 10*3/uL (ref 0.6–5.1)
Lymphocytes: 18.4 % (ref 15.0–46.0)
MCH: 27 pg — ABNORMAL LOW (ref 28–35)
MCHC: 34 gm/dL (ref 31–36)
MCV: 79 fL — ABNORMAL LOW (ref 80–100)
MPV: 7 fL (ref 6.0–10.0)
Monocytes Absolute: 0.8 10*3/uL (ref 0.1–1.7)
Monocytes: 6.8 % (ref 3.0–15.0)
Neutrophils %: 73.6 % (ref 42.0–78.0)
Neutrophils Absolute: 9.2 10*3/uL — ABNORMAL HIGH (ref 1.7–8.6)
PLT CT: 520 10*3/uL — ABNORMAL HIGH (ref 130–440)
RBC: 5.29 10*6/uL (ref 4.00–5.70)
RDW: 11.3 % (ref 10.5–14.5)
WBC: 12.5 10*3/uL — ABNORMAL HIGH (ref 4.0–11.0)

## 2017-07-11 LAB — VH URINALYSIS WITH MICROSCOPIC
Blood, UA: NEGATIVE
Glucose, UA: 500 mg/dL — AB
Ictotest: NEGATIVE
Ketones UA: 40 mg/dL — AB
Leukocyte Esterase, UA: NEGATIVE Leu/uL
Nitrite, UA: NEGATIVE
Protein, UR: 100 mg/dL — AB
Urine Specific Gravity: 1.025 (ref 1.005–1.030)
Urobilinogen, UA: 0.2 mg/dL
pH, Urine: 6.5 pH (ref 5.0–8.0)

## 2017-07-11 LAB — ACETAMINOPHEN LEVEL: Acetaminophen Level: <0.6 ug/mL (ref 0.0–30.0)

## 2017-07-11 LAB — I-STAT CG4 ARTERIAL CARTRIDGE
BE, ISTAT: -1 mMol/L (ref <=2)
HCO3, ISTAT: 22.4 mMol/L (ref 20.0–29.0)
Lactic Acid I-Stat: 0.78 mMol/L (ref 0.50–2.10)
O2 Sat, %, ISTAT: 97 % (ref 96–100)
PCO2, ISTAT: 32 mm Hg — ABNORMAL LOW (ref 35.0–45.0)
PO2, ISTAT: 82 mm Hg (ref 75–100)
TCO2 I-Stat: 23 mMol/L — ABNORMAL LOW (ref 24–29)
i-STAT FIO2: 21 %
pH, ISTAT: 7.45 (ref 7.35–7.45)

## 2017-07-11 LAB — COMPREHENSIVE METABOLIC PANEL
ALT: 10 U/L (ref 0–55)
AST (SGOT): 8 U/L — ABNORMAL LOW (ref 10–42)
Albumin/Globulin Ratio: 1.03 Ratio (ref 0.70–1.50)
Albumin: 4 gm/dL (ref 3.5–5.0)
Alkaline Phosphatase: 102 U/L (ref 40–145)
Anion Gap: 17.7 mMol/L (ref 7.0–18.0)
BUN / Creatinine Ratio: 13.9 Ratio (ref 10.0–30.0)
BUN: 23 mg/dL — ABNORMAL HIGH (ref 7–22)
Bilirubin, Total: 0.6 mg/dL (ref 0.1–1.2)
CO2: 22.4 mMol/L (ref 20.0–30.0)
Calcium: 10.2 mg/dL (ref 8.5–10.5)
Chloride: 100 mMol/L (ref 98–110)
Creatinine: 1.65 mg/dL — ABNORMAL HIGH (ref 0.80–1.30)
EGFR: 46 mL/min/{1.73_m2} — ABNORMAL LOW (ref 60–150)
Globulin: 3.9 gm/dL (ref 2.0–4.0)
Glucose: 362 mg/dL — ABNORMAL HIGH (ref 71–99)
Osmolality Calc: 289 mOsm/kg (ref 275–300)
Potassium: 5.1 mMol/L (ref 3.5–5.3)
Protein, Total: 7.9 gm/dL (ref 6.0–8.3)
Sodium: 135 mMol/L — ABNORMAL LOW (ref 136–147)

## 2017-07-11 LAB — SALICYLATE LEVEL: Salicylate Level: <5 mg/dL (ref 0.0–30.0)

## 2017-07-11 LAB — TROPONIN I: Troponin I: <0.01 ng/mL (ref 0.00–0.02)

## 2017-07-11 LAB — ETHANOL: Alcohol: <10 mg/dL (ref 0–9)

## 2017-07-11 NOTE — ED Triage Notes (Signed)
Patient arrived from jail; he had just arrived at jail and on admission seemed to be "normal" but within about 15 minutes of arrival became confused with changes in his mental status.

## 2017-07-11 NOTE — ED Notes (Signed)
After multiple attempts to obtain urine and patient making inappropriate comments as to how we could get his urine order received to perform in and out cath.

## 2017-07-11 NOTE — ED Notes (Signed)
PHRJ notified of patients return and report given to [facility] in Medical.

## 2017-07-11 NOTE — ED Notes (Signed)
Patient provided with a urinal at the bedside and instructed that we needed a urine specimen.

## 2017-07-11 NOTE — Discharge Instructions (Signed)
Chronic Kidney Disease (CKD)    The role of the kidneys is to remove waste products and extra water from the blood. When the kidneys do not work as they should, waste products begin to build up in the blood. This is called chronic kidney disease (CKD). CKD means that you have kidney damage or a decrease in kidney function lasting at least 3 months. CKD allows extra water, waste, and toxins to build up in the body. This can eventually become life-threatening. You might need dialysis or a kidney transplant to stay alive. This most severe form is called end stage renal disease.  Diabetes is the leading causes of chronic renal failure. Other causes include high blood pressure, hardening of the arteries (atherosclerosis), lupus, inflammation of the blood vessels (vasculitis), and past viral or bacterial infections. Certain over-the-counter pain medicines can cause renal failure when taken often over a long period of time. These include aspirin, ibuprofen, and related anti-inflammatory medicines called NSAIDs (nonsteroidal anti-inflammatory drugs).  Home care  The following guidelines will help you care for yourself at home:   If you have diabetes, talkwith your healthcare provider about keeping your blood sugar under control. Ask if you need to make and changes to your diet, lifestyle, or medicines.   If you have high blood pressure:   Take prescribed medicine to lower your blood pressure to the recommended goal of less than 130/80.   Start a regular exercise program that you enjoy.Check with your healthcare provider to be sure your planned exercise program is right for you.   Eat less salt (sodium).Your healthcare provider can tell you how much salt per day is safe for you.   If you are overweight, talkwith yourhealthcare providerabout a weight loss plan.   If you smoke, you must quit. Smoking makes kidney disease worse.Talkwith your healthcare provider about ways to help you quit. For more  information, visit the following links:   www.smokefree.gov/sites/default/files/pdf/clearing-the-air-accessible.pdf   www.smokefree.gov   www.cancer.org/healthy/stayawayfromtobacco/guidetoquittingsmoking/   Most people withCKD need to follow a special diet. Be sure you understand yours. In general, you will need to limit protein, salt, potassium, and phosphorus.You also need to limit how much fluid you drink.   CKD is a risk factor for heart disease. Talkwith your healthcare provider about any other risk factors you might have and what you can do to lessen them.   Talkwith your healthcare provider about any medicines you are taking to find out if they need to be reduced or stopped.   Don't use the following over-the-counter medicines, or consult your healthcare provider before using:   Aspirin and NSAIDs such as ibuprofen or naproxen. Using acetaminophen for fever or pain is OK.   Laxatives and antacids containing magnesium or aluminum   Fleet or phospho soda enemas containing phosphorus   Certain stomach acid-blocking medicine such as cimetidine or ranitidine   Decongestants containing pseudoephedrine   Herbal supplements  Follow-up care  Follow up with your healthcare provider, or as advised. Contact one of the following for more information:   American Association of Kidney Patients 800-749-2257 www.aakp.org   National Kidney Foundation 800-622-9010 www.kidney.org   American Kidney Fund 800-638-8299 www.kidneyfund.org   National Kidney Disease Education Program 866-4KIDNEY www.nkdep.nih.gov  If an X-ray, ECG (cardiogram), or other diagnostic test was taken, you will be told of any new findings that may affect your care.  Call 911  Call 911 if you have any of the following:   Severe weakness, dizziness, fainting, drowsiness, or confusion     Chest pain or shortness of breath   Heart beating fast, slow, or irregularly  When to seek medical advice  Call your healthcare provider right away  if any of these occur:   Nausea or vomiting   Feverof 100.19F (38C) or higher, or as directed by your healthcare provider   Unexpected weight gain or swelling in the legs, ankles, or around the eyes   Decrease or absent urine output  Date Last Reviewed: 01/14/2015   2000-2016 [employer] [address], [place], [place] [address]. All rights reserved. This information is not intended as a substitute for professional medical care. Always follow your healthcare professional's instructions.        Diabetes with High Blood Sugar  You have been treated for high blood sugar (hyperglycemia). This may be because of an infection or other illness, eating too many sweets or starches, or not taking enough insulin.  Home care  Monitor and write down your blood sugar level at least twice a day. Do this before breakfast and before dinner. If you take insulin, record your routine insulin dose as well. Also record any additional doses required based on your sliding scale. Do this for the next 3 to 5 days.  High blood sugar may cause symptoms that you can learn to recognize, such as:   Frequent urination   Thirst   Dizziness   Headache   Shortness of breath   Breath that smells fruity   Nausea or vomiting   Abdominal pain   Drowsiness or loss of consciousness  If you have high-blood-sugar symptoms, use a blood or urine test to find out what your blood sugar level is. If it is above your usual range, use the "sliding scale" regular insulin dose prescribed by your healthcare provider. If you were not given a range for your insulin dose, contact your healthcare provider for more advice.  Follow-up care  Follow up with your healthcare provider, or as advised. You may need to meet with your healthcare provider in the next week. You will likely review your blood sugar records together. You may also talk to your provider about adjusting your dose of insulin or other medicine for blood sugar.  When to seek medical  advice  Call your healthcare provider right away if these occur:   High blood sugar symptoms (Symptoms are described above.)   Blood sugar over 300 mg/dl If you can't reach your healthcare provider, go to a hospital emergency room or urgent care center  Date Last Reviewed: 10/14/2014   2000-2016 [employer] [address], [place], [place] [address]. All rights reserved. This information is not intended as a substitute for professional medical care. Always follow your healthcare professional's instructions.        Drug Abuse  Use and abuse of drugs like marijuana, amphetamines (speed, crank), cocaine, heroin or prescription pain medicines, sedatives and sleeping pills, MDMA, ecstasy, bath salts, PCP, mescaline and LSD may lead to addiction or dependence. Once this happens, you are at greater risk for any of the following:  Social and personal problems   Craving for the drug and not able to stop using even though you think you want to stop (psychological addiction)   Drug withdrawal symptoms if you stop taking the drug (physical dependence)   Loss of job or your family   Arrest, conviction, and jail sentence for possession of an illegal substance or for driving under the influence  Health problems   Strokes,  heart attacks, kidney failure   Accidental injuries to yourself or others while you are under the influence of the drug (in a car or at home)   HIV infection (much greater risk if you use IV drugs)   Skin infections   Other sexually transmitted disease (STDs such as herpes, chlamydia, gonorrhea, and others)   Severe and fatal infection of the heart valves (if you use IV drugs)   Hepatitis B or C   Death from overdose  Home care  The following suggestions can help you care for yourself at home:   Admit you have a drug problem. Ask for help from your family and close friends.   Seek professional help. This could be in the form of individual psychotherapy or counseling. There are also  outpatient, inpatient, and residential drug treatment programs.   Join a self-help group for drug abuse.   Stay away from friends who abuse drugs or tempt you to continue abusing drugs.   Eat a balanced diet and start a regular exercise program.  Follow-up care  Follow up with your healthcare provider, or as advised. Contact one of the resources below for help:   [employer] on Alcoholism and Drug Dependence www.ncadd.[place] 244-010-2725   Narcotics Anonymous www.na.[place] (251) 861-9374   National Alcohol and Substance Abuse Information Center (for referral to treatment programs) www.addictioncareoptions.com 978-385-2909  Call 911  Call 911 if any of the following occur:   Seizure   Hard time breathing or slow, irregular breathing   Chest pain   Sudden weakness on one side of your body or sudden trouble speaking   Very drowsy or trouble awakening   Fainting or loss of consciousness   Rapid heart rate   Very slow heart rate  When to seek medical advice  Call your healthcare provider right away if any of these occur:   Agitation, anxiety, unable to sleep   Unintended weight loss (more than 10 to 15 pounds over 3 months)   Fever of 100.21F (38C) or higher, or as directed by your healthcare provider   Shortness of breath   Cough with colored sputum   Redness, swelling, or tenderness at an injection site  Date Last Reviewed: 10/14/2014   2000-2016 [employer] [address] [place], [place] [address]. All rights reserved. This information is not intended as a substitute for professional medical care. Always follow your healthcare professional's instructions.

## 2017-07-11 NOTE — ED Notes (Signed)
Upon removing patients jeans and underwear to obtain a urine specimen found a broken pencil (broken in half) and capsules in a plastic bag.  Capsules are not prescription as they are in a clear capsule without markings containing a white powder.  Items turned over to [facility] with patient.

## 2017-07-11 NOTE — ED Provider Notes (Addendum)
Physician/Midlevel provider first contact with patient: 07/11/17 2113         History     Chief Complaint   Patient presents with   . Altered Mental Status     HPI : His is a 57 year old white male [facility] status. Other than that no historical features can be obtained from the patient. Apparently the patient had a change in his mental status earlier today after being arrested for "failure to appear."        Past Medical History:   Diagnosis Date   . Diabetes mellitus        Past Surgical History:   Procedure Laterality Date   . APPENDECTOMY  2011   . HERNIA REPAIR         History reviewed. No pertinent family history.    Social  Social History   Substance Use Topics   . Smoking status: Unknown If Ever Smoked   . Smokeless tobacco: Not on file   . Alcohol use Not on file       .     No Known Allergies    Home Medications     Med List Status:  Unable to Assess Set By: Leo Grosser, RN at 07/11/2017  9:13 PM        No Medications           Review of Systems: Review of systems as noted above and very limited due to the patient's clinical status and unable to adequately communicate.    Physical Exam    BP: 130/84, Heart Rate: (!) 132, Temp: 97.3 F (36.3 C), Resp Rate: (!) 24, SpO2: 99 %, Weight: 60 kg    Physical Exam:GEN; Well developed,well nourished. In no acute distress. Alert. Patient does note that he is in the hospital other than that he does not know the date.  HEENT: Head: Atraumatic, Normocephalic  Eyes: PERL, non-icteric  Ears: No discharge. Nose: No discharge.  Throat: Airway patent. Mucous membranes moist.  Neck: No JVD or trachael deviation. Supple.  Lungs: Clear, breath sounds equal.  CV: Regular, rate and rhythm. No murmur,gallop or rub.  Back: No spinal or CVA tenderness  Abdomen: Soft . No guarding, rebound, masses or organomegaly. BS active.  Lymph: No nodes palpable in the neck or groin.  Extremities: No edema or palpable cords.  Skin:  No jaundice or rash.  Neuro: Grossly intact and non-focal.  Psych: Unable to assess due to the patient's inability to cooperate.      MDM and ED Course   EKG: Sinus tachycardia: Rate: 132. Possible left ventricular hypertrophy with associated ST changes. No acute changes are noted. There are no other tracings to compare this with.  ED Medication Orders     None       An Unknown substance in capsules was found in his underwear. These appear to be homemade.  Patient was noted to be interacting appropriately with law enforcement. It appears that he communicates well when he wants to.      MDM                 Procedures    Clinical Impression & Disposition     Clinical Impression  Final diagnoses:   Multiple substance abuse   Hyperglycemia   Renal insufficiency   Acute substance intoxication   Medically noncompliant        ED Disposition     ED  Disposition Condition Date/Time Comment    Discharge  Wed Jul 11, 2017 10:56 PM Silvestre Gunner discharge to home/self care.    Condition at disposition: Stable           New Prescriptions    No medications on file                 Loyal Gambler., MD  07/11/17 2257       Loyal Gambler., MD  07/12/17 [phone]

## 2017-07-11 NOTE — ED Notes (Signed)
Patient again instructed to provide a urine specimen. Patient states he does not have to go.

## 2017-07-11 NOTE — ED Notes (Signed)
Patient remains alert and at times will answer appropiatly and at others answers are not at all appropriate.  Vitals remain normal except for his heart rate which is elevated.

## 2017-07-13 LAB — ECG 12-LEAD
P Wave Axis: 70 deg
P-R Interval: 116 ms
Patient Age: 57 years
Q-T Interval(Corrected): 479 ms
Q-T Interval: 323 ms
QRS Axis: 71 deg
QRS Duration: 89 ms
T Axis: 52 years
Ventricular Rate: 132 //min

## 2017-12-13 DIAGNOSIS — D62 Acute posthemorrhagic anemia: Secondary | ICD-10-CM

## 2017-12-13 DIAGNOSIS — N390 Urinary tract infection, site not specified: Secondary | ICD-10-CM

## 2017-12-13 DIAGNOSIS — I96 Gangrene, not elsewhere classified: Secondary | ICD-10-CM

## 2017-12-13 HISTORY — DX: Gangrene, not elsewhere classified: I96

## 2017-12-13 HISTORY — DX: Acute posthemorrhagic anemia: D62

## 2017-12-13 HISTORY — DX: Urinary tract infection, site not specified: N39.0

## 2018-01-01 DIAGNOSIS — I96 Gangrene, not elsewhere classified: Secondary | ICD-10-CM

## 2018-01-01 DIAGNOSIS — A419 Sepsis, unspecified organism: Secondary | ICD-10-CM

## 2018-01-01 DIAGNOSIS — L0231 Cutaneous abscess of buttock: Secondary | ICD-10-CM

## 2018-01-01 DIAGNOSIS — E876 Hypokalemia: Secondary | ICD-10-CM

## 2018-01-01 DIAGNOSIS — M79672 Pain in left foot: Secondary | ICD-10-CM

## 2018-01-01 DIAGNOSIS — E871 Hypo-osmolality and hyponatremia: Secondary | ICD-10-CM

## 2018-01-01 DIAGNOSIS — I70202 Unspecified atherosclerosis of native arteries of extremities, left leg: Secondary | ICD-10-CM

## 2018-01-01 DIAGNOSIS — N390 Urinary tract infection, site not specified: Secondary | ICD-10-CM

## 2018-01-01 DIAGNOSIS — D72829 Elevated white blood cell count, unspecified: Secondary | ICD-10-CM

## 2018-01-02 DIAGNOSIS — N390 Urinary tract infection, site not specified: Secondary | ICD-10-CM | POA: Diagnosis not present

## 2018-01-02 DIAGNOSIS — L0231 Cutaneous abscess of buttock: Secondary | ICD-10-CM | POA: Diagnosis not present

## 2018-01-02 DIAGNOSIS — E876 Hypokalemia: Secondary | ICD-10-CM | POA: Diagnosis not present

## 2018-01-02 DIAGNOSIS — A419 Sepsis, unspecified organism: Secondary | ICD-10-CM | POA: Diagnosis not present

## 2018-01-03 DIAGNOSIS — N390 Urinary tract infection, site not specified: Secondary | ICD-10-CM | POA: Diagnosis not present

## 2018-01-03 DIAGNOSIS — I70202 Unspecified atherosclerosis of native arteries of extremities, left leg: Secondary | ICD-10-CM

## 2018-01-03 DIAGNOSIS — L0231 Cutaneous abscess of buttock: Secondary | ICD-10-CM | POA: Diagnosis not present

## 2018-01-03 DIAGNOSIS — M79672 Pain in left foot: Secondary | ICD-10-CM | POA: Diagnosis not present

## 2018-01-03 DIAGNOSIS — I96 Gangrene, not elsewhere classified: Secondary | ICD-10-CM | POA: Diagnosis not present

## 2018-01-03 DIAGNOSIS — E876 Hypokalemia: Secondary | ICD-10-CM | POA: Diagnosis not present

## 2018-01-03 DIAGNOSIS — A419 Sepsis, unspecified organism: Secondary | ICD-10-CM | POA: Diagnosis not present

## 2018-01-04 DIAGNOSIS — A419 Sepsis, unspecified organism: Secondary | ICD-10-CM | POA: Diagnosis not present

## 2018-01-04 DIAGNOSIS — L0231 Cutaneous abscess of buttock: Secondary | ICD-10-CM | POA: Diagnosis not present

## 2018-01-04 DIAGNOSIS — E876 Hypokalemia: Secondary | ICD-10-CM | POA: Diagnosis not present

## 2018-01-04 DIAGNOSIS — N390 Urinary tract infection, site not specified: Secondary | ICD-10-CM | POA: Diagnosis not present

## 2018-01-05 ENCOUNTER — Inpatient Hospital Stay (HOSPITAL_COMMUNITY): Payer: Medicare Other

## 2018-01-05 ENCOUNTER — Inpatient Hospital Stay (HOSPITAL_COMMUNITY)
Admission: AD | Admit: 2018-01-05 | Discharge: 2018-01-17 | DRG: 239 | Disposition: A | Discharge status: Skilled Nursing Facility | Payer: Medicare Other | Source: Other Acute Inpatient Hospital | Attending: Internal Medicine | Admitting: Internal Medicine

## 2018-01-05 DIAGNOSIS — D72829 Elevated white blood cell count, unspecified: Secondary | ICD-10-CM

## 2018-01-05 DIAGNOSIS — D62 Acute posthemorrhagic anemia: Secondary | ICD-10-CM | POA: Diagnosis not present

## 2018-01-05 DIAGNOSIS — N390 Urinary tract infection, site not specified: Secondary | ICD-10-CM | POA: Diagnosis present

## 2018-01-05 DIAGNOSIS — L98492 Non-pressure chronic ulcer of skin of other sites with fat layer exposed: Secondary | ICD-10-CM | POA: Diagnosis present

## 2018-01-05 DIAGNOSIS — D638 Anemia in other chronic diseases classified elsewhere: Secondary | ICD-10-CM | POA: Diagnosis present

## 2018-01-05 DIAGNOSIS — R911 Solitary pulmonary nodule: Secondary | ICD-10-CM | POA: Diagnosis present

## 2018-01-05 DIAGNOSIS — E876 Hypokalemia: Secondary | ICD-10-CM | POA: Diagnosis present

## 2018-01-05 DIAGNOSIS — E162 Hypoglycemia, unspecified: Secondary | ICD-10-CM | POA: Diagnosis not present

## 2018-01-05 DIAGNOSIS — R7881 Bacteremia: Secondary | ICD-10-CM | POA: Diagnosis present

## 2018-01-05 DIAGNOSIS — E43 Unspecified severe protein-calorie malnutrition: Secondary | ICD-10-CM | POA: Diagnosis present

## 2018-01-05 DIAGNOSIS — I70262 Atherosclerosis of native arteries of extremities with gangrene, left leg: Secondary | ICD-10-CM

## 2018-01-05 DIAGNOSIS — F1120 Opioid dependence, uncomplicated: Secondary | ICD-10-CM | POA: Diagnosis not present

## 2018-01-05 DIAGNOSIS — L89323 Pressure ulcer of left buttock, stage 3: Secondary | ICD-10-CM | POA: Diagnosis present

## 2018-01-05 DIAGNOSIS — I7092 Chronic total occlusion of artery of the extremities: Secondary | ICD-10-CM | POA: Diagnosis present

## 2018-01-05 DIAGNOSIS — L89159 Pressure ulcer of sacral region, unspecified stage: Secondary | ICD-10-CM | POA: Diagnosis present

## 2018-01-05 DIAGNOSIS — L89123 Pressure ulcer of left upper back, stage 3: Secondary | ICD-10-CM | POA: Diagnosis present

## 2018-01-05 DIAGNOSIS — M6259 Muscle wasting and atrophy, not elsewhere classified, multiple sites: Secondary | ICD-10-CM | POA: Diagnosis present

## 2018-01-05 DIAGNOSIS — T3695XA Adverse effect of unspecified systemic antibiotic, initial encounter: Secondary | ICD-10-CM | POA: Diagnosis not present

## 2018-01-05 DIAGNOSIS — Z720 Tobacco use: Secondary | ICD-10-CM

## 2018-01-05 DIAGNOSIS — M5137 Other intervertebral disc degeneration, lumbosacral region: Secondary | ICD-10-CM | POA: Diagnosis not present

## 2018-01-05 DIAGNOSIS — I5032 Chronic diastolic (congestive) heart failure: Secondary | ICD-10-CM | POA: Diagnosis present

## 2018-01-05 DIAGNOSIS — G8220 Paraplegia, unspecified: Secondary | ICD-10-CM | POA: Diagnosis present

## 2018-01-05 DIAGNOSIS — L0231 Cutaneous abscess of buttock: Secondary | ICD-10-CM | POA: Diagnosis present

## 2018-01-05 DIAGNOSIS — G894 Chronic pain syndrome: Secondary | ICD-10-CM | POA: Diagnosis present

## 2018-01-05 DIAGNOSIS — J9382 Other air leak: Secondary | ICD-10-CM

## 2018-01-05 DIAGNOSIS — B964 Proteus (mirabilis) (morganii) as the cause of diseases classified elsewhere: Secondary | ICD-10-CM | POA: Diagnosis not present

## 2018-01-05 DIAGNOSIS — F17210 Nicotine dependence, cigarettes, uncomplicated: Secondary | ICD-10-CM | POA: Diagnosis present

## 2018-01-05 DIAGNOSIS — I96 Gangrene, not elsewhere classified: Secondary | ICD-10-CM | POA: Diagnosis present

## 2018-01-05 DIAGNOSIS — A419 Sepsis, unspecified organism: Secondary | ICD-10-CM | POA: Diagnosis not present

## 2018-01-05 DIAGNOSIS — Z681 Body mass index (BMI) 19 or less, adult: Secondary | ICD-10-CM | POA: Diagnosis not present

## 2018-01-05 DIAGNOSIS — L89610 Pressure ulcer of right heel, unstageable: Secondary | ICD-10-CM | POA: Diagnosis present

## 2018-01-05 DIAGNOSIS — M6250 Muscle wasting and atrophy, not elsewhere classified, unspecified site: Secondary | ICD-10-CM | POA: Diagnosis present

## 2018-01-05 DIAGNOSIS — A498 Other bacterial infections of unspecified site: Secondary | ICD-10-CM | POA: Diagnosis present

## 2018-01-05 DIAGNOSIS — Z8739 Personal history of other diseases of the musculoskeletal system and connective tissue: Secondary | ICD-10-CM

## 2018-01-05 DIAGNOSIS — B9562 Methicillin resistant Staphylococcus aureus infection as the cause of diseases classified elsewhere: Secondary | ICD-10-CM | POA: Diagnosis not present

## 2018-01-05 DIAGNOSIS — K521 Toxic gastroenteritis and colitis: Secondary | ICD-10-CM | POA: Diagnosis present

## 2018-01-05 DIAGNOSIS — I959 Hypotension, unspecified: Secondary | ICD-10-CM | POA: Diagnosis not present

## 2018-01-05 DIAGNOSIS — L89303 Pressure ulcer of unspecified buttock, stage 3: Secondary | ICD-10-CM | POA: Diagnosis present

## 2018-01-05 DIAGNOSIS — Z716 Tobacco abuse counseling: Secondary | ICD-10-CM

## 2018-01-05 HISTORY — DX: Acute posthemorrhagic anemia: D62

## 2018-01-05 HISTORY — DX: Other intervertebral disc degeneration, lumbar region: M51.36

## 2018-01-05 HISTORY — DX: Bacteremia: R78.81

## 2018-01-05 HISTORY — DX: Urinary tract infection, site not specified: N39.0

## 2018-01-05 HISTORY — DX: Other intervertebral disc degeneration, lumbar region without mention of lumbar back pain or lower extremity pain: M51.369

## 2018-01-05 HISTORY — DX: Heart failure, unspecified: I50.9

## 2018-01-05 HISTORY — DX: Pressure ulcer of sacral region, unspecified stage: L89.159

## 2018-01-05 HISTORY — DX: Chronic pain syndrome: G89.4

## 2018-01-05 HISTORY — DX: Unspecified atherosclerosis of native arteries of extremities, left leg: I70.202

## 2018-01-05 HISTORY — DX: Adverse effect of unspecified anesthetic, initial encounter: T41.45XA

## 2018-01-05 HISTORY — DX: Other complications of anesthesia, initial encounter: T88.59XA

## 2018-01-05 HISTORY — DX: Gangrene, not elsewhere classified: I96

## 2018-01-05 HISTORY — DX: Unspecified protein-calorie malnutrition: E46

## 2018-01-05 LAB — CREATININE, SERUM
CREATININE: 0.53 mg/dL — AB (ref 0.61–1.24)
GFR calc Af Amer: >60 mL/min (ref >60)

## 2018-01-05 LAB — CBC
HCT: 33.7 % — ABNORMAL LOW (ref 39.0–52.0)
Hemoglobin: 10.4 g/dL — ABNORMAL LOW (ref 13.0–17.0)
MCH: 28 pg (ref 26.0–34.0)
MCHC: 30.9 g/dL (ref 30.0–36.0)
MCV: 90.6 fL (ref 78.0–100.0)
Platelets: 277 10*3/uL (ref 150–400)
RBC: 3.72 MIL/uL — ABNORMAL LOW (ref 4.22–5.81)
RDW: 15.9 % — AB (ref 11.5–15.5)
WBC: 14.7 10*3/uL — ABNORMAL HIGH (ref 4.0–10.5)

## 2018-01-05 MED ORDER — HEPARIN SODIUM (PORCINE) 5000 UNIT/ML IJ SOLN
5000.0000 [IU] | Freq: Three times a day (TID) | INTRAMUSCULAR | Status: DC
  Administered 2018-01-05 (×2): 5000 [IU] via SUBCUTANEOUS
  Filled 2018-01-05 (×2): qty 1

## 2018-01-05 MED ORDER — POTASSIUM CHLORIDE 10 MEQ/50ML IV SOLN
10.0000 meq | Freq: Two times a day (BID) | INTRAVENOUS | Status: DC
  Administered 2018-01-05 – 2018-01-07 (×5): 10 meq via INTRAVENOUS
  Filled 2018-01-05 (×6): qty 50

## 2018-01-05 MED ORDER — VANCOMYCIN HCL IN DEXTROSE 1-5 GM/200ML-% IV SOLN
1000.0000 mg | Freq: Two times a day (BID) | INTRAVENOUS | Status: DC
  Administered 2018-01-05 – 2018-01-17 (×24): 1000 mg via INTRAVENOUS
  Filled 2018-01-05 (×25): qty 200

## 2018-01-05 MED ORDER — CYCLOBENZAPRINE HCL 10 MG PO TABS
10.0000 mg | ORAL_TABLET | Freq: Three times a day (TID) | ORAL | Status: DC | PRN
  Administered 2018-01-08 – 2018-01-11 (×5): 10 mg via ORAL
  Filled 2018-01-05 (×5): qty 1

## 2018-01-05 MED ORDER — OXYCODONE HCL 5 MG PO TABS
10.0000 mg | ORAL_TABLET | ORAL | Status: DC | PRN
  Administered 2018-01-06 – 2018-01-08 (×8): 10 mg via ORAL
  Administered 2018-01-08: 5 mg via ORAL
  Administered 2018-01-08 – 2018-01-09 (×4): 10 mg via ORAL
  Administered 2018-01-09: 5 mg via ORAL
  Administered 2018-01-09 – 2018-01-17 (×53): 10 mg via ORAL
  Filled 2018-01-05 (×67): qty 2

## 2018-01-05 MED ORDER — MIDODRINE HCL 5 MG PO TABS
5.0000 mg | ORAL_TABLET | Freq: Three times a day (TID) | ORAL | Status: DC
  Administered 2018-01-05 – 2018-01-17 (×33): 5 mg via ORAL
  Filled 2018-01-05 (×34): qty 1

## 2018-01-05 MED ORDER — MAGNESIUM SULFATE 2 GM/50ML IV SOLN
2.0000 g | Freq: Every day | INTRAVENOUS | Status: AC
  Administered 2018-01-05 – 2018-01-06 (×2): 2 g via INTRAVENOUS
  Filled 2018-01-05 (×2): qty 50

## 2018-01-05 MED ORDER — POTASSIUM CHLORIDE IN NACL 20-0.9 MEQ/L-% IV SOLN
INTRAVENOUS | Status: DC
  Administered 2018-01-05 – 2018-01-07 (×4): via INTRAVENOUS
  Filled 2018-01-05 (×4): qty 1000

## 2018-01-05 MED ORDER — SODIUM CHLORIDE 0.9 % IV SOLN
INTRAVENOUS | Status: DC | PRN
  Administered 2018-01-05: 250 mL via INTRAVENOUS

## 2018-01-05 MED ORDER — ASPIRIN EC 81 MG PO TBEC
81.0000 mg | DELAYED_RELEASE_TABLET | Freq: Every day | ORAL | Status: DC
  Administered 2018-01-05 – 2018-01-16 (×11): 81 mg via ORAL
  Filled 2018-01-05 (×12): qty 1

## 2018-01-05 MED ORDER — DIAZEPAM 5 MG PO TABS: 10.0000 mg | ORAL_TABLET | Freq: Three times a day (TID) | ORAL | Status: DC | PRN

## 2018-01-05 MED ORDER — FENTANYL 50 MCG/HR TD PT72
75.0000 ug | MEDICATED_PATCH | TRANSDERMAL | Status: DC
  Administered 2018-01-05 – 2018-01-14 (×4): 75 ug via TRANSDERMAL
  Filled 2018-01-05 (×4): qty 1

## 2018-01-05 MED ORDER — CHLORHEXIDINE GLUCONATE 4 % EX LIQD
60.0000 mL | Freq: Once | CUTANEOUS | Status: AC
  Administered 2018-01-06: 4 via TOPICAL
  Filled 2018-01-05: qty 60

## 2018-01-05 MED ORDER — POVIDONE-IODINE 10 % EX SWAB: 2.0000 "application " | Freq: Once | CUTANEOUS | Status: DC

## 2018-01-05 MED ORDER — ATORVASTATIN CALCIUM 40 MG PO TABS
40.0000 mg | ORAL_TABLET | Freq: Every day | ORAL | Status: DC
  Administered 2018-01-05 – 2018-01-16 (×12): 40 mg via ORAL
  Filled 2018-01-05 (×12): qty 1

## 2018-01-05 MED ORDER — CEPHALEXIN 500 MG PO CAPS
500.0000 mg | ORAL_CAPSULE | Freq: Three times a day (TID) | ORAL | Status: DC
  Administered 2018-01-05 – 2018-01-06 (×3): 500 mg via ORAL
  Filled 2018-01-05 (×3): qty 1

## 2018-01-05 MED ORDER — CEFAZOLIN SODIUM-DEXTROSE 2-4 GM/100ML-% IV SOLN
2.0000 g | INTRAVENOUS | Status: DC
  Filled 2018-01-05: qty 100

## 2018-01-05 MED ORDER — LOPERAMIDE HCL 2 MG PO CAPS: 4.0000 mg | ORAL_CAPSULE | ORAL | Status: DC | PRN

## 2018-01-05 NOTE — H&P (View-Only) (Signed)
  ORTHOPAEDIC CONSULTATION  REQUESTING PHYSICIAN: Sheehan, Theresa C, MD  Chief Complaint: gangrene left foot  HPI: Kevin Whitney is a 57 y.o. male who presents with gangrene of the left foot.  Arteriogram studies shows complete occlusion at the popliteal fossa on the left.  Patient states that he has no ability to weight-bear through his legs due to paralysis.  Patient states he is a total assist for transfers.  He states that his father has fallen several times trying to lift and carry him.  Past Medical History:  Diagnosis Date  . Broken legs   . Chronic pain   . MRSA (methicillin resistant staph aureus) culture positive    2003   . Staph infection    Past Surgical History:  Procedure Laterality Date  . BACK SURGERY    . FEMUR SURGERY     rt rod  . surgery every 3 wks for mrsa    . WRIST SURGERY     bilaterally   Social History   Socioeconomic History  . Marital status: Married    Spouse name: Not on file  . Number of children: Not on file  . Years of education: Not on file  . Highest education level: Not on file  Occupational History  . Not on file  Social Needs  . Financial resource strain: Not on file  . Food insecurity:    Worry: Not on file    Inability: Not on file  . Transportation needs:    Medical: Not on file    Non-medical: Not on file  Tobacco Use  . Smoking status: Current Every Day Smoker    Packs/day: 0.75    Types: Cigarettes  Substance and Sexual Activity  . Alcohol use: No    Alcohol/week: 0.0 standard drinks  . Drug use: No  . Sexual activity: Not on file  Lifestyle  . Physical activity:    Days per week: Not on file    Minutes per session: Not on file  . Stress: Not on file  Relationships  . Social connections:    Talks on phone: Not on file    Gets together: Not on file    Attends religious service: Not on file    Active member of club or organization: Not on file    Attends meetings of clubs or organizations: Not on file   Relationship status: Not on file  Other Topics Concern  . Not on file  Social History Narrative  . Not on file   Family History  Problem Relation Age of Onset  . Arthritis Father    - negative except otherwise stated in the family history section No Known Allergies Prior to Admission medications   Medication Sig Start Date End Date Taking? Authorizing Provider  cyclobenzaprine (FLEXERIL) 10 MG tablet Take 10 mg by mouth 3 (three) times daily as needed for muscle spasms.   Yes [provider]  diazepam (VALIUM) 10 MG tablet Take 10 mg by mouth every 12 (twelve) hours as needed for anxiety.  12/10/17  Yes [provider]  fentaNYL (DURAGESIC - DOSED MCG/HR) 75 MCG/HR Place 75 mcg onto the skin every other day.    Yes [provider]  liver oil-zinc oxide (DESITIN) 40 % ointment Apply 1 application topically as needed for irritation.   Yes [provider]  Multiple Vitamin (MULTIVITAMIN WITH MINERALS) TABS tablet Take 1 tablet by mouth daily.   Yes [provider]   No results found. - pertinent   xrays, CT, MRI studies were reviewed and independently interpreted  Positive ROS: All other systems have been reviewed and were otherwise negative with the exception of those mentioned in the HPI and as above.  Physical Exam: General: Alert, no acute distress Psychiatric: Patient is competent for consent with normal mood and affect Lymphatic: No axillary or cervical lymphadenopathy Cardiovascular: No pedal edema Respiratory: No cyanosis, no use of accessory musculature GI: No organomegaly, abdomen is soft and non-tender    Images:  @ENCIMAGES@  Labs:  Lab Results  Component Value Date   REPTSTATUS 02/14/2011 FINAL 02/07/2011   CULT NO GROWTH 5 DAYS 02/07/2011    Lab Results  Component Value Date   ALBUMIN 2.2 (L) 02/09/2011   ALBUMIN 3.0 (L) 02/07/2011    Neurologic: Patient does not have protective sensation bilateral lower  extremities.   MUSCULOSKELETAL:   Skin: Examination patient has dry gangrenous changes of the toes on the left foot.  There is no ascending cellulitis patient does not have a palpable pulse at the popliteal fossa or at the ankle.  Patient's legs are thin and atrophic he has permanent equinus contractures of both ankles.  Patient has limited motor strength in both lower extremities he is unable to stand on his own.  Assessment: Assessment: Gangrene left leg with complete occlusion of the popliteal vessel on the left with paralysis and total assist for transfers with severe protein caloric malnutrition.  Plan: Plan: Discussed with the patient and his family recommendation to proceed with an above-the-knee amputation on the left.  Patient does have good hair growth at the thigh and this should heal discussed risk of the wound not healing risk of infection.  Patient states he understands wished to proceed at this time will plan for surgery Sunday morning approximately 7:30 AM.  Thank you for the consult and the opportunity to see Kevin Whitney  Elfrida Pixley, MD [facility] [phone] 4:47 PM     

## 2018-01-05 NOTE — Consult Note (Signed)
ORTHOPAEDIC CONSULTATION  REQUESTING PHYSICIAN: Lahoma Crocker, MD  Chief Complaint: gangrene left foot  HPI: Kevin Whitney is a 57 y.o. male who presents with gangrene of the left foot.  Arteriogram studies shows complete occlusion at the popliteal fossa on the left.  Patient states that he has no ability to weight-bear through his legs due to paralysis.  Patient states he is a total assist for transfers.  He states that his father has fallen several times trying to lift and carry him.  Past Medical History:  Diagnosis Date  . Broken legs   . Chronic pain   . MRSA (methicillin resistant staph aureus) culture positive    2003   . Staph infection    Past Surgical History:  Procedure Laterality Date  . BACK SURGERY    . FEMUR SURGERY     rt rod  . surgery every 3 wks for mrsa    . WRIST SURGERY     bilaterally   Social History   Socioeconomic History  . Marital status: Married    Spouse name: Not on file  . Number of children: Not on file  . Years of education: Not on file  . Highest education level: Not on file  Occupational History  . Not on file  Social Needs  . Financial resource strain: Not on file  . Food insecurity:    Worry: Not on file    Inability: Not on file  . Transportation needs:    Medical: Not on file    Non-medical: Not on file  Tobacco Use  . Smoking status: Current Every Day Smoker    Packs/day: 0.75    Types: Cigarettes  Substance and Sexual Activity  . Alcohol use: No    Alcohol/week: 0.0 standard drinks  . Drug use: No  . Sexual activity: Not on file  Lifestyle  . Physical activity:    Days per week: Not on file    Minutes per session: Not on file  . Stress: Not on file  Relationships  . Social connections:    Talks on phone: Not on file    Gets together: Not on file    Attends religious service: Not on file    Active member of club or organization: Not on file    Attends meetings of clubs or organizations: Not on file   Relationship status: Not on file  Other Topics Concern  . Not on file  Social History Narrative  . Not on file   Family History  Problem Relation Age of Onset  . Arthritis Father    - negative except otherwise stated in the family history section No Known Allergies Prior to Admission medications   Medication Sig Start Date End Date Taking? Authorizing Provider  cyclobenzaprine (FLEXERIL) 10 MG tablet Take 10 mg by mouth 3 (three) times daily as needed for muscle spasms.   Yes [provider]  diazepam (VALIUM) 10 MG tablet Take 10 mg by mouth every 12 (twelve) hours as needed for anxiety.  12/10/17  Yes [provider]  fentaNYL (DURAGESIC - DOSED MCG/HR) 75 MCG/HR Place 75 mcg onto the skin every other day.    Yes [provider]  liver oil-zinc oxide (DESITIN) 40 % ointment Apply 1 application topically as needed for irritation.   Yes [provider]  Multiple Vitamin (MULTIVITAMIN WITH MINERALS) TABS tablet Take 1 tablet by mouth daily.   Yes [provider]   No results found. - pertinent  xrays, CT, MRI studies were reviewed and independently interpreted  Positive ROS: All other systems have been reviewed and were otherwise negative with the exception of those mentioned in the HPI and as above.  Physical Exam: General: Alert, no acute distress Psychiatric: Patient is competent for consent with normal mood and affect Lymphatic: No axillary or cervical lymphadenopathy Cardiovascular: No pedal edema Respiratory: No cyanosis, no use of accessory musculature GI: No organomegaly, abdomen is soft and non-tender    Images:  @ENCIMAGES @  Labs:  Lab Results  Component Value Date   REPTSTATUS 02/14/2011 FINAL 02/07/2011   CULT NO GROWTH 5 DAYS 02/07/2011    Lab Results  Component Value Date   ALBUMIN 2.2 (L) 02/09/2011   ALBUMIN 3.0 (L) 02/07/2011    Neurologic: Patient does not have protective sensation bilateral lower  extremities.   MUSCULOSKELETAL:   Skin: Examination patient has dry gangrenous changes of the toes on the left foot.  There is no ascending cellulitis patient does not have a palpable pulse at the popliteal fossa or at the ankle.  Patient's legs are thin and atrophic he has permanent equinus contractures of both ankles.  Patient has limited motor strength in both lower extremities he is unable to stand on his own.  Assessment: Assessment: Gangrene left leg with complete occlusion of the popliteal vessel on the left with paralysis and total assist for transfers with severe protein caloric malnutrition.  Plan: Plan: Discussed with the patient and his family recommendation to proceed with an above-the-knee amputation on the left.  Patient does have good hair growth at the thigh and this should heal discussed risk of the wound not healing risk of infection.  Patient states he understands wished to proceed at this time will plan for surgery Sunday morning approximately 7:30 AM.  Thank you for the consult and the opportunity to see Mr. Girtha RmBowman  Asif Muchow, MD [facility] [phone] 4:47 PM

## 2018-01-05 NOTE — H&P (Signed)
History and Physical    Kevin Whitney MBE:[id number] DOB: 07/08/1960 DOA: 01/05/2018  PCP: Maryland Pink, MD  Patient coming from: [facility] Whitney for from [facility]  I have personally briefly reviewed patient's old medical records in Kevin Whitney  Chief Complaint: Fever, dysuria, back ulcers, admitted on 12/31/2017 to [facility].  Transfer to our facility for evaluation by Dr. Sharol Given for AKA.  HPI: Kevin Whitney is a 57 y.o. 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  That ulcer burst open on the morning of admission.  His further work-up showed MRSA bacteremia and the source being his sacral ulcer.  He was found to have an abscess there.  He also had left foot toe ischemia and dry gangrene which was found on admission along with severe peripheral arterial disease in the left lower extremity.  Patient continues to enjoy smoking.  He has undergone TTE and TEE which are both unremarkable.  He is currently on IV vancomycin through the right IJ subclavian central line.  He is receiving IV vancomycin and has also had Proteus in his urine for which she is treated finishing treatment with Keflex.  His current issues are pain in his left lower extremity for which he requires a left-sided AKA.  This was born out by arteriogram done by CT scan.  The case has been discussed with Dr. due to who plans to operate on the patient.  When his multiple wounds I have also consulted Dr. Hulen Skains from general surgery.  I attempted to consult plastic surgery but was unable to find an on-call team.  On arrival patient complains of  severe pain.  States he is his fentanyl patches but states he has not been receiving them.  Review of his MAR at [facility] showed the fentanyl patches were active and I am confused by this.  I did also order some as needed oxycodone for breakthrough pain.   Review of Systems: As per HPI otherwise all other systems reviewed and  negative.    Past Medical History:  Diagnosis Date  . Broken legs   . Chronic pain   . MRSA (methicillin resistant staph aureus) culture positive    2003   . Staph infection     Past Surgical History:  Procedure Laterality Date  . BACK SURGERY    . FEMUR SURGERY     rt rod  . surgery every 3 wks for mrsa    . WRIST SURGERY     bilaterally    Social History   Social History Narrative  . Not on file     reports that he has been smoking cigarettes. He has been smoking about 0.75 packs per day. He does not have any smokeless tobacco history on file. He reports that he does not drink alcohol or use drugs.  No Known Allergies  Family History  Problem Relation Age of Onset  . Arthritis Father      Prior to Admission medications   Medication Sig Start Date End Date Taking? Authorizing Provider  cyclobenzaprine (FLEXERIL) 10 MG tablet Take 10 mg by mouth 3 (three) times daily as needed  for muscle spasms.    [provider]  diazepam (VALIUM) 5 MG tablet Take 10 mg by mouth 3 (three) times daily as needed for anxiety.    [provider]  fentaNYL (DURAGESIC - DOSED MCG/HR) 75 MCG/HR Place 75 mcg onto the skin daily.    [provider]    Physical Exam:  Constitutional: NAD, calm, dates he is uncomfortable due to pain Vitals:   01/05/18 1305  BP: (!) 117/56  Pulse: (!) 111  Resp: 18  Temp: 98.2 F (36.8 C)  TempSrc: Oral  SpO2: 100%  Weight: 73.6 kg   Eyes: PERRL, lids and conjunctivae normal ENMT: Mucous membranes are moist. Posterior pharynx clear of any exudate or lesions.Normal dentition.  Neck:  normal, supple, no masses, no thyromegaly Respiratory: clear to auscultation bilaterally, no wheezing, no crackles. Normal respiratory effort. No accessory muscle use.  Cardiovascular: Regular rate and rhythm, no murmurs / rubs / gallops. No extremity edema. 2+ pedal pulses. No carotid bruits.  Abdomen: no tenderness, no masses palpated. No hepatosplenomegaly. Bowel sounds positive.  Musculoskeletal: no clubbing / cyanosis. No joint deformity upper and lower extremities. Good ROM, no contractures. Normal muscle tone.  Skin: Dry gangrene of the toes of the left lower extremity specifically the great toe the middle toe and the third toe.  The left upper back which is deep but dry with no drainage and dressed, left lower back sacrum reveals a deep stage III ulcer with obvious pus draining from the wound.  Skin was packed this morning by surgery but is already draining through the gauze. Neurologic: CN 2-12 grossly intact. Sensation intact.  She has gotten out of bed only once or twice this year. Psychiatric: Normal judgment and insight. Alert and oriented x 3. Normal mood.    Labs on Admission: I have personally reviewed following labs and imaging studies White blood cell count 11.8, hemoglobin 10, hematocrit 30.1, platelet count 230, Sodium 137, potassium 3.4, chloride 112, CO2 26, BUN 4 creatinine 0.4 glucose 67 AST 10, ALT 16, total bili 0.4, alk phos 54, total protein 4.3 albumin 1.8, vancomycin trough 15.7   Radiological Exams on Admission: ABIs revealed a noncompressible posterior tibial artery and were unable to read ABIs on the left on the right he had no evidence of significant arterial disease.  CT angiography of abdominal aorta with iliofemoral runoff showed single vessel runoff along the posterior tibial arteries to both feet visualized in the level of the ankles.  Irregular atherosclerosis of the distal abdominal aorta, common iliac arteries, and branch vessels are as described.  Possible  high-grade stenosis or occlusion of the above-knee popliteal and popliteal arteries with reconstitution of the popliteal artery below the level of the knee joint on the left.  Nonvascular findings were 1 uncomplicated cholelithiasis next 2 left-sided renal calculi without obstruction 3 left buttock subcutaneous soft tissue abscess and ulceration without evidence of osteomyelitis measuring 10 x 2.4 x 9.5. 4.  Increased colonic stool burden and fecal impaction. 5.  Tiny 3 mm nodular opacities of the right middle lobe no follow-up as needed if patient is low risk and has no known or suspected primary neoplasm.  Noncontrast CT of the chest can be considered in 12 months if patient is high risk.  EKG is ordered and pending  Assessment/Plan Principal Problem:   Atherosclerosis of native artery of left lower extremity with gangrene [facility]) Active Problems:   Left buttock abscess   Stage III pressure ulcer of buttock ([place])  Hypokalemia   MRSA bacteremia   Antibiotic-associated diarrhea   Proteus infection   Acute lower UTI   Pressure ulcer of left upper back, stage 3 (HCC)   Tobacco abuse counseling   Non-healing ulcer of groin with fat layer exposed ([place])   H/O degenerative disc disease   Chronic narcotic dependence ([place])   Disuse atrophy of muscle   Right middle lobe pulmonary nodule   Chronic diastolic CHF (congestive heart failure) (HCC)   Gangrene of left foot ([place])   1.  Atherosclerosis of native artery of left lower extremity with gangrene.  Apparently this is in the popliteal artery and based on review all studies patient will require an above-the-knee amputation.  Dr. due to has been made aware of the patient's present patient is n.p.o. after midnight.  2.  MRSA bacteremia caused by left buttock subcutaneous soft tissue abscess and ulceration stage III.  IV fluids and IV pressors were initially required but that has now resolved.  Patient currently stable on IV vancomycin.  Right  subclavian line chest x-ray ordered for use of the line.  PICC line ordered as well.  He will need 14 days of treatment for MRSA bacteremia starting on 8/23.    3.  Hypokalemia: Patient has had persistent diarrhea and was C. difficile negative.  I suspect that his diarrhea is due to antibiotics.  Will increase potassium repletion and check every morning.  4.  Antibiotic associated diarrhea: Noted as above.  Replete potassium.  5.  Proteus infection UTI.  Ceftriaxone completed now on Keflex.  6.  Pressure ulcer of left upper back stage III: Wound care to see the patient.  No evidence of infection area appears clean and dry.  7.  Tobacco abuse counseling: Patient described to me the importance of smoking to him.  I described him the importance of not smoking given that he is about to undergo an above-the-knee amputation directly related to tobacco abuse.  Patient plans to try to quit smoking at discharge.  He does not require a nicotine patch at the present time per his request.  8.  Nonhealing ulcer of groin with fat layer exposed: Wound care consulted.  9.  History of degenerative disc disease of the spine: Noted.  10.  Chronic narcotic dependence: Patient restarted on fentanyl every 72 hours.  We will also order oxycodone 10 mg p.o. every 2 hours as needed breakthrough pain.  11.  Disuse atrophy of muscles: Patient does not get out of bed.  Hopefully we can try to improve his situation by assisting him to get out of bed twice daily.  12.  Right middle lobe pulmonary nodule: Per recommendations would wrote Laurey Arrow CT scan in 12 months.  Follow-up with his primary care physician.  13.  Chronic diastolic congestive heart failure: No ejection fraction is noted in the records we received from [place].  Patient not with any decompensation and therefore will not order echocardiogram.  14.  Gangrene of left foot patient for surgery as noted above.  DVT prophylaxis: Heparin Code Status: Full  code Family Communication: Talk with 4 family members who are in the room including the patient's parents.  All questions answered.  Patient retains capacity. Disposition Plan: Home or to skilled facility for wound care depending on patient condition. Consults called: Dr. Hulen Skains, general surgery; Dr. Sharol Given, orthopedic surgery; no plastic surgery call schedule noted.  Dr. Candiss Norse had recommended plastic surgery consultation. Admission status: Inpatient   Lady Deutscher MD FACP [facility]  Pager [phone]  If 7PM-7AM, please contact night-coverage [email or web address] Password [facility]  01/05/2018, 2:57 PM

## 2018-01-05 NOTE — Progress Notes (Signed)
ANTIBIOTIC CONSULT NOTE - INITIAL  Pharmacy Consult for Vancomycin Indication: MRSA wound and bacteremia  No Known Allergies  Patient Measurements: Weight: 162 lb 4.8 oz (73.6 kg) Adjusted Body Weight:    Vital Signs: Temp: 98.2 F (36.8 C) (08/24 1305) Temp Source: Oral (08/24 1305) BP: 117/56 (08/24 1305) Pulse Rate: 111 (08/24 1305) Intake/Output from previous day: No intake/output data recorded. Intake/Output from this shift: No intake/output data recorded.  Labs: No results for input(s): WBC, HGB, PLT, LABCREA, CREATININE in the last 72 hours. CrCl cannot be calculated (Patient's most recent lab result is older than the maximum 21 days allowed.). No results for input(s): VANCOTROUGH, VANCOPEAK, VANCORANDOM, GENTTROUGH, GENTPEAK, GENTRANDOM, TOBRATROUGH, TOBRAPEAK, TOBRARND, AMIKACINPEAK, AMIKACINTROU, AMIKACIN in the last 72 hours.   Microbiology: No results found for this or any previous visit (from the past 720 hour(s)).  Medical History: Past Medical History:  Diagnosis Date  . Broken legs   . Chronic pain   . MRSA (methicillin resistant staph aureus) culture positive    2003   . 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 Spoke with [facility] pharmacist  ID[email or web address]  [place] [facility]: MRSA bacteremia [facility] 8/20: SacraL wound: MRSA  Vanco (started at [place])>>  [facility] Vanco 1500mg /18>>Trough 11.6>>incr to 1g/12 hr 8/23 trough: 15.7: con't 1g IV q 12 hrs  Goal of Therapy:  Vancomycin trough level 15-20 mcg/ml  Plan:  Continue Vancomycin 1g IV q 12 hrs F/u labs   Renesmee Raine S. Merilynn Finlandobertson, PharmD, BCPS Clinical Staff Pharmacist Pager [phone]  Misty Stanleyobertson, Weslynn Ke Stillinger 01/05/2018,2:49 PM

## 2018-01-06 ENCOUNTER — Encounter (HOSPITAL_COMMUNITY)
Admission: AD | Disposition: A | Discharge status: Skilled Nursing Facility | Payer: Self-pay | Attending: Internal Medicine

## 2018-01-06 ENCOUNTER — Inpatient Hospital Stay (HOSPITAL_COMMUNITY): Payer: Medicare Other | Admitting: Certified Registered"

## 2018-01-06 ENCOUNTER — Encounter (HOSPITAL_COMMUNITY): Payer: Self-pay | Admitting: Certified Registered"

## 2018-01-06 DIAGNOSIS — N390 Urinary tract infection, site not specified: Secondary | ICD-10-CM

## 2018-01-06 DIAGNOSIS — E43 Unspecified severe protein-calorie malnutrition: Secondary | ICD-10-CM

## 2018-01-06 DIAGNOSIS — A498 Other bacterial infections of unspecified site: Secondary | ICD-10-CM

## 2018-01-06 DIAGNOSIS — Z716 Tobacco abuse counseling: Secondary | ICD-10-CM

## 2018-01-06 DIAGNOSIS — M6250 Muscle wasting and atrophy, not elsewhere classified, unspecified site: Secondary | ICD-10-CM

## 2018-01-06 DIAGNOSIS — L89123 Pressure ulcer of left upper back, stage 3: Secondary | ICD-10-CM

## 2018-01-06 DIAGNOSIS — I96 Gangrene, not elsewhere classified: Secondary | ICD-10-CM

## 2018-01-06 HISTORY — PX: AMPUTATION: SHX166

## 2018-01-06 LAB — HIV ANTIBODY (ROUTINE TESTING W REFLEX): HIV Screen 4th Generation wRfx: NONREACTIVE

## 2018-01-06 LAB — BASIC METABOLIC PANEL
ANION GAP: 4 — AB (ref 5–15)
BUN: <5 mg/dL — ABNORMAL LOW (ref 6–20)
CO2: 24 mmol/L (ref 22–32)
Calcium: 6.8 mg/dL — ABNORMAL LOW (ref 8.9–10.3)
Chloride: 113 mmol/L — ABNORMAL HIGH (ref 98–111)
Creatinine, Ser: 0.54 mg/dL — ABNORMAL LOW (ref 0.61–1.24)
GFR calc non Af Amer: >60 mL/min (ref >60)
GLUCOSE: 72 mg/dL (ref 70–99)
Potassium: 3.5 mmol/L (ref 3.5–5.1)
Sodium: 141 mmol/L (ref 135–145)

## 2018-01-06 LAB — PHOSPHORUS: Phosphorus: 2.3 mg/dL — ABNORMAL LOW (ref 2.5–4.6)

## 2018-01-06 LAB — CBC
HCT: 31.7 % — ABNORMAL LOW (ref 39.0–52.0)
HEMOGLOBIN: 9.9 g/dL — AB (ref 13.0–17.0)
MCH: 28.1 pg (ref 26.0–34.0)
MCHC: 31.2 g/dL (ref 30.0–36.0)
MCV: 90.1 fL (ref 78.0–100.0)
Platelets: 276 10*3/uL (ref 150–400)
RBC: 3.52 MIL/uL — AB (ref 4.22–5.81)
RDW: 15.9 % — ABNORMAL HIGH (ref 11.5–15.5)
WBC: 13.4 10*3/uL — ABNORMAL HIGH (ref 4.0–10.5)

## 2018-01-06 LAB — GLUCOSE, CAPILLARY
GLUCOSE-CAPILLARY: 154 mg/dL — AB (ref 70–99)
Glucose-Capillary: 56 mg/dL — ABNORMAL LOW (ref 70–99)

## 2018-01-06 SURGERY — AMPUTATION, ABOVE KNEE
Anesthesia: General | Laterality: Left

## 2018-01-06 MED ORDER — METOCLOPRAMIDE HCL 5 MG/ML IJ SOLN: 5.0000 mg | Freq: Three times a day (TID) | INTRAMUSCULAR | Status: DC | PRN

## 2018-01-06 MED ORDER — 0.9 % SODIUM CHLORIDE (POUR BTL) OPTIME
TOPICAL | Status: DC | PRN
  Administered 2018-01-06: 1000 mL

## 2018-01-06 MED ORDER — BISACODYL 10 MG RE SUPP: 10.0000 mg | Freq: Every day | RECTAL | Status: DC | PRN

## 2018-01-06 MED ORDER — FENTANYL CITRATE (PF) 250 MCG/5ML IJ SOLN
INTRAMUSCULAR | Status: AC
  Filled 2018-01-06: qty 5

## 2018-01-06 MED ORDER — SODIUM CHLORIDE 0.9% FLUSH
10.0000 mL | INTRAVENOUS | Status: DC | PRN
  Administered 2018-01-06: 10 mL
  Administered 2018-01-07 – 2018-01-09 (×3): 40 mL
  Administered 2018-01-14: 20 mL
  Administered 2018-01-15: 10 mL
  Filled 2018-01-06 (×6): qty 40

## 2018-01-06 MED ORDER — SODIUM CHLORIDE 0.9 % IV SOLN
INTRAVENOUS | Status: DC
  Administered 2018-01-06: 12:00:00 via INTRAVENOUS

## 2018-01-06 MED ORDER — DOCUSATE SODIUM 100 MG PO CAPS
100.0000 mg | ORAL_CAPSULE | Freq: Two times a day (BID) | ORAL | Status: DC
  Administered 2018-01-06 – 2018-01-16 (×17): 100 mg via ORAL
  Filled 2018-01-06 (×20): qty 1

## 2018-01-06 MED ORDER — HEPARIN SODIUM (PORCINE) 5000 UNIT/ML IJ SOLN
5000.0000 [IU] | Freq: Three times a day (TID) | INTRAMUSCULAR | Status: DC
  Administered 2018-01-08 – 2018-01-09 (×5): 5000 [IU] via SUBCUTANEOUS
  Filled 2018-01-06 (×5): qty 1

## 2018-01-06 MED ORDER — DEXTROSE 50 % IV SOLN
INTRAVENOUS | Status: AC
  Administered 2018-01-06: 50 mL
  Filled 2018-01-06: qty 50

## 2018-01-06 MED ORDER — METHOCARBAMOL 1000 MG/10ML IJ SOLN
500.0000 mg | Freq: Four times a day (QID) | INTRAVENOUS | Status: DC | PRN
  Filled 2018-01-06: qty 5

## 2018-01-06 MED ORDER — MEPERIDINE HCL 50 MG/ML IJ SOLN: 6.2500 mg | INTRAMUSCULAR | Status: DC | PRN

## 2018-01-06 MED ORDER — HYDROMORPHONE HCL 1 MG/ML IJ SOLN
INTRAMUSCULAR | Status: AC
  Filled 2018-01-06: qty 1

## 2018-01-06 MED ORDER — ONDANSETRON HCL 4 MG PO TABS: 4.0000 mg | ORAL_TABLET | Freq: Four times a day (QID) | ORAL | Status: DC | PRN

## 2018-01-06 MED ORDER — PROPOFOL 10 MG/ML IV BOLUS
INTRAVENOUS | Status: DC | PRN
  Administered 2018-01-06: 200 mg via INTRAVENOUS

## 2018-01-06 MED ORDER — ONDANSETRON HCL 4 MG/2ML IJ SOLN: 4.0000 mg | Freq: Once | INTRAMUSCULAR | Status: DC | PRN

## 2018-01-06 MED ORDER — LACTATED RINGERS IV SOLN
INTRAVENOUS | Status: DC | PRN
  Administered 2018-01-06: 08:00:00 via INTRAVENOUS

## 2018-01-06 MED ORDER — METHOCARBAMOL 500 MG PO TABS
ORAL_TABLET | ORAL | Status: AC
  Filled 2018-01-06: qty 1

## 2018-01-06 MED ORDER — LIDOCAINE 2% (20 MG/ML) 5 ML SYRINGE
INTRAMUSCULAR | Status: DC | PRN
  Administered 2018-01-06: 80 mg via INTRAVENOUS

## 2018-01-06 MED ORDER — MAGNESIUM CITRATE PO SOLN: 1.0000 | Freq: Once | ORAL | Status: DC | PRN

## 2018-01-06 MED ORDER — MIDAZOLAM HCL 2 MG/2ML IJ SOLN
INTRAMUSCULAR | Status: AC
  Filled 2018-01-06: qty 2

## 2018-01-06 MED ORDER — FENTANYL CITRATE (PF) 250 MCG/5ML IJ SOLN
INTRAMUSCULAR | Status: DC | PRN
  Administered 2018-01-06: 50 ug via INTRAVENOUS
  Administered 2018-01-06 (×2): 25 ug via INTRAVENOUS
  Administered 2018-01-06: 50 ug via INTRAVENOUS

## 2018-01-06 MED ORDER — PROPOFOL 10 MG/ML IV BOLUS
INTRAVENOUS | Status: AC
  Filled 2018-01-06: qty 20

## 2018-01-06 MED ORDER — MIDAZOLAM HCL 5 MG/5ML IJ SOLN
INTRAMUSCULAR | Status: DC | PRN
  Administered 2018-01-06: 2 mg via INTRAVENOUS

## 2018-01-06 MED ORDER — HYDROMORPHONE HCL 1 MG/ML IJ SOLN
0.2500 mg | INTRAMUSCULAR | Status: DC | PRN
  Administered 2018-01-06: 0.5 mg via INTRAVENOUS

## 2018-01-06 MED ORDER — ONDANSETRON HCL 4 MG/2ML IJ SOLN
INTRAMUSCULAR | Status: DC | PRN
  Administered 2018-01-06: 4 mg via INTRAVENOUS

## 2018-01-06 MED ORDER — METHOCARBAMOL 500 MG PO TABS
500.0000 mg | ORAL_TABLET | Freq: Four times a day (QID) | ORAL | Status: DC | PRN
  Administered 2018-01-06 – 2018-01-16 (×9): 500 mg via ORAL
  Filled 2018-01-06 (×8): qty 1

## 2018-01-06 MED ORDER — METOCLOPRAMIDE HCL 5 MG PO TABS: 5.0000 mg | ORAL_TABLET | Freq: Three times a day (TID) | ORAL | Status: DC | PRN

## 2018-01-06 MED ORDER — DEXTROSE 50 % IV SOLN
50.0000 mL | Freq: Once | INTRAVENOUS | Status: AC
  Filled 2018-01-06: qty 50

## 2018-01-06 MED ORDER — ONDANSETRON HCL 4 MG/2ML IJ SOLN: 4.0000 mg | Freq: Four times a day (QID) | INTRAMUSCULAR | Status: DC | PRN

## 2018-01-06 MED ORDER — CEFAZOLIN SODIUM-DEXTROSE 2-3 GM-%(50ML) IV SOLR
INTRAVENOUS | Status: DC | PRN
  Administered 2018-01-06: 2 g via INTRAVENOUS

## 2018-01-06 MED ORDER — CEFAZOLIN SODIUM-DEXTROSE 1-4 GM/50ML-% IV SOLN
1.0000 g | Freq: Four times a day (QID) | INTRAVENOUS | Status: AC
  Administered 2018-01-06 – 2018-01-07 (×3): 1 g via INTRAVENOUS
  Filled 2018-01-06 (×3): qty 50

## 2018-01-06 MED ORDER — POLYETHYLENE GLYCOL 3350 17 G PO PACK: 17.0000 g | PACK | Freq: Every day | ORAL | Status: DC | PRN

## 2018-01-06 MED ORDER — PHENYLEPHRINE 40 MCG/ML (10ML) SYRINGE FOR IV PUSH (FOR BLOOD PRESSURE SUPPORT)
PREFILLED_SYRINGE | INTRAVENOUS | Status: DC | PRN
  Administered 2018-01-06: 80 ug via INTRAVENOUS

## 2018-01-06 SURGICAL SUPPLY — 30 items
BLADE SAW RECIP 87.9 MT (BLADE) ×3 IMPLANT
COVER SURGICAL LIGHT HANDLE (MISCELLANEOUS) ×3 IMPLANT
DRAPE INCISE IOBAN 66X45 STRL (DRAPES) ×6 IMPLANT
DRAPE U-SHAPE 47X51 STRL (DRAPES) ×3 IMPLANT
DRESSING PREVENA PLUS CUSTOM (GAUZE/BANDAGES/DRESSINGS) ×1 IMPLANT
DRSG PREVENA PLUS CUSTOM (GAUZE/BANDAGES/DRESSINGS) ×3
DURAPREP 26ML APPLICATOR (WOUND CARE) ×3 IMPLANT
ELECT REM PT RETURN 9FT ADLT (ELECTROSURGICAL) ×3
ELECTRODE REM PT RTRN 9FT ADLT (ELECTROSURGICAL) ×1 IMPLANT
GLOVE BIOGEL PI IND STRL 9 (GLOVE) ×1 IMPLANT
GLOVE BIOGEL PI INDICATOR 9 (GLOVE) ×2
GLOVE SURG ORTHO 9.0 STRL STRW (GLOVE) ×3 IMPLANT
GOWN STRL REUS W/ TWL XL LVL3 (GOWN DISPOSABLE) ×2 IMPLANT
GOWN STRL REUS W/TWL XL LVL3 (GOWN DISPOSABLE) ×4
KIT BASIN OR (CUSTOM PROCEDURE TRAY) ×3 IMPLANT
KIT TURNOVER KIT B (KITS) ×3 IMPLANT
MANIFOLD NEPTUNE II (INSTRUMENTS) ×3 IMPLANT
NS IRRIG 1000ML POUR BTL (IV SOLUTION) ×3 IMPLANT
PACK ORTHO EXTREMITY (CUSTOM PROCEDURE TRAY) ×3 IMPLANT
PAD ARMBOARD 7.5X6 YLW CONV (MISCELLANEOUS) ×3 IMPLANT
PREVENA RESTOR ARTHOFORM 46X30 (CANNISTER) ×2 IMPLANT
STAPLER VISISTAT 35W (STAPLE) ×2 IMPLANT
STOCKINETTE IMPERVIOUS LG (DRAPES) ×2 IMPLANT
SUT ETHILON 2 0 PSLX (SUTURE) ×6 IMPLANT
SUT SILK 2 0 (SUTURE) ×2
SUT SILK 2-0 18XBRD TIE 12 (SUTURE) ×1 IMPLANT
TOWEL GREEN STERILE FF (TOWEL DISPOSABLE) ×3 IMPLANT
TUBE CONNECTING 20'X1/4 (TUBING) ×1
TUBE CONNECTING 20X1/4 (TUBING) ×2 IMPLANT
YANKAUER SUCT BULB TIP NO VENT (SUCTIONS) ×3 IMPLANT

## 2018-01-06 NOTE — Consult Note (Signed)
WOC Nurse wound consult note Reason for Consult: wound on right thigh, sacrum and buttocks. Also Unstageable PrI on right heel. Surgical wound at Left AKA under the care of Dr. Lajoyce Cornersuda (orthopedics) and with NPWT.  CCS has been consulted for the sacral wound and Dr. Carolynne Edouardoth has seen today. Please see his/their notes.  WOC Nurse will provide supportive care to wounds not mentioned as both surgical service orders supercede that of the WOC Nurse. Wound type: Full thickness, infectious, pressure Pressure Injury POA: Yes Measurement: Right heel:  Unstageable (with eschar, stable) to posterior right heel measures 2.5cm round.  Unable to assess depth.  No drainage. Right thigh:  4cm x 11cm x 0.2cm with 60% red, 40% thin yellow fibrinous material obscuring wound bed. Moderate amount of thin yellow exudate. Sacrum: 2cm x 4.2cm x 0.2cm with 4cm undermining at 9 o'clock. Necrotic slough protruding from wound bed. Red, wet with musty odor. Satellite wounds on back, superior to sacral wound: Several pinpoint (<0.2cm) with yellow wound bed surround the larger sacral wound. Wound bed:As described above Drainage (amount, consistency, odor) Moderate drainage, musty odor (as noted above) Periwound: mild erythema surrounds the sacral wound. Dressing procedure/placement/frequency: A mattress replacement with low air loss feature is provided as is a pressure redistribution heel boot for the right heel (with daily conservative care orders provided). The right thigh wound is provided topical care guidance as the Nurses had no orders for that.  Additionally, I have implemented conservative orders for the sacral and surrounding wounds until the surgical path mentioned by Dr. Carolynne Edouardoth is determined.  WOC nursing team will not follow, but will remain available to this patient, the nursing and medical teams.  Please re-consult if needed. Thanks, Ladona MowLaurie Kevyn Boquet, MSN, RN, GNP, Hans EdenCWOCN, CWON-AP, FAAN  Pager# [phone]

## 2018-01-06 NOTE — Anesthesia Postprocedure Evaluation (Signed)
Anesthesia Post Note  Patient: Kevin SimsDavid Whitney  Procedure(s) Performed: AMPUTATION ABOVE KNEE (Left )     Patient location during evaluation: PACU Anesthesia Type: General Level of consciousness: awake and alert Pain management: pain level controlled Vital Signs Assessment: post-procedure vital signs reviewed and stable Respiratory status: spontaneous breathing, nonlabored ventilation, respiratory function stable and patient connected to nasal cannula oxygen Cardiovascular status: blood pressure returned to baseline and stable Postop Assessment: no apparent nausea or vomiting Anesthetic complications: no    Last Vitals:  Vitals:   01/06/18 0848 01/06/18 0900  BP: 104/73   Pulse: (!) 102 96  Resp: 13 15  Temp: (!) 36.1 C   SpO2: 98% 98%    Last Pain:  Vitals:   01/06/18 0848  TempSrc:   PainSc: 0-No pain                 Michaela Shankel Emonte

## 2018-01-06 NOTE — Op Note (Signed)
01/06/2018  8:58 AM  PATIENT:  Kevin Simsavid [place]    PRE-OPERATIVE DIAGNOSIS:  left foot gangrene  POST-OPERATIVE DIAGNOSIS:  Same  PROCEDURE:  AMPUTATION ABOVE KNEE, application of restore plus Praveena plus wound VAC dressing  SURGEON:  Nadara MustardMarcus V Epifanio Labrador, MD  PHYSICIAN ASSISTANT:None ANESTHESIA:   General  PREOPERATIVE INDICATIONS:  Kevin SimsDavid Whitney is a  57 y.o. male with a diagnosis of left foot gangrene who failed conservative measures and elected for surgical management.    The risks benefits and alternatives were discussed with the patient preoperatively including but not limited to the risks of infection, bleeding, nerve injury, cardiopulmonary complications, the need for revision surgery, among others, and the patient was willing to proceed.  OPERATIVE IMPLANTS: Restore plus Praveena plus wound VAC dressing  @ENCIMAGES @  OPERATIVE FINDINGS: Minimal petechial bleeding completely occluded femoral vessels  OPERATIVE PROCEDURE: Patient was brought the operating room and underwent a general anesthetic.  After adequate levels of anesthesia were obtained patient's left lower extremity was prepped using DuraPrep draped in the sterile field the gangrenous foot and leg were draped out of the sterile field with impervious stockinette.  Patient received 2 g of Kefzol preoperatively a timeout was called.  A fishmouth incision was made through the distal thigh.  This was carried sharply down to bone.  The femoral vessels were clamped and ligated with 2-0 silk.  A reciprocating saw was used to complete the amputation.  Electrocautery was used for further hemostasis.  The wound was irrigated with normal saline.  The deep and superficial fascia layers and skin was closed using 2-0 nylon.  A Praveena customizable dressing was placed dorsal to posterior midline of the incision the restore dressing was then placed medial to lateral and the entire limb was covered with the compression foam.  This was covered with  Ioban.  There was a good suction fit patient was extubated taken to PACU in stable condition.   DISCHARGE PLANNING:  Antibiotic duration: Continue antibiotics for 24 hours.  Weightbearing: Nonweightbearing on the left.  Pain medication: Patient is currently on maintenance opioids  Dressing care/ Wound VAC: Continue wound VAC for a total of 2 weeks discharge with the Praveena portable pump  Ambulatory devices: Walker  Discharge to: Skilled nursing or home.  Follow-up: In the office 1 week post operative.

## 2018-01-06 NOTE — Consult Note (Signed)
Reason for Consult:bacteremia Referring Physician: Dr. Evlyn Kanner Sowash is an 58 y.o. male.  HPI: The patient is a 57 year old white male who presents with burning with urination. He was found to have MRSA bacteremia and was transferred to [facility]. He had dry gangrene of his left foot and was taken to OR today for AKA. He was also noted to have a sacral decub that is infected and may be the source of the bacteremia.  Past Medical History:  Diagnosis Date  . Broken legs   . Chronic pain   . MRSA (methicillin resistant staph aureus) culture positive    2003   . Staph infection     Past Surgical History:  Procedure Laterality Date  . BACK SURGERY    . FEMUR SURGERY     rt rod  . surgery every 3 wks for mrsa    . WRIST SURGERY     bilaterally    Family History  Problem Relation Age of Onset  . Arthritis Father     Social History:  reports that he has been smoking cigarettes. He has been smoking about 0.75 packs per day. He does not have any smokeless tobacco history on file. He reports that he does not drink alcohol or use drugs.  Allergies: No Known Allergies  Medications: I have reviewed the patient's current medications.  Results for orders placed or performed during the hospital encounter of 01/05/18 (from the past 48 hour(s))  HIV antibody (Routine Testing)     Status: None   Collection Time: 01/05/18  3:06 PM  Result Value Ref Range   HIV Screen 4th Generation wRfx Non Reactive Non Reactive    Comment: (NOTE) Performed At: [facility] [place], [place] [id number] Rush Farmer MD RJ:[id number]   CBC     Status: Abnormal   Collection Time: 01/05/18  3:06 PM  Result Value Ref Range   WBC 14.7 (H) 4.0 - 10.5 K/uL   RBC 3.72 (L) 4.22 - 5.81 MIL/uL   Hemoglobin 10.4 (L) 13.0 - 17.0 g/dL   HCT 33.7 (L) 39.0 - 52.0 %   MCV 90.6 78.0 - 100.0 fL   MCH 28.0 26.0 - 34.0 pg   MCHC 30.9 30.0 - 36.0 g/dL   RDW 15.9 (H) 11.5 - 15.5 %   Platelets  277 150 - 400 K/uL    Comment: Performed at [facility], [place] [address] [place], [place] [address]  Creatinine, serum     Status: Abnormal   Collection Time: 01/05/18  3:06 PM  Result Value Ref Range   Creatinine, Ser 0.53 (L) 0.61 - 1.24 mg/dL   GFR calc non Af Amer >60 >60 mL/min   GFR calc Af Amer >60 >60 mL/min    Comment: (NOTE) The eGFR has been calculated using the CKD EPI equation. This calculation has not been validated in all clinical situations. eGFR's persistently <60 mL/min signify possible Chronic Kidney Disease. Performed at [facility], [place] [address] [place], [place] [address]   Basic metabolic panel     Status: Abnormal   Collection Time: 01/06/18  3:37 AM  Result Value Ref Range   Sodium 141 135 - 145 mmol/L   Potassium 3.5 3.5 - 5.1 mmol/L   Chloride 113 (H) 98 - 111 mmol/L   CO2 24 22 - 32 mmol/L   Glucose, Bld 72 70 - 99 mg/dL   BUN <5 (L) 6 - 20 mg/dL   Creatinine,  Ser 0.54 (L) 0.61 - 1.24 mg/dL   Calcium 6.8 (L) 8.9 - 10.3 mg/dL   GFR calc non Af Amer >60 >60 mL/min   GFR calc Af Amer >60 >60 mL/min    Comment: (NOTE) The eGFR has been calculated using the CKD EPI equation. This calculation has not been validated in all clinical situations. eGFR's persistently <60 mL/min signify possible Chronic Kidney Disease.    Anion gap 4 (L) 5 - 15    Comment: Performed at [facility] [address] [place], [place] [address]  CBC     Status: Abnormal   Collection Time: 01/06/18  3:37 AM  Result Value Ref Range   WBC 13.4 (H) 4.0 - 10.5 K/uL   RBC 3.52 (L) 4.22 - 5.81 MIL/uL   Hemoglobin 9.9 (L) 13.0 - 17.0 g/dL   HCT 31.7 (L) 39.0 - 52.0 %   MCV 90.1 78.0 - 100.0 fL   MCH 28.1 26.0 - 34.0 pg   MCHC 31.2 30.0 - 36.0 g/dL   RDW 15.9 (H) 11.5 - 15.5 %   Platelets 276 150 - 400 K/uL    Comment: Performed at [facility], [place] [address] [place], [place] [address]  Phosphorus     Status: Abnormal   Collection Time: 01/06/18   3:37 AM  Result Value Ref Range   Phosphorus 2.3 (L) 2.5 - 4.6 mg/dL    Comment: Performed at [facility] [address] [place], [place] [address]  Glucose, capillary     Status: Abnormal   Collection Time: 01/06/18  6:17 AM  Result Value Ref Range   Glucose-Capillary 56 (L) 70 - 99 mg/dL  Glucose, capillary     Status: Abnormal   Collection Time: 01/06/18  6:37 AM  Result Value Ref Range   Glucose-Capillary 154 (H) 70 - 99 mg/dL    Dg Chest Port 1 View  Result Date: 01/05/2018 CLINICAL DATA:  Line placement, preoperative for left above-the-knee amputation EXAM: PORTABLE CHEST 1 VIEW COMPARISON:  01/01/2018 chest radiograph. FINDINGS: Right subclavian central venous catheter terminates in the middle third of the SVC. Spinal fusion hardware overlies the left lower thoracic spine. Stable cardiomediastinal silhouette with normal heart size. No pneumothorax. No pleural effusion. Nodular 1 cm opacity overlying peripheral mid to lower right lung, stable since 01/01/2018 radiograph, new since 03/09/2016 chest CT. No pulmonary edema. Stable mild scarring versus atelectasis at the left greater than right lung bases. IMPRESSION: 1. Nodular 1 cm opacity overlying the peripheral mid to lower right lung, new since 2017 chest CT. Cannot exclude pulmonary nodule. Recommend further evaluation chest CT. 2. Stable mild scarring at the left lung base. 3. Well-positioned right subclavian central venous catheter. Electronically Signed   By: Ilona Sorrel M.D.   On: 01/05/2018 16:52    Review of Systems  Constitutional: Positive for fever.  HENT: Negative.   Eyes: Negative.   Respiratory: Negative.   Cardiovascular: Negative.   Gastrointestinal: Negative.   Genitourinary: Negative.   Musculoskeletal: Negative.   Skin: Negative.   Neurological: Negative.   Endo/Heme/Allergies: Negative.   Psychiatric/Behavioral: Negative.    Blood pressure 113/64, pulse 81, temperature 97.7 F (36.5 C),  temperature source Oral, resp. rate 18, weight 60.9 kg, SpO2 95 %. Physical Exam  Constitutional: He is oriented to person, place, and time. He appears well-developed and well-nourished. No distress.  HENT:  Head: Normocephalic and atraumatic.  Mouth/Throat: No oropharyngeal exudate.  Eyes: Pupils are equal, round, and  reactive to light. Conjunctivae and EOM are normal.  Neck: Normal range of motion. Neck supple.  Cardiovascular: Normal rate, regular rhythm and normal heart sounds.  Respiratory: Effort normal and breath sounds normal. No stridor. No respiratory distress.  GI: Soft. Bowel sounds are normal. There is no tenderness.  Musculoskeletal:  AKA done in OR this am  Neurological: He is alert and oriented to person, place, and time. Coordination normal.  Skin: Skin is warm and dry. No rash noted.  Psychiatric: He has a normal mood and affect. His behavior is normal. Thought content normal.    Assessment/Plan: The patient appears to have an open wound in the sacral area with multiple sites draining pus. Although it is well drained I think he will probably benefit from having this debrided in the OR in the upcoming days. Will talk with the surgicalist team in am. Would continue IV abx for now  Lennox S 01/06/2018, 10:44 AM

## 2018-01-06 NOTE — Interval H&P Note (Signed)
History and Physical Interval Note:  01/06/2018 7:29 AM  Kevin Whitney  has presented today for surgery, with the diagnosis of left foot gangrene  The various methods of treatment have been discussed with the patient and family. After consideration of risks, benefits and other options for treatment, the patient has consented to  Procedure(s): AMPUTATION ABOVE KNEE (Left) as a surgical intervention .  The patient's history has been reviewed, patient examined, no change in status, stable for surgery.  I have reviewed the patient's chart and labs.  Questions were answered to the patient's satisfaction.     Nadara MustardMarcus V Duda

## 2018-01-06 NOTE — Plan of Care (Signed)
  Problem: Nutrition: Goal: Adequate nutrition will be maintained Outcome: Progressing   Problem: Skin Integrity: Goal: Risk for impaired skin integrity will decrease Outcome: Progressing   

## 2018-01-06 NOTE — Progress Notes (Signed)
PROGRESS NOTE  Kevin Whitney ZOX:[id number] DOB: 1961-03-24 DOA: 01/05/2018 PCP: [name], MD  HPI/Recap of past 24 hours: Kevin Whitney is a 57 y.o. male with medical history significant for PVD, tobacco abuse, degenerative disc disease of the spine status post operation, disuse atrophy of his muscles, history of MRSA of the spine and chronic narcotic dependence (on fentanyl patches) presented to the ED at [facility] via EMS due to dysuria and pressure ulcer of the left upper back which is chronic but developing a left lower back and sacral ulcer. Further work-up showed MRSA bacteremia, likely source his sacral ulcer.  Pt was also found to have LLE gangrene, CT arteriogram showed severe PVD, and was subsequently transferred to [facility] for AKA by Dr Lajoyce Corners. General surgery was also consulted for his multiple ulcers that may need debridement. Pt admitted for further management.   Today, saw pt after AKA surgery, denies any significant pain, chest pain, SOB, abdominal pain, N/V, fever/chills.  Assessment/Plan: Principal Problem:   Atherosclerosis of native artery of left lower extremity with gangrene [facility]) Active Problems:   Left buttock abscess   Stage III pressure ulcer of buttock (HCC)   MRSA bacteremia   Antibiotic-associated diarrhea   Proteus infection   Acute lower UTI   Pressure ulcer of left upper back, stage 3 (HCC)   Hypokalemia   H/O degenerative disc disease   Chronic narcotic dependence (HCC)   Disuse atrophy of muscle   Tobacco abuse counseling   Right middle lobe pulmonary nodule   Chronic diastolic CHF (congestive heart failure) (HCC)   Non-healing ulcer of groin with fat layer exposed (HCC)   Gangrene of left foot (HCC)   Severe protein-calorie malnutrition (HCC)  Left foot gangrene 2/2 severe PVD S/P AKA with wound vac by Dr Lajoyce Corners on 01/06/18 Arteriogram studies shows complete occlusion of the popliteal fossa on the left Orthopedics on board, appreciate  recs Continue IVF, IV cefazolin Pain management  Continue ASA, lipitor PT/OT  MRSA bacteremia Likely from decubitus ulcers Required IVF and pressors on admission at [facility] Currently afebrile with leukocytosis BC done in [place] showed MRSA, will repeat TTE and TEE done at [facility] are both unremarkable Continue IV vancomycin, started on 8/23, duration?? 2 weeks May consult ID in am  Multiple decubitus/pressure ulcer Left upper back, sacral, groin, left buttock Gen surg consulted for possible debridement Wound care consulted, apprec recs Continue IV AB  Anemia of chronic disease/malnutrition May worsen due to recent surgery Daily CBC  Proteus UTI Completed treatment with Keflex  Chronic diastolic HF Stable Monitor  Disuse atrophy of muscles PT/OT  Malnutrition Dietician on board  Chronic pain syndrome/narcotic dependence Continue pain management with home fentanyl patch   Tobacco abuse Advised to quit Nicotine patch    Code Status: Full   Family Communication: None at bedside  Disposition Plan: Likely SNF, TBD by PT/OT   Consultants:  Orthopedics  Gen surgery  IR for access placement   Procedures:  AKA on 01/06/18  TEE  Antimicrobials:  IV Vancomycin  IV Cefazolin  DVT prophylaxis: Heparin [place]   Objective: Vitals:   01/06/18 0909 01/06/18 0915 01/06/18 0926 01/06/18 0942  BP:  112/67 115/65 113/64  Pulse: 86 93 84 81  Resp: 17 16 19 18   Temp:    97.7 F (36.5 C)  TempSrc:    Oral  SpO2: 99% 100% 98% 95%  Weight:        Intake/Output Summary (Last 24 hours) at 01/06/2018 1152 Last  data filed at 01/06/2018 0852 Gross per 24 hour  Intake 1536.6 ml  Output 2350 ml  Net -813.4 ml   Filed Weights   01/05/18 1305 01/06/18 0418  Weight: 73.6 kg 60.9 kg    Exam:   General: NAD, cachetic   Cardiovascular: S1, S2 present   Respiratory: Diminished BS bilaterally  Abdomen: Soft   Musculoskeletal: Left AKA, No pedal  edema on R  Skin: Normal  Psychiatry: Normal mood   Data Reviewed: CBC: Recent Labs  Lab 01/05/18 1506 01/06/18 0337  WBC 14.7* 13.4*  HGB 10.4* 9.9*  HCT 33.7* 31.7*  MCV 90.6 90.1  PLT 277 276   Basic Metabolic Panel: Recent Labs  Lab 01/05/18 1506 01/06/18 0337  NA  --  141  K  --  3.5  CL  --  113*  CO2  --  24  GLUCOSE  --  72  BUN  --  <5*  CREATININE 0.53* 0.54*  CALCIUM  --  6.8*  PHOS  --  2.3*   GFR: CrCl cannot be calculated (Unknown ideal weight.). Liver Function Tests: No results for input(s): AST, ALT, ALKPHOS, BILITOT, PROT, ALBUMIN in the last 168 hours. No results for input(s): LIPASE, AMYLASE in the last 168 hours. No results for input(s): AMMONIA in the last 168 hours. Coagulation Profile: No results for input(s): INR, PROTIME in the last 168 hours. Cardiac Enzymes: No results for input(s): CKTOTAL, CKMB, CKMBINDEX, TROPONINI in the last 168 hours. BNP (last 3 results) No results for input(s): PROBNP in the last 8760 hours. HbA1C: No results for input(s): HGBA1C in the last 72 hours. CBG: Recent Labs  Lab 01/06/18 0617 01/06/18 0637  GLUCAP 56* 154*   Lipid Profile: No results for input(s): CHOL, HDL, LDLCALC, TRIG, CHOLHDL, LDLDIRECT in the last 72 hours. Thyroid Function Tests: No results for input(s): TSH, T4TOTAL, FREET4, T3FREE, THYROIDAB in the last 72 hours. Anemia Panel: No results for input(s): VITAMINB12, FOLATE, FERRITIN, TIBC, IRON, RETICCTPCT in the last 72 hours. Urine analysis: No results found for: COLORURINE, APPEARANCEUR, LABSPEC, PHURINE, GLUCOSEU, HGBUR, BILIRUBINUR, KETONESUR, PROTEINUR, UROBILINOGEN, NITRITE, LEUKOCYTESUR Sepsis Labs: @LABRCNTIP (procalcitonin:4,lacticidven:4)  )No results found for this or any previous visit (from the past 240 hour(s)).    Studies: Dg Chest Port 1 View  Result Date: 01/05/2018 CLINICAL DATA:  Line placement, preoperative for left above-the-knee amputation EXAM: PORTABLE  CHEST 1 VIEW COMPARISON:  01/01/2018 chest radiograph. FINDINGS: Right subclavian central venous catheter terminates in the middle third of the SVC. Spinal fusion hardware overlies the left lower thoracic spine. Stable cardiomediastinal silhouette with normal heart size. No pneumothorax. No pleural effusion. Nodular 1 cm opacity overlying peripheral mid to lower right lung, stable since 01/01/2018 radiograph, new since 03/09/2016 chest CT. No pulmonary edema. Stable mild scarring versus atelectasis at the left greater than right lung bases. IMPRESSION: 1. Nodular 1 cm opacity overlying the peripheral mid to lower right lung, new since 2017 chest CT. Cannot exclude pulmonary nodule. Recommend further evaluation chest CT. 2. Stable mild scarring at the left lung base. 3. Well-positioned right subclavian central venous catheter. Electronically Signed   By: Delbert Phenix M.D.   On: 01/05/2018 16:52    Scheduled Meds: . aspirin EC  81 mg Oral Daily  . atorvastatin  40 mg Oral q1800  . docusate sodium  100 mg Oral BID  . fentaNYL  75 mcg Transdermal Q72H  . [START ON 01/08/2018] heparin  5,000 Units Subcutaneous Q8H  . HYDROmorphone      .  methocarbamol      . midodrine  5 mg Oral TID WC    Continuous Infusions: . sodium chloride Stopped (01/05/18 1726)  . sodium chloride Stopped (01/05/18 1723)  . sodium chloride    . 0.9 % NaCl with KCl 20 mEq / L 75 mL/hr at 01/06/18 0554  .  ceFAZolin (ANCEF) IV    . magnesium sulfate 1 - 4 g bolus IVPB 50 mL/hr at 01/05/18 1800  . methocarbamol (ROBAXIN) IV    . potassium chloride 10 mEq (01/05/18 2258)  . vancomycin 1,000 mg (01/05/18 2307)     LOS: 1 day     Briant CedarNkeiruka J Ezenduka, MD [facility]   If 7PM-7AM, please contact night-coverage [email or web address] Password [email or web address] 01/06/2018, 11:52 AM

## 2018-01-06 NOTE — Progress Notes (Signed)
Hypoglycemic Event  CBG: 56  Treatment: 50mg  amp of D50  Symptoms: none  Follow-up CBG: [id number] CBG Result:156  Possible Reasons for Event: NPO at midnight  Comments/MD notified:No but RN receiving patient for surgery was informed    Kevin KluverBrown, Kevin Whitney

## 2018-01-06 NOTE — Progress Notes (Signed)
   [facility]  Assessment and Plan: Patient in need of venous access.   Tunneled central catheter for IV access Long term antibiotics Left AKA 8/25  ______________________________________________________________________   History of Present Illness: Kevin SimsDavid Whitney is a 57 y.o. male   Left foot gangrene Left AKA in OR 8/25  Need long term antibiotics Scheduled for tunneled central catheter placement in IR   Allergies and medications reviewed.   Review of Systems: A 12 point ROS discussed and pertinent positives are indicated in the HPI above.  All other systems are negative.   Vital Signs: BP 113/64 (BP Location: Left Arm)   Pulse 81   Temp 97.7 F (36.5 C) (Oral)   Resp 18   Wt 134 lb 4.8 oz (60.9 kg)   SpO2 95%   BMI 17.72 kg/m   Physical Exam  Constitutional: He is oriented to person, place, and time.  Cardiovascular: Normal rate and regular rhythm.  Pulmonary/Chest: Effort normal and breath sounds normal.  Abdominal: Soft. Bowel sounds are normal.  Musculoskeletal:  L AKA  Neurological: He is alert and oriented to person, place, and time.  Psychiatric: He has a normal mood and affect. His behavior is normal. Judgment and thought content normal.  Vitals reviewed.    Imaging reviewed.   Labs:  COAGS: No results for input(s): INR, APTT in the last 8760 hours.  BMP: Recent Labs    01/05/18 1506 01/06/18 0337  NA  --  141  K  --  3.5  CL  --  113*  CO2  --  24  GLUCOSE  --  72  BUN  --  <5*  CALCIUM  --  6.8*  CREATININE 0.53* 0.54*  GFRNONAA >60 >60  GFRAA >60 >60    Pt is aware of procedure tunneled central catheter placement Risks and benefits including but not limited to Infection; bleeding; vessel damage Agreeable to proceed Consent signed and in chart   Electronically Signed: Caelan Branden A, PA-C 01/06/2018, 11:16 AM   I spent a total of 15 minutes in face to face in clinical consultation, greater than 50%  of which was counseling/coordinating care for venous access.Patient ID: Kevin SimsDavid Rack, male   DOB: January 02, 1961, 57 y.o.   MRN: [id number]

## 2018-01-06 NOTE — Progress Notes (Signed)
Rehab Admissions Coordinator Note:  Patient was screened by Clois DupesBoyette, Eldor Conaway Godwin for appropriateness for an Inpatient Acute Rehab Consult per PT recommendation. .  At this time, we are recommending Inpatient Rehab consult. Please place order for consult.  Clois DupesBoyette, Jonathen Rathman Godwin 01/06/2018, 3:46 PM  I can be reached at [phone].

## 2018-01-06 NOTE — Anesthesia Preprocedure Evaluation (Signed)
Anesthesia Evaluation  Patient identified by MRN, date of birth, ID band Patient awake    Reviewed: Allergy & Precautions, NPO status , Patient's Chart, lab work & pertinent test results  Airway Mallampati: I  TM Distance: >3 FB Neck ROM: Full    Dental   Pulmonary Current Smoker,    Pulmonary exam normal        Cardiovascular + Peripheral Vascular Disease  Normal cardiovascular exam     Neuro/Psych    GI/Hepatic   Endo/Other    Renal/GU      Musculoskeletal   Abdominal   Peds  Hematology   Anesthesia Other Findings   Reproductive/Obstetrics                             Anesthesia Physical Anesthesia Plan  ASA: III  Anesthesia Plan: General   Post-op Pain Management:    Induction: Intravenous  PONV Risk Score and Plan: 1 and Treatment may vary due to age or medical condition and Ondansetron  Airway Management Planned: LMA  Additional Equipment:   Intra-op Plan:   Post-operative Plan: Extubation in OR  Informed Consent: I have reviewed the patients History and Physical, chart, labs and discussed the procedure including the risks, benefits and alternatives for the proposed anesthesia with the patient or authorized representative who has indicated his/her understanding and acceptance.     Plan Discussed with: CRNA and Surgeon  Anesthesia Plan Comments:         Anesthesia Quick Evaluation

## 2018-01-06 NOTE — Transfer of Care (Signed)
Immediate Anesthesia Transfer of Care Note  Patient: Kevin SimsDavid Whitney  Procedure(s) Performed: AMPUTATION ABOVE KNEE (Left )  Patient Location: PACU  Anesthesia Type:General  Level of Consciousness: awake, alert , oriented and patient cooperative  Airway & Oxygen Therapy: Patient Spontanous Breathing and Patient connected to nasal cannula oxygen  Post-op Assessment: Report given to RN, Post -op Vital signs reviewed and stable and Patient moving all extremities  Post vital signs: Reviewed and stable  Last Vitals:  Vitals Value Taken Time  BP 104/73 01/06/2018  8:48 AM  Temp    Pulse 101 01/06/2018  8:51 AM  Resp 14 01/06/2018  8:51 AM  SpO2 99 % 01/06/2018  8:51 AM  Vitals shown include unvalidated device data.  Last Pain:  Vitals:   01/06/18 0418  TempSrc: Oral  PainSc:          Complications: No apparent anesthesia complications

## 2018-01-06 NOTE — Evaluation (Signed)
Physical Therapy Evaluation Patient Details Name: Kevin Whitney MRN: [id number] DOB: 1961/04/24 Today's Date: 01/06/2018   History of Present Illness  Kevin Whitney is a 57 y.o. 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 via EMS due to dysuria and pressure ulcer of the left upper back which is chronic but developing a left lower back and sacral ulcer. Further work-up showed MRSA bacteremia, likely source his sacral ulcer.  Pt was also found to have LLE gangrene, CT arteriogram showed severe PVD, and was subsequently transferred to [facility] for AKA by Dr Lajoyce Corners.  Clinical Impression   Patient is s/p above surgery resulting in functional limitations due to the deficits listed below (see PT Problem List). Standby assist for transfers OOB to wheelchair, and am unsure of how much assist he needed for ADLs prior to admission; Presents with weakness, pain, decr skin integrity; Has lots of family assist at home, and seems to have developed quite a system for mobility at home; Noting overall good arms use and strength for transfers; very motivated to regain some independence; worth considering CIR to maximize independence and safety with mobility; Will place screen;  Patient will benefit from skilled PT to increase their independence and safety with mobility to allow discharge to the venue listed below.    It is noteworthy that [name] is quite talkative, and is at times hard to get questions answered clearly; Could use more detailed information re: PLOF/ADLs     Follow Up Recommendations CIR    Equipment Recommendations  Other (comment)(Air overlay for bed at home)    Recommendations for Other Services OT consult(as ordered)     Precautions / Restrictions  Precautions Precautions: Fall Precaution Comments: Contact Precautions      Mobility  Bed Mobility Overal bed mobility: Needs Assistance Bed Mobility: Rolling;Sidelying to Sit;Sit to Supine Rolling: Min guard(with rail) Sidelying to sit: Mod assist   Sit to supine: Min assist   General bed mobility comments: Good use of rails to roll; light mod assist to get sidelying to sit; cues for technqiue; min assist to help RLE back into bed  Transfers Overall transfer level: Needs assistance   Transfers: Lateral/Scoot Transfers          Lateral/Scoot Transfers: Min assist General transfer comment: Simulated lateral scoot transfer along EOB to scoot towards HOB; noted good use of UEs to support  Ambulation/Gait                Stairs            Wheelchair Mobility    Modified Rankin (Stroke Patients Only)       Balance Overall balance assessment: Needs assistance   Sitting balance-Leahy Scale: Fair                                       Pertinent Vitals/Pain Pain Assessment: Faces Faces Pain Scale: Hurts even more Pain Location: LLE and wounds  Pain Descriptors / Indicators: Grimacing;Guarding Pain Intervention(s): Premedicated before session    Home Living Family/patient expects to be discharged to:: Private residence Living Arrangements: Parent;Other relatives Available Help at Discharge: Family;Available PRN/intermittently(If not 24 hours, then near 24 hours) Type of Home: House Home Access: Level  entry(at back entrance)     Home Layout: Multi-level(sunken den and other architectural barriers) Home Equipment: Wheelchair - manual(fabricated trapeze over his bed; sliding board)      Prior Function Level of Independence: Needs assistance   Gait / Transfers Assistance Needed: transfers OOB to chair with trapeze; uses sliding board as well; It is apparent he hasn't walked in quite some time; pt's father (who is [employer] [facility]) provides minguard  assist to ensure safety with transfers  ADL's / Homemaking Assistance Needed: Assist prn; Unable to get details of ADLs  Comments: Mr. Rosko is brimming with stories about how he manages at home, is often tangential and at times unclear; will need ot get more details of ADLs, etc     Hand Dominance        Extremity/Trunk Assessment   Upper Extremity Assessment Upper Extremity Assessment: Defer to OT evaluation    Lower Extremity Assessment Lower Extremity Assessment: RLE deficits/detail;LLE deficits/detail RLE Deficits / Details: Generally weak and with noted decr muscle mass; Still, noted active, voluntary motion to flex and extend hip and knee; noted sore at heel, RN is aware; floated heel RLE Coordination: decreased fine motor;decreased gross motor LLE Deficits / Details: AKA; pt reported some phantom sensation; initiated desensitization training       Communication   Communication: No difficulties(tangetial)  Cognition Arousal/Alertness: Awake/alert Behavior During Therapy: WFL for tasks assessed/performed;Impulsive Overall Cognitive Status: Difficult to assess                                 General Comments: Kevin Whitney talks a lot, and is enthusiastic about sharing is stories; Often tangential; Needs rediraection to questions at hand; difficult to get questions asked, even simple orientation questions      General Comments General comments (skin integrity, edema, etc.): Wounds on his back are dressed with foam dressings; Initiated training for pressure relief -- he will need reinforcement    Exercises     Assessment/Plan    PT Assessment Patient needs continued PT services  PT Problem List Decreased strength;Decreased range of motion;Decreased activity tolerance;Decreased balance;Decreased mobility;Decreased coordination;Decreased cognition;Decreased knowledge of use of DME;Decreased safety awareness;Decreased knowledge of precautions;Decreased skin  integrity;Pain       PT Treatment Interventions DME instruction;Functional mobility training;Therapeutic activities;Therapeutic exercise;Balance training;Neuromuscular re-education;Cognitive remediation;Patient/family education;Wheelchair mobility training    PT Goals (Current goals can be found in the Care Plan section)  Acute Rehab PT Goals Patient Stated Goal: Hopes to get home PT Goal Formulation: With patient Time For Goal Achievement: 01/20/18 Potential to Achieve Goals: Good    Frequency Min 3X/week   Barriers to discharge        Co-evaluation               AM-PAC PT "6 Clicks" Daily Activity  Outcome Measure Difficulty turning over in bed (including adjusting bedclothes, sheets and blankets)?: None Difficulty moving from lying on back to sitting on the side of the bed? : A Lot Difficulty sitting down on and standing up from a chair with arms (e.g., wheelchair, bedside commode, etc,.)?: Unable Help needed moving to and from a bed to chair (including a wheelchair)?: Total Help needed walking in hospital room?: Total Help needed climbing 3-5 steps with a railing? : Total 6 Click Score: 10    End of Session   Activity Tolerance: Patient tolerated treatment well Patient left: in bed;with call bell/phone within reach;with family/visitor present Nurse Communication:  Mobility status PT Visit Diagnosis: Muscle weakness (generalized) (M62.81);Other abnormalities of gait and mobility (R26.89);Other symptoms and signs involving the nervous system (R29.898);Pain Pain - Right/Left: Left Pain - part of body: Leg    Time: [id number] PT Time Calculation (min) (ACUTE ONLY): 29 min   Charges:   PT Evaluation $PT Eval Moderate Complexity: 1 Mod PT Treatments $Therapeutic Activity: 8-22 mins        Van ClinesHolly Martita Brumm, PT  Acute Rehabilitation Services Pager [phone] Office [phone]   Levi AlandHolly H Garrell Flagg 01/06/2018, 3:14 PM

## 2018-01-06 NOTE — Anesthesia Procedure Notes (Signed)
Procedure Name: LMA Insertion Date/Time: 01/06/2018 8:09 AM Performed by: Lucinda Dellecarlo, Algis Lehenbauer M, CRNA Pre-anesthesia Checklist: Patient identified, Emergency Drugs available, Patient being monitored and Suction available Patient Re-evaluated:Patient Re-evaluated prior to induction Oxygen Delivery Method: Circle system utilized Preoxygenation: Pre-oxygenation with 100% oxygen Induction Type: IV induction LMA: LMA inserted LMA Size: 5.0 Tube type: Oral Number of attempts: 1 Placement Confirmation: positive ETCO2 and breath sounds checked- equal and bilateral Tube secured with: Tape Dental Injury: Teeth and Oropharynx as per pre-operative assessment

## 2018-01-07 ENCOUNTER — Encounter (HOSPITAL_COMMUNITY)
Admission: AD | Disposition: A | Discharge status: Skilled Nursing Facility | Payer: Self-pay | Attending: Internal Medicine

## 2018-01-07 ENCOUNTER — Inpatient Hospital Stay (HOSPITAL_COMMUNITY): Payer: Medicare Other | Admitting: Anesthesiology

## 2018-01-07 ENCOUNTER — Inpatient Hospital Stay (HOSPITAL_COMMUNITY): Payer: Medicare Other

## 2018-01-07 ENCOUNTER — Encounter (HOSPITAL_COMMUNITY): Payer: Self-pay | Admitting: Orthopedic Surgery

## 2018-01-07 DIAGNOSIS — M51379 Other intervertebral disc degeneration, lumbosacral region without mention of lumbar back pain or lower extremity pain: Secondary | ICD-10-CM

## 2018-01-07 DIAGNOSIS — I70262 Atherosclerosis of native arteries of extremities with gangrene, left leg: Principal | ICD-10-CM

## 2018-01-07 DIAGNOSIS — D72829 Elevated white blood cell count, unspecified: Secondary | ICD-10-CM

## 2018-01-07 DIAGNOSIS — Z8739 Personal history of other diseases of the musculoskeletal system and connective tissue: Secondary | ICD-10-CM

## 2018-01-07 DIAGNOSIS — F1120 Opioid dependence, uncomplicated: Secondary | ICD-10-CM

## 2018-01-07 DIAGNOSIS — D62 Acute posthemorrhagic anemia: Secondary | ICD-10-CM

## 2018-01-07 DIAGNOSIS — M5137 Other intervertebral disc degeneration, lumbosacral region: Secondary | ICD-10-CM

## 2018-01-07 DIAGNOSIS — L0231 Cutaneous abscess of buttock: Secondary | ICD-10-CM

## 2018-01-07 DIAGNOSIS — G894 Chronic pain syndrome: Secondary | ICD-10-CM

## 2018-01-07 DIAGNOSIS — R7881 Bacteremia: Secondary | ICD-10-CM

## 2018-01-07 DIAGNOSIS — L89159 Pressure ulcer of sacral region, unspecified stage: Secondary | ICD-10-CM

## 2018-01-07 DIAGNOSIS — I5032 Chronic diastolic (congestive) heart failure: Secondary | ICD-10-CM

## 2018-01-07 HISTORY — PX: DEBRIDMENT OF DECUBITUS ULCER: SHX6276

## 2018-01-07 HISTORY — PX: IR US GUIDE VASC ACCESS RIGHT: IMG2390

## 2018-01-07 HISTORY — PX: IR FLUORO GUIDE CV LINE RIGHT: IMG2283

## 2018-01-07 LAB — BASIC METABOLIC PANEL
Anion gap: 4 — ABNORMAL LOW (ref 5–15)
BUN: <5 mg/dL — ABNORMAL LOW (ref 6–20)
CALCIUM: 6.7 mg/dL — AB (ref 8.9–10.3)
CO2: 25 mmol/L (ref 22–32)
CREATININE: 0.5 mg/dL — AB (ref 0.61–1.24)
Chloride: 111 mmol/L (ref 98–111)
GFR calc non Af Amer: >60 mL/min (ref >60)
Glucose, Bld: 76 mg/dL (ref 70–99)
Potassium: 3.9 mmol/L (ref 3.5–5.1)
SODIUM: 140 mmol/L (ref 135–145)

## 2018-01-07 LAB — CBC WITH DIFFERENTIAL/PLATELET
Abs Immature Granulocytes: 0.1 10*3/uL (ref 0.0–0.1)
BASOS PCT: 1 %
Basophils Absolute: 0.1 10*3/uL (ref 0.0–0.1)
EOS ABS: 1.2 10*3/uL — AB (ref 0.0–0.7)
EOS PCT: 9 %
HCT: 27.6 % — ABNORMAL LOW (ref 39.0–52.0)
Hemoglobin: 8.5 g/dL — ABNORMAL LOW (ref 13.0–17.0)
Immature Granulocytes: 1 %
LYMPHS PCT: 14 %
Lymphs Abs: 1.9 10*3/uL (ref 0.7–4.0)
MCH: 28.3 pg (ref 26.0–34.0)
MCHC: 30.8 g/dL (ref 30.0–36.0)
MCV: 92 fL (ref 78.0–100.0)
Monocytes Absolute: 0.8 10*3/uL (ref 0.1–1.0)
Monocytes Relative: 6 %
Neutro Abs: 9.5 10*3/uL — ABNORMAL HIGH (ref 1.7–7.7)
Neutrophils Relative %: 69 %
Platelets: 319 10*3/uL (ref 150–400)
RBC: 3 MIL/uL — AB (ref 4.22–5.81)
RDW: 15.9 % — ABNORMAL HIGH (ref 11.5–15.5)
WBC: 13.6 10*3/uL — AB (ref 4.0–10.5)

## 2018-01-07 LAB — MAGNESIUM: MAGNESIUM: 2.3 mg/dL (ref 1.7–2.4)

## 2018-01-07 LAB — GLUCOSE, CAPILLARY: Glucose-Capillary: 54 mg/dL — ABNORMAL LOW (ref 70–99)

## 2018-01-07 SURGERY — DEBRIDMENT OF DECUBITUS ULCER
Anesthesia: General | Site: Back

## 2018-01-07 MED ORDER — ENSURE ENLIVE PO LIQD
237.0000 mL | Freq: Three times a day (TID) | ORAL | Status: DC
  Administered 2018-01-07 – 2018-01-16 (×27): 237 mL via ORAL

## 2018-01-07 MED ORDER — LIDOCAINE HCL 1 % IJ SOLN
INTRAMUSCULAR | Status: DC | PRN
  Administered 2018-01-07: 5 mL

## 2018-01-07 MED ORDER — LIDOCAINE HCL 1 % IJ SOLN
INTRAMUSCULAR | Status: AC
  Filled 2018-01-07: qty 20

## 2018-01-07 MED ORDER — DEXTROSE 50 % IV SOLN
INTRAVENOUS | Status: AC
  Administered 2018-01-07: 09:00:00
  Filled 2018-01-07: qty 50

## 2018-01-07 MED ORDER — CHLORHEXIDINE GLUCONATE 4 % EX LIQD
CUTANEOUS | Status: DC | PRN
  Administered 2018-01-07: 1 via TOPICAL

## 2018-01-07 MED ORDER — PRO-STAT SUGAR FREE PO LIQD
30.0000 mL | Freq: Two times a day (BID) | ORAL | Status: DC
  Administered 2018-01-07 – 2018-01-16 (×14): 30 mL via ORAL
  Filled 2018-01-07 (×18): qty 30

## 2018-01-07 MED ORDER — ADULT MULTIVITAMIN W/MINERALS CH
1.0000 | ORAL_TABLET | Freq: Every day | ORAL | Status: DC
  Administered 2018-01-07 – 2018-01-16 (×10): 1 via ORAL
  Filled 2018-01-07 (×12): qty 1

## 2018-01-07 MED ORDER — ROCURONIUM BROMIDE 50 MG/5ML IV SOSY
PREFILLED_SYRINGE | INTRAVENOUS | Status: AC
  Filled 2018-01-07: qty 5

## 2018-01-07 MED ORDER — PROPOFOL 10 MG/ML IV BOLUS
INTRAVENOUS | Status: DC | PRN
  Administered 2018-01-07: 120 mg via INTRAVENOUS

## 2018-01-07 MED ORDER — BUPIVACAINE-EPINEPHRINE (PF) 0.25% -1:200000 IJ SOLN
INTRAMUSCULAR | Status: AC
  Filled 2018-01-07: qty 30

## 2018-01-07 MED ORDER — PROPOFOL 10 MG/ML IV BOLUS
INTRAVENOUS | Status: AC
  Filled 2018-01-07: qty 20

## 2018-01-07 MED ORDER — MIDAZOLAM HCL 5 MG/5ML IJ SOLN
INTRAMUSCULAR | Status: DC | PRN
  Administered 2018-01-07: 2 mg via INTRAVENOUS

## 2018-01-07 MED ORDER — PHENYLEPHRINE 40 MCG/ML (10ML) SYRINGE FOR IV PUSH (FOR BLOOD PRESSURE SUPPORT)
PREFILLED_SYRINGE | INTRAVENOUS | Status: AC
  Filled 2018-01-07: qty 10

## 2018-01-07 MED ORDER — FENTANYL CITRATE (PF) 100 MCG/2ML IJ SOLN: 25.0000 ug | INTRAMUSCULAR | Status: DC | PRN

## 2018-01-07 MED ORDER — DEXAMETHASONE SODIUM PHOSPHATE 10 MG/ML IJ SOLN
INTRAMUSCULAR | Status: AC
  Filled 2018-01-07: qty 1

## 2018-01-07 MED ORDER — FENTANYL CITRATE (PF) 100 MCG/2ML IJ SOLN
INTRAMUSCULAR | Status: DC | PRN
  Administered 2018-01-07: 50 ug via INTRAVENOUS

## 2018-01-07 MED ORDER — DEXTROSE-NACL 5-0.45 % IV SOLN
INTRAVENOUS | Status: DC
  Administered 2018-01-07: 11:00:00 via INTRAVENOUS

## 2018-01-07 MED ORDER — CHLORHEXIDINE GLUCONATE 4 % EX LIQD
CUTANEOUS | Status: AC
  Filled 2018-01-07: qty 15

## 2018-01-07 MED ORDER — LIDOCAINE 2% (20 MG/ML) 5 ML SYRINGE
INTRAMUSCULAR | Status: AC
  Filled 2018-01-07: qty 5

## 2018-01-07 MED ORDER — LACTATED RINGERS IV SOLN
INTRAVENOUS | Status: DC
  Administered 2018-01-07: 12:00:00 via INTRAVENOUS

## 2018-01-07 MED ORDER — 0.9 % SODIUM CHLORIDE (POUR BTL) OPTIME
TOPICAL | Status: DC | PRN
  Administered 2018-01-07: 1000 mL

## 2018-01-07 MED ORDER — OXYCODONE HCL 5 MG PO TABS: 5.0000 mg | ORAL_TABLET | Freq: Once | ORAL | Status: DC | PRN

## 2018-01-07 MED ORDER — OXYCODONE HCL 5 MG/5ML PO SOLN: 5.0000 mg | Freq: Once | ORAL | Status: DC | PRN

## 2018-01-07 MED ORDER — MIDAZOLAM HCL 2 MG/2ML IJ SOLN
INTRAMUSCULAR | Status: AC
  Filled 2018-01-07: qty 2

## 2018-01-07 MED ORDER — ONDANSETRON HCL 4 MG/2ML IJ SOLN: 4.0000 mg | Freq: Four times a day (QID) | INTRAMUSCULAR | Status: DC | PRN

## 2018-01-07 MED ORDER — ROCURONIUM BROMIDE 50 MG/5ML IV SOSY
PREFILLED_SYRINGE | INTRAVENOUS | Status: DC | PRN
  Administered 2018-01-07: 40 mg via INTRAVENOUS

## 2018-01-07 MED ORDER — ONDANSETRON HCL 4 MG/2ML IJ SOLN
INTRAMUSCULAR | Status: AC
  Filled 2018-01-07: qty 2

## 2018-01-07 MED ORDER — DEXAMETHASONE SODIUM PHOSPHATE 10 MG/ML IJ SOLN
INTRAMUSCULAR | Status: DC | PRN
  Administered 2018-01-07: 10 mg via INTRAVENOUS

## 2018-01-07 MED ORDER — FENTANYL CITRATE (PF) 250 MCG/5ML IJ SOLN
INTRAMUSCULAR | Status: AC
  Filled 2018-01-07: qty 5

## 2018-01-07 MED ORDER — LACTATED RINGERS IV SOLN
INTRAVENOUS | Status: DC | PRN
  Administered 2018-01-07: 11:00:00 via INTRAVENOUS

## 2018-01-07 SURGICAL SUPPLY — 31 items
BENZOIN TINCTURE PRP APPL 2/3 (GAUZE/BANDAGES/DRESSINGS) ×2 IMPLANT
BLADE CLIPPER SURG (BLADE) IMPLANT
CANISTER SUCT 3000ML PPV (MISCELLANEOUS) ×2 IMPLANT
COVER SURGICAL LIGHT HANDLE (MISCELLANEOUS) ×2 IMPLANT
DRAPE LAPAROTOMY T 102X78X121 (DRAPES) ×2 IMPLANT
DRAPE UTILITY XL STRL (DRAPES) ×4 IMPLANT
ELECT CAUTERY BLADE 6.4 (BLADE) ×2 IMPLANT
ELECT REM PT RETURN 9FT ADLT (ELECTROSURGICAL) ×2
ELECTRODE REM PT RTRN 9FT ADLT (ELECTROSURGICAL) ×1 IMPLANT
GAUZE SPONGE 4X4 12PLY STRL (GAUZE/BANDAGES/DRESSINGS) IMPLANT
GLOVE BIO SURGEON STRL SZ7 (GLOVE) ×2 IMPLANT
GLOVE BIOGEL PI IND STRL 7.5 (GLOVE) ×1 IMPLANT
GLOVE BIOGEL PI INDICATOR 7.5 (GLOVE) ×1
GOWN STRL REUS W/ TWL LRG LVL3 (GOWN DISPOSABLE) ×2 IMPLANT
GOWN STRL REUS W/TWL LRG LVL3 (GOWN DISPOSABLE) ×2
KIT BASIN OR (CUSTOM PROCEDURE TRAY) ×2 IMPLANT
KIT TURNOVER KIT B (KITS) ×2 IMPLANT
NEEDLE HYPO 25GX1X1/2 BEV (NEEDLE) ×2 IMPLANT
NS IRRIG 1000ML POUR BTL (IV SOLUTION) ×2 IMPLANT
PACK SURGICAL SETUP 50X90 (CUSTOM PROCEDURE TRAY) ×2 IMPLANT
PAD ARMBOARD 7.5X6 YLW CONV (MISCELLANEOUS) ×4 IMPLANT
PENCIL BUTTON HOLSTER BLD 10FT (ELECTRODE) ×2 IMPLANT
SPECIMEN JAR SMALL (MISCELLANEOUS) ×2 IMPLANT
SPONGE LAP 18X18 X RAY DECT (DISPOSABLE) ×2 IMPLANT
SUT ETHILON 2 0 FS 18 (SUTURE) IMPLANT
SUT VIC AB 3-0 SH 18 (SUTURE) IMPLANT
SYR CONTROL 10ML LL (SYRINGE) ×2 IMPLANT
TOWEL OR 17X24 6PK STRL BLUE (TOWEL DISPOSABLE) ×2 IMPLANT
TOWEL OR 17X26 10 PK STRL BLUE (TOWEL DISPOSABLE) ×2 IMPLANT
TUBE CONNECTING 12X1/4 (SUCTIONS) ×2 IMPLANT
YANKAUER SUCT BULB TIP NO VENT (SUCTIONS) ×2 IMPLANT

## 2018-01-07 NOTE — Progress Notes (Signed)
Patient ID: Kevin SimsDavid Whitney, male   DOB: 12-30-1960, 57 y.o.   MRN: [id number] Patient is a 57 year old gentleman who is postoperative day 1 left above-the-knee amputation.  Patient has no complaints this morning.  There is no drainage in the wound VAC canister.  Physical therapy determine appropriateness for discharge to home versus discharge to skilled nursing.

## 2018-01-07 NOTE — Progress Notes (Signed)
1 Day Post-Op   Subjective/Chief Complaint: Doing well s/p left BKA    Objective: Vital signs in last 24 hours: Temp:  [97 F (36.1 C)-98.8 F (37.1 C)] 98.8 F (37.1 C) (08/26 0428) Pulse Rate:  [81-102] 85 (08/26 0428) Resp:  [13-19] 18 (08/26 0428) BP: (94-119)/(64-82) 94/82 (08/26 0428) SpO2:  [95 %-100 %] 100 % (08/26 0428) Last BM Date: 01/06/18  Intake/Output from previous day: 08/25 0701 - 08/26 0700 In: 5872.1 [P.O.:780; I.V.:1949.3; IV Piggyback:3142.8] Out: 2260 [Urine:2260] Intake/Output this shift: Total I/O In: -  Out: 350 [Urine:350]  Deferred - draining necrotic sacral decubitus 4 cm per WOCN description.    Lab Results:  Recent Labs    01/06/18 0337 01/07/18 0325  WBC 13.4* 13.6*  HGB 9.9* 8.5*  HCT 31.7* 27.6*  PLT 276 319   BMET Recent Labs    01/06/18 0337 01/07/18 0325  NA 141 140  Kevin 3.5 3.9  CL 113* 111  CO2 24 25  GLUCOSE 72 76  BUN <5* <5*  CREATININE 0.54* 0.50*  CALCIUM 6.8* 6.7*   PT/INR No results for input(s): LABPROT, INR in the last 72 hours. ABG No results for input(s): PHART, HCO3 in the last 72 hours.  Invalid input(s): PCO2, PO2  Studies/Results: Dg Chest Port 1 View  Result Date: 01/05/2018 CLINICAL DATA:  Line placement, preoperative for left above-the-knee amputation EXAM: PORTABLE CHEST 1 VIEW COMPARISON:  01/01/2018 chest radiograph. FINDINGS: Right subclavian central venous catheter terminates in the middle third of the SVC. Spinal fusion hardware overlies the left lower thoracic spine. Stable cardiomediastinal silhouette with normal heart size. No pneumothorax. No pleural effusion. Nodular 1 cm opacity overlying peripheral mid to lower right lung, stable since 01/01/2018 radiograph, new since 03/09/2016 chest CT. No pulmonary edema. Stable mild scarring versus atelectasis at the left greater than right lung bases. IMPRESSION: 1. Nodular 1 cm opacity overlying the peripheral mid to lower right lung, new since  2017 chest CT. Cannot exclude pulmonary nodule. Recommend further evaluation chest CT. 2. Stable mild scarring at the left lung base. 3. Well-positioned right subclavian central venous catheter. Electronically Signed   By: Delbert Phenix M.D.   On: 01/05/2018 16:52    Anti-infectives: Anti-infectives (From admission, onward)   Start     Dose/Rate Route Frequency Ordered Stop   01/06/18 1400  ceFAZolin (ANCEF) IVPB 1 g/50 mL premix     1 g 100 mL/hr over 30 Minutes Intravenous Every 6 hours 01/06/18 1001 01/07/18 0318   01/06/18 0715  ceFAZolin (ANCEF) IVPB 2g/100 mL premix  Status:  Discontinued     2 g 200 mL/hr over 30 Minutes Intravenous To ShortStay Surgical 01/05/18 1921 01/06/18 0942   01/05/18 2300  vancomycin (VANCOCIN) IVPB 1000 mg/200 mL premix     1,000 mg 200 mL/hr over 60 Minutes Intravenous Every 12 hours 01/05/18 1453     01/05/18 1530  cephALEXin (KEFLEX) capsule 500 mg  Status:  Discontinued     500 mg Oral Every 8 hours 01/05/18 1457 01/06/18 1001      Assessment/Plan: Infected necrotic sacral decubitus ulcer -  Plan debridement in OR today under general anesthesia.  The surgical procedure has been discussed with the patient.  Potential risks, benefits, alternative treatments, and expected outcomes have been explained.  All of the patient's questions at this time have been answered.  The likelihood of reaching the patient's treatment goal is good.  The patient understand the proposed surgical procedure and wishes to proceed.  LOS: 2 days    Kevin Whitney Kevin Whitney 01/07/2018

## 2018-01-07 NOTE — NC FL2 (Signed)
[place] MEDICAID FL2 LEVEL OF CARE SCREENING TOOL     IDENTIFICATION  Patient Name: Kevin Whitney Birthdate: June 13, 1960 Sex: male Admission Date (Current Location): 01/05/2018  [facility] and [place] Number:  Producer, television/film/video and Address:  [address]. [facility], [address], [place], [place] [address]      Provider Number: [id number]  Attending Physician Name and Address:  Briant Cedar, MD  Relative Name and Phone Number:       Current Level of Care: Hospital Recommended Level of Care: Skilled Nursing Facility Prior Approval Number:    Date Approved/Denied:   PASRR Number:    Discharge Plan: SNF    Current Diagnoses: Patient Active Problem List   Diagnosis Date Noted  . Bacteremia   . Pressure injury of skin of sacral region   . DDD (degenerative disc disease), lumbosacral   . Chronic pain syndrome   . Leukocytosis   . Acute blood loss anemia   . Left buttock abscess 01/05/2018  . Stage III pressure ulcer of buttock (HCC) 01/05/2018  . MRSA bacteremia 01/05/2018  . Atherosclerosis of native artery of left lower extremity with gangrene (HCC) 01/05/2018  . Antibiotic-associated diarrhea 01/05/2018  . Proteus infection 01/05/2018  . Acute lower UTI 01/05/2018  . Pressure ulcer of left upper back, stage 3 (HCC) 01/05/2018  . Hypokalemia 01/05/2018  . H/O degenerative disc disease 01/05/2018  . Chronic narcotic dependence (HCC) 01/05/2018  . Disuse atrophy of muscle 01/05/2018  . Tobacco abuse counseling 01/05/2018  . Right middle lobe pulmonary nodule 01/05/2018  . Chronic diastolic CHF (congestive heart failure) (HCC) 01/05/2018  . Non-healing ulcer of groin with fat layer exposed (HCC) 01/05/2018  . Gangrene of left foot (HCC) 01/05/2018  . Severe protein-calorie malnutrition (HCC)   . DDD (degenerative disc disease), lumbar 11/02/2015  . Facet syndrome, lumbar 11/02/2015  . Sacroiliac joint dysfunction 11/02/2015    Orientation  RESPIRATION BLADDER Height & Weight     Self, Time, Situation, Place  Normal Continent Weight: 134 lb 4.8 oz (60.9 kg) Height:  6\' 1"  (185.4 cm)  BEHAVIORAL SYMPTOMS/MOOD NEUROLOGICAL BOWEL NUTRITION STATUS      Continent Diet(regular diet, thin liquids)  AMBULATORY STATUS COMMUNICATION OF NEEDS Skin   Limited Assist Verbally PU Stage and Appropriate Care, Wound Vac(Closed incision left leg, wound vac. Closed incision on back, guaze dressing. Open wound, mid sacrum)     PU Stage 3 Dressing: (Back)                 Personal Care Assistance Level of Assistance  Dressing, Bathing, Feeding Bathing Assistance: Limited assistance Feeding assistance: Independent Dressing Assistance: Limited assistance     Functional Limitations Info  Sight, Hearing, Speech Sight Info: Adequate Hearing Info: Adequate Speech Info: Adequate    SPECIAL CARE FACTORS FREQUENCY  PT (By licensed PT), OT (By licensed OT)     PT Frequency: 3x OT Frequency: 3x            Contractures Contractures Info: Not present    Additional Factors Info  Code Status, Allergies Code Status Info: Full Code Allergies Info: No known allergies           Current Medications (01/07/2018):  This is the current hospital active medication list Current Facility-Administered Medications  Medication Dose Route Frequency Provider Last Rate Last Dose  . 0.9 %  sodium chloride infusion   Intravenous PRN Nadara Mustard, MD   Stopped at 01/05/18 1726  . 0.9 %  sodium chloride infusion   Intravenous PRN Nadara Mustarduda, Marcus V, MD   Stopped at 01/06/18 [phone]  . 0.9 %  sodium chloride infusion   Intravenous Continuous Nadara Mustarduda, Marcus V, MD 10 mL/hr at 01/06/18 1846    . aspirin EC tablet 81 mg  81 mg Oral Daily Nadara Mustarduda, Marcus V, MD   81 mg at 01/06/18 1233  . atorvastatin (LIPITOR) tablet 40 mg  40 mg Oral q1800 Nadara Mustarduda, Marcus V, MD   40 mg at 01/06/18 1855  . bisacodyl (DULCOLAX) suppository 10 mg  10 mg Rectal Daily PRN Nadara Mustarduda, Marcus V, MD       . chlorhexidine (HIBICLENS) 4 % liquid           . chlorhexidine (HIBICLENS) 4 % liquid    PRN Gilmer MorWagner, Jaime, DO   1 application at 01/07/18 1000  . cyclobenzaprine (FLEXERIL) tablet 10 mg  10 mg Oral TID PRN Nadara Mustarduda, Marcus V, MD      . dextrose 5 %-0.45 % sodium chloride infusion   Intravenous Continuous Briant CedarEzenduka, Nkeiruka J, MD 75 mL/hr at 01/07/18 1056    . diazepam (VALIUM) tablet 10 mg  10 mg Oral TID PRN Nadara Mustarduda, Marcus V, MD      . docusate sodium (COLACE) capsule 100 mg  100 mg Oral BID Nadara Mustarduda, Marcus V, MD   100 mg at 01/06/18 2033  . fentaNYL (DURAGESIC - dosed mcg/hr) patch 75 mcg  75 mcg Transdermal Q72H Nadara Mustarduda, Marcus V, MD   75 mcg at 01/05/18 1607  . [START ON 01/08/2018] heparin injection 5,000 Units  5,000 Units Subcutaneous Q8H Ralene Muskraturpin, Pamela, PA-C      . lactated ringers infusion   Intravenous Continuous Willette AlmaWoodrum, Chelsey L, MD 10 mL/hr at 01/07/18 1157    . lidocaine (XYLOCAINE) 1 % (with pres) injection           . lidocaine (XYLOCAINE) 1 % (with pres) injection   Infiltration PRN Gilmer MorWagner, Jaime, DO   5 mL at 01/07/18 1000  . loperamide (IMODIUM) capsule 4 mg  4 mg Oral PRN Nadara Mustarduda, Marcus V, MD      . magnesium citrate solution 1 Bottle  1 Bottle Oral Once PRN Nadara Mustarduda, Marcus V, MD      . methocarbamol (ROBAXIN) tablet 500 mg  500 mg Oral Q6H PRN Nadara Mustarduda, Marcus V, MD   500 mg at 01/07/18 1525   Or  . methocarbamol (ROBAXIN) 500 mg in dextrose 5 % 50 mL IVPB  500 mg Intravenous Q6H PRN Nadara Mustarduda, Marcus V, MD      . metoCLOPramide (REGLAN) tablet 5-10 mg  5-10 mg Oral Q8H PRN Nadara Mustarduda, Marcus V, MD       Or  . metoCLOPramide (REGLAN) injection 5-10 mg  5-10 mg Intravenous Q8H PRN Nadara Mustarduda, Marcus V, MD      . midodrine (PROAMATINE) tablet 5 mg  5 mg Oral TID WC Nadara Mustarduda, Marcus V, MD   5 mg at 01/07/18 0843  . ondansetron (ZOFRAN) tablet 4 mg  4 mg Oral Q6H PRN Nadara Mustarduda, Marcus V, MD       Or  . ondansetron [facility](ZOFRAN) injection 4 mg  4 mg Intravenous Q6H PRN Nadara Mustarduda, Marcus V, MD      . oxyCODONE (Oxy  IR/ROXICODONE) immediate release tablet 10 mg  10 mg Oral Q3H PRN Nadara Mustarduda, Marcus V, MD   10 mg at 01/07/18 1526  . polyethylene glycol (MIRALAX / GLYCOLAX) packet 17 g  17 g Oral Daily PRN Nadara Mustarduda, Marcus V, MD      .  potassium chloride 10 mEq in 50 mL *CENTRAL LINE* IVPB  10 mEq Intravenous BID Nadara Mustard, MD 50 mL/hr at 01/07/18 1056 10 mEq at 01/07/18 1056  . sodium chloride flush (NS) 0.9 % injection 10-40 mL  10-40 mL Intracatheter PRN Briant Cedar, MD   10 mL at 01/06/18 1632  . vancomycin (VANCOCIN) IVPB 1000 mg/200 mL premix  1,000 mg Intravenous Q12H Nadara Mustard, MD 200 mL/hr at 01/06/18 2215 1,000 mg at 01/07/18 1245     Discharge Medications: Please see discharge summary for a list of discharge medications.  Relevant Imaging Results:  Relevant Lab Results:   Additional Information SSN: [id number]  Maree Krabbe, LCSW

## 2018-01-07 NOTE — Progress Notes (Signed)
Initial Nutrition Assessment  DOCUMENTATION CODES:   Not applicable  INTERVENTION:   -Ensure Enlive po TID, each supplement provides 350 kcal and 20 grams of protein -30 ml Prostat BID, each supplement provides 100 kcals and 15 grams protein -MVI with minerals daily  NUTRITION DIAGNOSIS:   Increased nutrient needs related to wound healing as evidenced by estimated needs.  GOAL:   Patient will meet greater than or equal to 90% of their needs  MONITOR:   PO intake, Supplement acceptance, Labs, Weight trends, Skin, I & O's  REASON FOR ASSESSMENT:   Consult Assessment of nutrition requirement/status, Diet education, Wound healing  ASSESSMENT:   Kevin Whitney is a 57 y.o. 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  Pt admitted with atherosclerosis of native artery of lt lower extremity with gangrene.   8/25- s/p lt AKA, with wound vac, s/p central line for IV access; s/p CWOCN eval- unstageable rt heel, full thickness rt thigh/sacrum, satellite wounds on back superior to sacral wound 8/26- s/p I&D of sacral ulcer  Pt in OR at time of visit. Unable to obtain further nutrition hx or obtain nutrition-focused physical exam. Per H&P, pt with diffuse muscle atrophy, so nutrition-focused physical exam findings when completed may be related to disease state.   Pt with fair meal completion 25-75%. He has increased nutrient needs related to wound healing.   Pt at high risk for malnutrition, however, unable to identify at this time. Albumin has a half-life of 21 days and is strongly affected by stress  response and inflammatory process, therefore, do not expect to see an improvement in this lab value during acute hospitalization. When a patient presents with low albumin, it is likely skewed due to the acute inflammatory response.  Unless it is suspected that patient had poor PO intake or malnutrition prior to admission, then RD should not be consulted solely for low albumin. Note that low albumin is no longer used to diagnose malnutrition;  uses the new malnutrition guidelines published by the American Society for Parenteral and Enteral Nutrition (A.S.P.E.N.) and the Academy of Nutrition and Dietetics (AND).    Labs reviewed: CBGS: 54.   NUTRITION - FOCUSED PHYSICAL EXAM:    Most Recent Value  Orbital Region  Unable to assess  Upper Arm Region  Unable to assess  Thoracic and Lumbar Region  Unable to assess  Buccal Region  Unable to assess  Temple Region  Unable to assess  Clavicle Bone Region  Unable to assess  Clavicle and Acromion Bone Region  Unable to assess  Scapular Bone Region  Unable to assess  Dorsal Hand  Unable to assess  Patellar Region  Unable to assess  Anterior Thigh Region  Unable to assess  Posterior Calf Region  Unable to assess  Edema (RD Assessment)  Unable to assess  Hair  Unable to assess  Eyes  Unable to assess  Mouth  Unable to assess  Skin  Unable to assess  Nails  Unable to assess       Diet Order:   Diet Order  Diet regular Room service appropriate? Yes; Fluid consistency: Thin  Diet effective now              EDUCATION NEEDS:   No education needs have been identified at this time  Skin:  Skin Assessment: Skin Integrity Issues: Skin Integrity Issues:: Wound VAC, Unstageable, Other (Comment) Unstageable: rt heel Wound Vac: lt AKA Other: full thickness rt thigh/sacrum, satellite wiunds on back superior to sacrum  Last BM:  01/06/18  Height:   Ht Readings from Last 1 Encounters:  01/07/18 6\' 1"  (1.854 m)    Weight:    Wt Readings from Last 1 Encounters:  01/07/18 60.9 kg    Ideal Body Weight:  71.2 kg  BMI:  Body mass index is 17.72 kg/m.  Estimated Nutritional Needs:   Kcal:  1850-2050  Protein:  120-135 grams  Fluid:  >1.8 L    Corben Auzenne A. Mayford KnifeWilliams, RD, LDN, CDE Pager: [phone] After hours Pager: [phone]

## 2018-01-07 NOTE — Progress Notes (Signed)
PROGRESS NOTE  Kevin Whitney ZOX:[id number] DOB: June 10, 1960 DOA: 01/05/2018 PCP: [name], MD  HPI/Recap of past 24 hours: Kevin Whitney is a 57 y.o. male with medical history significant for PVD, tobacco abuse, degenerative disc disease of the spine status post operation, disuse atrophy of his muscles, history of MRSA of the spine and chronic narcotic dependence (on fentanyl patches) presented to the ED at [facility] via EMS due to dysuria and pressure ulcer of the left upper back which is chronic but developing a left lower back and sacral ulcer. Further work-up showed MRSA bacteremia, likely source his sacral ulcer.  Pt was also found to have LLE gangrene, CT arteriogram showed severe PVD, and was subsequently transferred to [facility] for AKA by Dr Lajoyce Corners. General surgery was also consulted for his multiple ulcers that may need debridement. Pt admitted for further management.  Patient noted to be talkative.. Today, denies any new complaints. For surgery today  Assessment/Plan: Principal Problem:   Atherosclerosis of native artery of left lower extremity with gangrene [facility]) Active Problems:   Left buttock abscess   Stage III pressure ulcer of buttock (HCC)   MRSA bacteremia   Antibiotic-associated diarrhea   Proteus infection   Acute lower UTI   Pressure ulcer of left upper back, stage 3 (HCC)   Hypokalemia   H/O degenerative disc disease   Chronic narcotic dependence (HCC)   Disuse atrophy of muscle   Tobacco abuse counseling   Right middle lobe pulmonary nodule   Chronic diastolic CHF (congestive heart failure) (HCC)   Non-healing ulcer of groin with fat layer exposed (HCC)   Gangrene of left foot (HCC)   Severe protein-calorie malnutrition (HCC)  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 2 weeks Monitor closely  Multiple decubitus/pressure ulcer Left upper back, sacral, groin, left buttock Gen surg on board, for debridement 01/07/18 Wound care consulted, apprec recs Continue IV AB  Anemia of chronic disease/malnutrition May worsen due to recent surgery Daily CBC  Proteus UTI Completed treatment with Keflex  Chronic diastolic HF Stable Monitor  Disuse atrophy of muscles PT/OT  Malnutrition Dietician on board  Chronic pain syndrome/narcotic dependence Continue pain management with home fentanyl patch   Tobacco abuse Advised to quit Nicotine patch    Code Status: Full   Family Communication: None at bedside  Disposition Plan: Likely SNF, TBD by PT/OT   Consultants:  Orthopedics  Gen surgery  IR for access placement   Procedures:  AKA on 01/06/18  TEE  Antimicrobials:  IV Vancomycin  IV Cefazolin  DVT prophylaxis: Heparin [place]   Objective: Vitals:   01/06/18 2028 01/07/18 0200 01/07/18 0428 01/07/18 1126  BP: 105/76 117/72 94/82   Pulse: 82 84 85   Resp: 18 18 18    Temp: 98.4 F (36.9 C) 98.7 F (37.1 C) 98.8 F (37.1 C)   TempSrc: Oral Oral Oral   SpO2: 100% 100% 100%   Weight:    60.9 kg  Height:    6\' 1"  (1.854 m)  Intake/Output Summary (Last 24 hours) at 01/07/2018 1227 Last data filed at 01/07/2018 1100 Gross per 24 hour  Intake 5272.08 ml  Output 2935 ml  Net 2337.08 ml   Filed Weights   01/05/18 1305 01/06/18 0418 01/07/18 1126  Weight: 73.6 kg 60.9 kg 60.9 kg    Exam:   General: NAD, cachetic   Cardiovascular: S1, S2 present   Respiratory: Diminished BS  bilaterally, RIJ  Abdomen: Soft, NT, ND, BS present  Musculoskeletal: Left AKA, No pedal edema on R  Skin: Multiple ulcers as noted above  Psychiatry: Normal mood   Data Reviewed: CBC: Recent Labs  Lab 01/05/18 1506 01/06/18 0337 01/07/18 0325  WBC 14.7* 13.4* 13.6*  NEUTROABS  --   --  9.5*  HGB 10.4* 9.9* 8.5*  HCT 33.7* 31.7* 27.6*  MCV 90.6 90.1 92.0  PLT 277 276 319   Basic Metabolic Panel: Recent Labs  Lab 01/05/18 1506 01/06/18 0337 01/07/18 0325  NA  --  141 140  K  --  3.5 3.9  CL  --  113* 111  CO2  --  24 25  GLUCOSE  --  72 76  BUN  --  <5* <5*  CREATININE 0.53* 0.54* 0.50*  CALCIUM  --  6.8* 6.7*  MG  --   --  2.3  PHOS  --  2.3*  --    GFR: Estimated Creatinine Clearance: 89.9 mL/min (A) (by C-G formula based on SCr of 0.5 mg/dL (L)). Liver Function Tests: No results for input(s): AST, ALT, ALKPHOS, BILITOT, PROT, ALBUMIN in the last 168 hours. No results for input(s): LIPASE, AMYLASE in the last 168 hours. No results for input(s): AMMONIA in the last 168 hours. Coagulation Profile: No results for input(s): INR, PROTIME in the last 168 hours. Cardiac Enzymes: No results for input(s): CKTOTAL, CKMB, CKMBINDEX, TROPONINI in the last 168 hours. BNP (last 3 results) No results for input(s): PROBNP in the last 8760 hours. HbA1C: No results for input(s): HGBA1C in the last 72 hours. CBG: Recent Labs  Lab 01/06/18 0617 01/06/18 0637 01/07/18 0831  GLUCAP 56* 154* 54*   Lipid Profile: No results for input(s): CHOL, HDL, LDLCALC, TRIG, CHOLHDL, LDLDIRECT in the last 72 hours. Thyroid Function Tests: No results for input(s): TSH, T4TOTAL, FREET4, T3FREE, THYROIDAB in the last 72 hours. Anemia Panel: No results for input(s): VITAMINB12, FOLATE, FERRITIN, TIBC, IRON, RETICCTPCT in the last 72 hours. Urine analysis: No results found for: COLORURINE, APPEARANCEUR, LABSPEC, PHURINE, GLUCOSEU, HGBUR, BILIRUBINUR, KETONESUR, PROTEINUR, UROBILINOGEN,  NITRITE, LEUKOCYTESUR Sepsis Labs: @LABRCNTIP (procalcitonin:4,lacticidven:4)  ) Recent Results (from the past 240 hour(s))  Culture, blood (routine x 2)     Status: None (Preliminary result)   Collection Time: 01/06/18 12:26 PM  Result Value Ref Range Status   Specimen Description BLOOD LEFT ANTECUBITAL  Final   Special Requests   Final    BOTTLES DRAWN AEROBIC AND ANAEROBIC Blood Culture adequate volume   Culture   Final    NO GROWTH < 24 HOURS Performed at [facility], [address] [place], [place] [address]    Report Status PENDING  Incomplete  Culture, blood (routine x 2)     Status: None (Preliminary result)   Collection Time: 01/06/18 12:26 PM  Result Value Ref Range Status   Specimen Description BLOOD RIGHT ANTECUBITAL  Final   Special Requests   Final    BOTTLES DRAWN AEROBIC AND ANAEROBIC Blood Culture adequate volume   Culture   Final  NO GROWTH < 24 HOURS Performed at [facility], [address] [place], [place] [address]    Report Status PENDING  Incomplete      Studies: Ir Fluoro Guide Cv Line Right  Result Date: 01/07/2018 INDICATION: 57 year old male with a history of above knee amputation EXAM: ULTRASOUND-GUIDED TUNNELED CENTRAL CATHETER MEDICATIONS: None ANESTHESIA/SEDATION: None FLUOROSCOPY TIME:  Fluoroscopy Time: 0 minutes 6 seconds COMPLICATIONS: None PROCEDURE: After written informed consent was obtained, patient was placed in the supine position on angiographic table. Patency of the right internal jugular vein was confirmed with ultrasound with image documentation. Patient was prepped and draped in the usual sterile fashion including the right neck and right superior chest. Using ultrasound guidance, the skin and subcutaneous tissues overlying the right internal jugular vein were generously infiltrated with 1% lidocaine without epinephrine. Using ultrasound guidance, the right internal jugular vein was punctured with a micropuncture needle,  and an 018 wire was advanced into the right heart confirming venous access. A small stab incision was made with an 11 blade scalpel. Peel-away sheath was placed over the wire, and then the wire was removed, marking the wire for estimation of internal catheter length. The chest wall was then generously infiltrated with 1% lidocaine for local anesthesia along the tissue tract. Small stab incision was made with 11 blade scalpel, and then the catheter was back tunneled to the puncture site at the right internal jugular vein. Catheter was pulled through the tract, with the catheter amputated at 23.5 cm cm. Catheter was advanced through the peel-away sheath, and the peel-away sheath was removed. Final image was stored. The catheter was anchored to the chest wall with 2 retention sutures, and Derma bond was used to seal the right internal jugular vein incision site and at the right chest wall. Patient tolerated the procedure well and remained hemodynamically stable throughout. No complications were encountered and no significant blood loss was encountered. IMPRESSION: Status post right IJ tunneled cuffed catheter placement. Catheter ready for use. Signed, Yvone NeuJaime S. Reyne DumasWagner, DO, RPVI [facility] Electronically Signed   By: Gilmer MorJaime  Wagner D.O.   On: 01/07/2018 11:42   Ir [place]as Guide Vasc Access Right  Result Date: 01/07/2018 INDICATION: 57 year old male with a history of above knee amputation EXAM: ULTRASOUND-GUIDED TUNNELED CENTRAL CATHETER MEDICATIONS: None ANESTHESIA/SEDATION: None FLUOROSCOPY TIME:  Fluoroscopy Time: 0 minutes 6 seconds COMPLICATIONS: None PROCEDURE: After written informed consent was obtained, patient was placed in the supine position on angiographic table. Patency of the right internal jugular vein was confirmed with ultrasound with image documentation. Patient was prepped and draped in the usual sterile fashion including the right neck and right  superior chest. Using ultrasound guidance, the skin and subcutaneous tissues overlying the right internal jugular vein were generously infiltrated with 1% lidocaine without epinephrine. Using ultrasound guidance, the right internal jugular vein was punctured with a micropuncture needle, and an 018 wire was advanced into the right heart confirming venous access. A small stab incision was made with an 11 blade scalpel. Peel-away sheath was placed over the wire, and then the wire was removed, marking the wire for estimation of internal catheter length. The chest wall was then generously infiltrated with 1% lidocaine for local anesthesia along the tissue tract. Small stab incision was made with 11 blade scalpel, and then the catheter was back tunneled to the puncture site at the right internal jugular vein. Catheter was pulled through the tract, with the catheter amputated at 23.5  cm cm. Catheter was advanced through the peel-away sheath, and the peel-away sheath was removed. Final image was stored. The catheter was anchored to the chest wall with 2 retention sutures, and Derma bond was used to seal the right internal jugular vein incision site and at the right chest wall. Patient tolerated the procedure well and remained hemodynamically stable throughout. No complications were encountered and no significant blood loss was encountered. IMPRESSION: Status post right IJ tunneled cuffed catheter placement. Catheter ready for use. Signed, Yvone Neu. Reyne Dumas, RPVI [facility] Electronically Signed   By: Gilmer Mor D.O.   On: 01/07/2018 11:42    Scheduled Meds: . [MAR Hold] aspirin EC  81 mg Oral Daily  . [MAR Hold] atorvastatin  40 mg Oral q1800  . chlorhexidine      . [MAR Hold] docusate sodium  100 mg Oral BID  . [MAR Hold] fentaNYL  75 mcg Transdermal Q72H  . [MAR Hold] heparin  5,000 Units Subcutaneous Q8H  . lidocaine      . [MAR Hold] midodrine   5 mg Oral TID WC    Continuous Infusions: . [MAR Hold] sodium chloride Stopped (01/05/18 1726)  . [MAR Hold] sodium chloride Stopped (01/06/18 0750)  . sodium chloride 10 mL/hr at 01/06/18 1846  . dextrose 5 % and 0.45% NaCl 75 mL/hr at 01/07/18 1056  . lactated ringers 10 mL/hr at 01/07/18 1157  . [MAR Hold] methocarbamol (ROBAXIN) IV    . [MAR Hold] potassium chloride 10 mEq (01/07/18 1056)  . [MAR Hold] vancomycin 1,000 mg (01/06/18 2215)     LOS: 2 days     Briant Cedar, MD [facility]   If 7PM-7AM, please contact night-coverage [email or web address] Password [facility] 01/07/2018, 12:27 PM

## 2018-01-07 NOTE — Progress Notes (Signed)
OT Cancellation Note  Patient Details Name: Kevin SimsDavid Whitney MRN: [id number] DOB: [birth year]   Cancelled Treatment:    Reason Eval/Treat Not Completed: Patient at procedure or test/ unavailable. Pt leaving unit for procedure on my arrival. Will check back as able.   Kevin Sectionharity A Taeden Geller, MS OTR/L  Pager: [phone]   Kevin Whitney 01/07/2018, 11:09 AM

## 2018-01-07 NOTE — Social Work (Signed)
CSW following for disposition, aware current recommendations are for CIR, pt currently off the floor at procedure.  Doy HutchingIsabel H Benicio Manna, LCSWA [facility] Clinical Social Work [phone]

## 2018-01-07 NOTE — Transfer of Care (Signed)
Immediate Anesthesia Transfer of Care Note  Patient: Kevin SimsDavid Fails  Procedure(s) Performed: DEBRIDMENT OF DECUBITUS ULCER (N/A Back)  Patient Location: PACU  Anesthesia Type:General  Level of Consciousness: awake, alert  and oriented  Airway & Oxygen Therapy: Patient Spontanous Breathing and Patient connected to nasal cannula oxygen  Post-op Assessment: Report given to RN, Post -op Vital signs reviewed and stable and Patient moving all extremities X 4  Post vital signs: Reviewed and stable  Last Vitals:  Vitals Value Taken Time  BP 110/76 01/07/2018  1:49 PM  Temp    Pulse 109 01/07/2018  1:53 PM  Resp 27 01/07/2018  1:54 PM  SpO2 100 % 01/07/2018  1:53 PM  Vitals shown include unvalidated device data.  Last Pain:  Vitals:   01/07/18 0635  TempSrc:   PainSc: 4          Complications: No apparent anesthesia complications

## 2018-01-07 NOTE — Op Note (Signed)
Preop diagnosis: Infected sacral decubitus ulcer Postop diagnosis: Infected sacral decubitus ulcer (7 x 6 cm x 1 cm deep) Procedure performed: Sharp debridement and irrigation of infected sacral decubitus ulcer Surgeon:Jayceion Lisenby K Janzen Sacks  Anesthesia: General endotracheal Indications: This is a 57 year old male with multiple medical issues who is status post left AKA for gangrene.  He also has an infected sacral decubitus ulcer with a large amount of purulent drainage.  He presents now for debridement.  Description of procedure: The patient is brought to the operating room and placed in supine position on his hospital bed.  After an adequate level of anesthesia was obtained, he was moved to a prone position on chest rolls on the OR table.  The dressing was removed.  His sacral and gluteal regions were prepped with Betadine and draped in sterile fashion.  Timeout was taken to ensure the proper patient and proper procedure.  Patient has a 3 cm diameter wound.  There is a large amount of purulent drainage coming from this wound.  It seems to tunnel towards the patient's left side.  We cultured the drainage and sent this to microbiology.  I used a knife to create a round incision over the undermined abscess cavity.  Cautery was used to excise the skin and subcutaneous tissue to expose the entire abscess cavity.  We had to enlarge our wound slightly to cover all of the undermined areas.  Once we had excised all of the infected necrotic skin we used cautery for hemostasis.  We then irrigated the wound thoroughly and inspected for hemostasis.  The wound is only about a centimeter or less deep and measures 7 x 6 cm.  There seems to be a healthy layer of soft tissue still over the sacrum.  All of the nonviable tissue has been removed.  He should not need any further surgical debridement.  A normal saline wet-to-dry dressing was placed to fill the wound.  We used an ABD dressing over the moist gauze.  The patient was moved  back to a supine position.  He was extubated and brought to the recovery room in stable condition.  All sponge, instrument, and needle counts are correct.  Wilmon ArmsMatthew K. Corliss Skainssuei, MD, [facility]  General/ Trauma Surgery Beeper [phone]  01/07/2018 1:44 PM

## 2018-01-07 NOTE — Progress Notes (Signed)
Pt had a CBG of 54 this morning. D50 pushed and MD notified. Pt is npo for a procedure

## 2018-01-07 NOTE — Consult Note (Signed)
Physical Medicine and Rehabilitation Consult   Reason for Consult: L-AKA and sacral infection with functional deficits.  Referring Physician: Dr. Sharolyn Whitney   HPI: Kevin Whitney is a 57 y.o. male with history of DDD and h/o MRSA of spine with paraplegia,  chronic pain who was admitted via [facility] for management of  MRSA bacteremia felt to be due to sacral ulcer with abscess, Proteus UTI and severe PVD LLE with dry gangrene. History taken from chart review and patient.  He was evaluated by Dr. Lajoyce Whitney and underwent L-AKA on 08/25. Dr. Carolynne Whitney consulted for input on sacral decub with ongoing purulent drainage and recommended continuing IV Vanc. Plans for I and D later today. PT evaluation done and CIR recommended due to functional deficits. Hospital course further complicated by pain. Patient plans to return home with parents (in 104s) and had an aide 2/week before.   Review of Systems  Constitutional: Positive for malaise/fatigue.  Gastrointestinal: Positive for constipation.  Musculoskeletal: Positive for back pain, joint pain and myalgias.  Neurological: Positive for sensory change and focal weakness.  All other systems reviewed and are negative.   Past Medical History:  Diagnosis Date  . Broken legs   . Chronic pain   . MRSA (methicillin resistant staph aureus) culture positive    2003   . Staph infection     Past Surgical History:  Procedure Laterality Date  . AMPUTATION Left 01/06/2018   Procedure: AMPUTATION ABOVE KNEE;  Surgeon: Kevin Mustard, MD;  Location: [facility] OR;  Service: Orthopedics;  Laterality: Left;  . BACK SURGERY    . FEMUR SURGERY     rt rod  . surgery every 3 wks for mrsa    . WRIST SURGERY     bilaterally    Family History  Problem Relation Age of Onset  . Arthritis Father    Social History:  reports that he has been smoking cigarettes. He has been smoking about 0.75 packs per day. He does not have any smokeless tobacco history on file. He reports that he does  not drink alcohol or use drugs.   Allergies: No Known Allergies   Medications Prior to Admission  Medication Sig Dispense Refill  . cyclobenzaprine (FLEXERIL) 10 MG tablet Take 10 mg by mouth 3 (three) times daily as needed for muscle spasms.    . diazepam (VALIUM) 10 MG tablet Take 10 mg by mouth every 12 (twelve) hours as needed for anxiety.     . fentaNYL (DURAGESIC - DOSED MCG/HR) 75 MCG/HR Place 75 mcg onto the skin every other day.     . liver oil-zinc oxide (DESITIN) 40 % ointment Apply 1 application topically as needed for irritation.    . Multiple Vitamin (MULTIVITAMIN WITH MINERALS) TABS tablet Take 1 tablet by mouth daily.      Home: Home Living Family/patient expects to be discharged to:: Private residence Living Arrangements: Parent, Other relatives Available Help at Discharge: Family, Available PRN/intermittently(If not 24 hours, then near 24 hours) Type of Home: House Home Access: Level entry(at back entrance) Home Layout: Multi-level(sunken den and other architectural barriers) Home Equipment: Wheelchair - manual(fabricated trapeze over his bed; sliding board)  Functional History: Prior Function Level of Independence: Needs assistance Gait / Transfers Assistance Needed: transfers OOB to chair with trapeze; uses sliding board as well; It is apparent he hasn't walked in quite some time; pt's father (who is Kevin Whitney) provides minguard assist to ensure safety with transfers ADL's / Homemaking Assistance Needed: Assist  prn; Unable to get details of ADLs Comments: Kevin Whitney is brimming with stories about how he manages at home, is often tangential and at times unclear; will need ot get more details of ADLs, etc Functional Status:  Mobility: Bed Mobility Overal bed mobility: Needs Assistance Bed Mobility: Rolling, Sidelying to Sit, Sit to Supine Rolling: Min guard(with rail) Sidelying to sit: Mod assist Sit to supine: Min assist General bed mobility comments: Good use of  rails to roll; light mod assist to get sidelying to sit; cues for technqiue; min assist to help RLE back into bed Transfers Overall transfer level: Needs assistance Transfers: Lateral/Scoot Transfers  Lateral/Scoot Transfers: Min assist General transfer comment: Simulated lateral scoot transfer along EOB to scoot towards HOB; noted good use of UEs to support      ADL:    Cognition: Cognition Overall Cognitive Status: Difficult to assess Orientation Level: Oriented X4 Cognition Arousal/Alertness: Awake/alert Behavior During Therapy: WFL for tasks assessed/performed, Impulsive Overall Cognitive Status: Difficult to assess General Comments: Kevin Whitney talks a lot, and is enthusiastic about sharing is stories; Often tangential; Needs rediraection to questions at hand; difficult to get questions asked, even simple orientation questions Difficult to assess due to: (extremely loquacious)   Blood pressure 94/82, pulse 85, temperature 98.8 F (37.1 C), temperature source Oral, resp. rate 18, weight 60.9 kg, SpO2 100 %. Physical Exam  Vitals reviewed. Constitutional: He is oriented to person, place, and time. He appears well-developed.  Frail and cachectic  HENT:  Head: Normocephalic and atraumatic.  Eyes: EOM are normal. Right eye exhibits no discharge. Left eye exhibits no discharge.  Neck: Normal range of motion. Neck supple.  Cardiovascular: Normal rate and regular rhythm.  Respiratory: Effort normal and breath sounds normal.  GI: Soft. Bowel sounds are normal.  Musculoskeletal:  Left stump tenderness  Neurological: He is alert and oriented to person, place, and time.  Motor: B/l UE 5/5 proximal to distal RLE: HF, KE 2+/5, ADF 3/5 LLE: HF 2/5 (pain inhibition)  Skin:  +VAC to left stump Sacral ulcer not examined  Psychiatric: He has a normal mood and affect. His behavior is normal.    Results for orders placed or performed during the hospital encounter of 01/05/18 (from the  past 24 hour(s))  Magnesium     Status: None   Collection Time: 01/07/18  3:25 AM  Result Value Ref Range   Magnesium 2.3 1.7 - 2.4 mg/dL  CBC with Differential/Platelet     Status: Abnormal   Collection Time: 01/07/18  3:25 AM  Result Value Ref Range   WBC 13.6 (H) 4.0 - 10.5 K/uL   RBC 3.00 (L) 4.22 - 5.81 MIL/uL   Hemoglobin 8.5 (L) 13.0 - 17.0 g/dL   HCT 16.127.6 (L) 09.639.0 - 04.552.0 %   MCV 92.0 78.0 - 100.0 fL   MCH 28.3 26.0 - 34.0 pg   MCHC 30.8 30.0 - 36.0 g/dL   RDW 40.915.9 (H) 81.111.5 - 91.415.5 %   Platelets 319 150 - 400 K/uL   Neutrophils Relative % 69 %   Neutro Abs 9.5 (H) 1.7 - 7.7 K/uL   Lymphocytes Relative 14 %   Lymphs Abs 1.9 0.7 - 4.0 K/uL   Monocytes Relative 6 %   Monocytes Absolute 0.8 0.1 - 1.0 K/uL   Eosinophils Relative 9 %   Eosinophils Absolute 1.2 (H) 0.0 - 0.7 K/uL   Basophils Relative 1 %   Basophils Absolute 0.1 0.0 - 0.1 K/uL   Immature Granulocytes  1 %   Abs Immature Granulocytes 0.1 0.0 - 0.1 K/uL  Basic metabolic panel     Status: Abnormal   Collection Time: 01/07/18  3:25 AM  Result Value Ref Range   Sodium 140 135 - 145 mmol/L   Potassium 3.9 3.5 - 5.1 mmol/L   Chloride 111 98 - 111 mmol/L   CO2 25 22 - 32 mmol/L   Glucose, Bld 76 70 - 99 mg/dL   BUN <5 (L) 6 - 20 mg/dL   Creatinine, Ser 4.78 (L) 0.61 - 1.24 mg/dL   Calcium 6.7 (L) 8.9 - 10.3 mg/dL   GFR calc non Af Amer >60 >60 mL/min   GFR calc Af Amer >60 >60 mL/min   Anion gap 4 (L) 5 - 15   Dg Chest Port 1 View  Result Date: 01/05/2018 CLINICAL DATA:  Line placement, preoperative for left above-the-knee amputation EXAM: PORTABLE CHEST 1 VIEW COMPARISON:  01/01/2018 chest radiograph. FINDINGS: Right subclavian central venous catheter terminates in the middle third of the SVC. Spinal fusion hardware overlies the left lower thoracic spine. Stable cardiomediastinal silhouette with normal heart size. No pneumothorax. No pleural effusion. Nodular 1 cm opacity overlying peripheral mid to lower right  lung, stable since 01/01/2018 radiograph, new since 03/09/2016 chest CT. No pulmonary edema. Stable mild scarring versus atelectasis at the left greater than right lung bases. IMPRESSION: 1. Nodular 1 cm opacity overlying the peripheral mid to lower right lung, new since 2017 chest CT. Cannot exclude pulmonary nodule. Recommend further evaluation chest CT. 2. Stable mild scarring at the left lung base. 3. Well-positioned right subclavian central venous catheter. Electronically Signed   By: Delbert Phenix M.D.   On: 01/05/2018 16:52    Assessment/Plan: Diagnosis: Left AKA with sacral ulcer Labs independently reviewed.  Records reviewed and summated above. Clean amputation daily with soap and water Monitor incision site for signs of infection or impending skin breakdown. Staples to remain in place for 3-4 weeks Stump shrinker, for edema control  Scar mobilization massaging to prevent soft tissue adherence Stump protector during therapies Prevent flexion contractures by implementing the following:   Encourage prone lying for 20-30 mins per day BID to avoid hip flexion  Contractures if medically appropriate;  Avoid prolonged sitting Post surgical pain control with oral medication Phantom limb pain control with physical modalities including desensitization techniques (gentle self massage to the residual stump,hot packs if sensation intact, [place]) and mirror therapy, TENS. If ineffective, consider pharmacological treatment for neuropathic pain (e.g gabapentin, pregabalin, amytriptalyine, duloxetine).  Avoid injury to contralateral side   1. Does the need for close, 24 hr/day medical supervision in concert with the patient's rehab needs make it unreasonable for this patient to be served in a less intensive setting? Potentially  2. Co-Morbidities requiring supervision/potential complications: sacral ulcer (IV abx), DDD and h/o MRSA of spine with paraplegia, acute on chronic pain (Biofeedback training with  therapies to help reduce reliance on opiate pain medications, particularly fentanyl, monitor pain control during therapies, and sedation at rest and titrate to maximum efficacy to ensure participation and gains in therapies), leukocytosis (cont to monitor for signs and symptoms of infection, further workup if indicated), ABLA (transfuse if necessary to ensure appropriate perfusion for increased activity tolerance) 3. Due to bladder management, bowel management, safety, skin/wound care, disease management, pain management and patient education, does the patient require 24 hr/day rehab nursing? Yes 4. Does the patient require coordinated care of a physician, rehab nurse, PT (1-2 hrs/day, 5 days/week)  and OT (1-2 hrs/day, 5 days/week) to address physical and functional deficits in the context of the above medical diagnosis(es)? Yes Addressing deficits in the following areas: balance, endurance, strength, transferring, bathing, dressing, toileting and psychosocial support 5. Can the patient actively participate in an intensive therapy program of at least 3 hrs of therapy per day at least 5 days per week? No 6. The potential for patient to make measurable gains while on inpatient rehab is good 7. Anticipated functional outcomes upon discharge from inpatient rehab are Mod I at wheelchair level  with PT, Mod I at wheelchiar level with OT, n/a with SLP. 8. Estimated rehab length of stay to reach the above functional goals is: 4-7 days. 9. Anticipated D/C setting: Home 10. Anticipated post D/C treatments: HH therapy and Home excercise program 11. Overall Rehab/Functional Prognosis: good  RECOMMENDATIONS: This patient's condition is appropriate for continued rehabilitative care in the following setting: Patient with limited activity toleranace with chronic weakness. He will likely not require CIR for wheelchair training and will not be able to tolerate 3 hours of therapy aside from that.  He also only has  supervision at discharge.  Recommend SNF at this time with outpatient PM&R follow up. Patient has agreed to participate in recommended program. Potentially Note that insurance prior authorization may be required for reimbursement for recommended care.  Comment: Rehab Admissions Coordinator to follow up.   I have personally performed a face to face diagnostic evaluation, including, but not limited to relevant history and physical exam findings, of this patient and developed relevant assessment and plan.  Additionally, I have reviewed and concur with the physician assistant's documentation above.   Maryla Morrow, MD, ABPMR Jacquelynn Cree, PA-C 01/07/2018

## 2018-01-07 NOTE — Anesthesia Procedure Notes (Signed)
Procedure Name: Intubation Date/Time: 01/07/2018 1:08 PM Performed by: Neldon Newport, CRNA Pre-anesthesia Checklist: Timeout performed, Patient being monitored, Suction available, Emergency Drugs available and Patient identified Patient Re-evaluated:Patient Re-evaluated prior to induction Oxygen Delivery Method: Circle system utilized Preoxygenation: Pre-oxygenation with 100% oxygen Induction Type: IV induction Ventilation: Mask ventilation without difficulty Laryngoscope Size: Mac and 3 Grade View: Grade I Tube type: Oral Tube size: 7.5 mm Number of attempts: 1 Placement Confirmation: breath sounds checked- equal and bilateral,  positive ETCO2 and ETT inserted through vocal cords under direct vision Secured at: 22 cm Tube secured with: Tape Dental Injury: Teeth and Oropharynx as per pre-operative assessment

## 2018-01-07 NOTE — Procedures (Signed)
Interventional Radiology Procedure Note  Procedure: Placement of a right IJ approach double lumen tunneled cuffed catheter.   Tip is positioned at the superior cavoatrial junction and catheter is ready for immediate use.  Complications: None Recommendations:  - Ok to use - Do not submerge - Routine line care   Signed,  Kallon Caylor S. Terence Bart, DO    

## 2018-01-07 NOTE — Anesthesia Preprocedure Evaluation (Signed)
Anesthesia Evaluation  Patient identified by MRN, date of birth, ID band Patient awake    Reviewed: Allergy & Precautions, H&P , NPO status , Patient's Chart, lab work & pertinent test results  Airway Mallampati: II   Neck ROM: full    Dental   Pulmonary Current Smoker,    breath sounds clear to auscultation       Cardiovascular + Peripheral Vascular Disease and +CHF   Rhythm:regular Rate:Normal     Neuro/Psych  Neuromuscular disease    GI/Hepatic   Endo/Other    Renal/GU      Musculoskeletal  (+) Arthritis ,   Abdominal   Peds  Hematology  (+) anemia ,   Anesthesia Other Findings   Reproductive/Obstetrics                             Anesthesia Physical Anesthesia Plan  ASA: III  Anesthesia Plan: General   Post-op Pain Management:    Induction: Intravenous  PONV Risk Score and Plan: 1 and Ondansetron, Midazolam, Dexamethasone and Treatment may vary due to age or medical condition  Airway Management Planned: Oral ETT  Additional Equipment:   Intra-op Plan:   Post-operative Plan: Extubation in OR  Informed Consent: I have reviewed the patients History and Physical, chart, labs and discussed the procedure including the risks, benefits and alternatives for the proposed anesthesia with the patient or authorized representative who has indicated his/her understanding and acceptance.     Plan Discussed with: CRNA, Anesthesiologist and Surgeon  Anesthesia Plan Comments:         Anesthesia Quick Evaluation

## 2018-01-07 NOTE — Progress Notes (Signed)
Physical Therapy Treatment Patient Details Name: Kevin SimsDavid Facer MRN: [id number] DOB: 30-Aug-1960 Today's Date: 01/07/2018    History of Present Illness Kevin Whitney is a 57 y.o. 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 Further work-up showed MRSA bacteremia, likely source his sacral ulcer.  Pt was also found to have LLE gangrene, CT arteriogram showed severe PVD, and was subsequently [facility] to [facility] for AKA by Dr Lajoyce Cornersuda.    PT Comments    Pt performed rolling, supine/sidelying exercises and sitting edge of bed.  Pt reports feeling mildly dizzy post surgery today and deferred OOB mobility,  He did sit edge of bed x 5 min before returning to supine.  Plan for lateral scoot transfers next session to improve function.  Continue to recommend CIR therapies to improve strength and function before returning home.    Follow Up Recommendations  CIR     Equipment Recommendations  Other (comment)(air overlay for bed at home)    Recommendations for Other Services       Precautions / Restrictions Precautions Precautions: Fall Precaution Comments: Contact Precautions Restrictions Weight Bearing Restrictions: Yes LLE Weight Bearing: Non weight bearing    Mobility  Bed Mobility Overal bed mobility: Needs Assistance Bed Mobility: Rolling;Sidelying to Sit;Sit to Supine Rolling: Min assist Sidelying to sit: Mod assist   Sit to supine: Min assist   General bed mobility comments: Good use of rails to roll; mod assist to get sidelying to sit pulling on PTAs hand for supprot ; cues for technqiue; min assist to help RLE back into bed.     Transfers Overall transfer level: (Pt refused secondary to feeling dizzy post surgical procedure.  )                  Ambulation/Gait                 Stairs             Wheelchair Mobility    Modified Rankin (Stroke Patients Only)       Balance     Sitting balance-Leahy Scale: Fair                                      Cognition Arousal/Alertness: Awake/alert Behavior During Therapy: WFL for tasks assessed/performed;Impulsive Overall Cognitive Status: Difficult to assess                                 General Comments: [name] talks a lot, and is enthusiastic about sharing is stories; Often tangential; Needs rediraection to questions at hand; difficult to get questions asked, even simple orientation questions      Exercises Amputee Exercises Hip Extension: Left;10 reps;Sidelying;AAROM Hip Flexion/Marching: AROM;Left;10 reps;Sidelying Straight Leg Raises: AROM;Left;10 reps;Supine    General Comments        Pertinent Vitals/Pain Pain Assessment: Faces Faces Pain Scale: Hurts even more Pain Location: LLE and wounds  Pain Descriptors / Indicators: Grimacing;Guarding Pain Intervention(s): Monitored during session;Repositioned    Home Living  Prior Function            PT Goals (current goals can now be found in the care plan section) Acute Rehab PT Goals Patient Stated Goal: Hopes to get home Potential to Achieve Goals: Good Progress towards PT goals: Progressing toward goals    Frequency    Min 3X/week      PT Plan Current plan remains appropriate    Co-evaluation              AM-PAC PT "6 Clicks" Daily Activity  Outcome Measure  Difficulty turning over in bed (including adjusting bedclothes, sheets and blankets)?: None Difficulty moving from lying on back to sitting on the side of the bed? : A Lot Difficulty sitting down on and standing up from a chair  with arms (e.g., wheelchair, bedside commode, etc,.)?: Unable Help needed moving to and from a bed to chair (including a wheelchair)?: Total Help needed walking in hospital room?: Total Help needed climbing 3-5 steps with a railing? : Total 6 Click Score: 10    End of Session Equipment Utilized During Treatment: Gait belt Activity Tolerance: Patient tolerated treatment well Patient left: in bed;with call bell/phone within reach;with family/visitor present Nurse Communication: Mobility status PT Visit Diagnosis: Muscle weakness (generalized) (M62.81);Other abnormalities of gait and mobility (R26.89);Other symptoms and signs involving the nervous system (R29.898);Pain Pain - Right/Left: Left Pain - part of body: Leg     Time: 1647-1710 PT Time Calculation (min) (ACUTE ONLY): 23 min  Charges:  $Therapeutic Exercise: 8-22 mins $Therapeutic Activity: 8-22 mins                     Joycelyn Rua, PTA pager [phone]    Florestine Avers 01/07/2018, 5:20 PM

## 2018-01-08 ENCOUNTER — Encounter (HOSPITAL_COMMUNITY): Payer: Self-pay | Admitting: Surgery

## 2018-01-08 LAB — BASIC METABOLIC PANEL
ANION GAP: 3 — AB (ref 5–15)
BUN: 5 mg/dL — ABNORMAL LOW (ref 6–20)
CO2: 27 mmol/L (ref 22–32)
Calcium: 7.2 mg/dL — ABNORMAL LOW (ref 8.9–10.3)
Chloride: 109 mmol/L (ref 98–111)
Creatinine, Ser: 0.61 mg/dL (ref 0.61–1.24)
GFR calc Af Amer: >60 mL/min (ref >60)
GFR calc non Af Amer: >60 mL/min (ref >60)
GLUCOSE: 115 mg/dL — AB (ref 70–99)
POTASSIUM: 4.2 mmol/L (ref 3.5–5.1)
Sodium: 139 mmol/L (ref 135–145)

## 2018-01-08 LAB — CBC WITH DIFFERENTIAL/PLATELET
Abs Immature Granulocytes: 0.1 10*3/uL (ref 0.0–0.1)
Basophils Absolute: 0 10*3/uL (ref 0.0–0.1)
Basophils Relative: 0 %
Eosinophils Absolute: 0 10*3/uL (ref 0.0–0.7)
Eosinophils Relative: 0 %
HEMATOCRIT: 26.6 % — AB (ref 39.0–52.0)
HEMOGLOBIN: 8.1 g/dL — AB (ref 13.0–17.0)
IMMATURE GRANULOCYTES: 1 %
LYMPHS ABS: 1.5 10*3/uL (ref 0.7–4.0)
LYMPHS PCT: 12 %
MCH: 28 pg (ref 26.0–34.0)
MCHC: 30.5 g/dL (ref 30.0–36.0)
MCV: 92 fL (ref 78.0–100.0)
MONOS PCT: 5 %
Monocytes Absolute: 0.6 10*3/uL (ref 0.1–1.0)
NEUTROS ABS: 10.6 10*3/uL — AB (ref 1.7–7.7)
NEUTROS PCT: 82 %
Platelets: 335 10*3/uL (ref 150–400)
RBC: 2.89 MIL/uL — ABNORMAL LOW (ref 4.22–5.81)
RDW: 15.7 % — ABNORMAL HIGH (ref 11.5–15.5)
WBC: 12.9 10*3/uL — ABNORMAL HIGH (ref 4.0–10.5)

## 2018-01-08 LAB — GLUCOSE, CAPILLARY: GLUCOSE-CAPILLARY: 111 mg/dL — AB (ref 70–99)

## 2018-01-08 MED ORDER — DEXTROSE-NACL 5-0.45 % IV SOLN
INTRAVENOUS | Status: DC
  Administered 2018-01-08 – 2018-01-16 (×15): via INTRAVENOUS

## 2018-01-08 NOTE — Progress Notes (Signed)
Nutrition Follow-up  DOCUMENTATION CODES:   Not applicable  INTERVENTION:   -Continue Ensure Enlive po TID, each supplement provides 350 kcal and 20 grams of protein -Continue MVI with minerals daily -Continue 30 ml Prostat BID, each supplement provides 100 kcals and 15 grams protein  NUTRITION DIAGNOSIS:   Increased nutrient needs related to wound healing as evidenced by estimated needs.  Ongoing  GOAL:   Patient will meet greater than or equal to 90% of their needs  Progressing  MONITOR:   PO intake, Supplement acceptance, Labs, Weight trends, Skin, I & O's  REASON FOR ASSESSMENT:   Consult Assessment of nutrition requirement/status, Diet education, Wound healing  ASSESSMENT:   Kevin Whitney is a 57 y.o. 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  8/25- s/p lt AKA, with wound vac, s/p central line for IV access; s/p CWOCN eval- unstageable rt heel, full thickness rt thigh/sacrum, satellite wounds on back superior to sacral wound 8/26- s/p I&D of sacral ulcer  Case discussed with RN prior to visit, who reports that pt is eating and taking supplements well. He is very gracious of care he receives and very compliant with treatment regimen.   Spoke with pt at bedside, who reports fair appetite. PTA he consumes 3 meals per day (he shares he consumes a lot of steamable vegetables and rice, but often adds diced chicken to it to make a complete meal). Pt reports he consumed 100% of his breakfast this morning and also consumed Ensure supplement. He tried Prostat supplement, but estimates he  only consumed about half of it because "the gingerale made it spill over". He is very open to any and all recommendations to assist him.   Pt denies any recent wt loss. He shares that he "dropped a lot of weight since my accident and MRSA", but weight has been stable for several years. No recent wt records available to assess. Pt with severe fat and muscle depletion, however, unable to identify malnutrition at this time. Albumin has a half-life of 21 days and is strongly affected by stress response and inflammatory process, therefore, do not expect to see an improvement in this lab value during acute hospitalization.When a patient presents with low albumin, it is likely skewed due to the acute inflammatory response.  Unless it is suspected that patient had poor PO intake or malnutrition prior to admission, then RD should not be consulted solely for low albumin. Note that low albumin is no longer used to diagnose malnutrition; [place] uses the new malnutrition guidelines published by the American Society for Parenteral and Enteral Nutrition (A.S.P.E.N.) and the Academy of Nutrition and Dietetics (AND).    Pt is at high risk for malnutrition due to increased needs for wound healing. Discussed importance of good meal and supplement intake to promote healing. Again, pt open to any and all supplement to assist him ("I'll do anything y'all to me to do to help me, because it's your job to take care of me and get me better").   Labs reviewed: CBGS: 54-111.   NUTRITION - FOCUSED PHYSICAL EXAM:    Most Recent Value  Orbital Region  Severe depletion  Upper Arm Region  Severe depletion  Thoracic and Lumbar Region  Mild depletion  Buccal Region  Severe depletion  Temple Region  Severe depletion  Clavicle Bone Region  Severe depletion  Clavicle and Acromion Bone Region  Severe depletion  Scapular Bone Region  Severe depletion  Dorsal Hand  Severe depletion  Patellar Region  Severe depletion  Anterior  Thigh Region  Severe depletion  Posterior Calf Region  Severe depletion  Edema (RD Assessment)  None  Hair  Reviewed  Eyes  Reviewed  Mouth  Reviewed  Skin  Reviewed  Nails  Reviewed       Diet Order:   Diet Order            Diet regular Room service appropriate? Yes; Fluid consistency: Thin  Diet effective now              EDUCATION NEEDS:   No education needs have been identified at this time  Skin:  Skin Assessment: Skin Integrity Issues: Skin Integrity Issues:: Wound VAC, Unstageable, Other (Comment) Unstageable: rt heel Wound Vac: lt AKA Other: full thickness rt thigh/sacrum, satellite wounds on back superior to sacrum  Last BM:  01/06/18  Height:   Ht Readings from Last 1 Encounters:  01/07/18 6\' 1"  (1.854 m)    Weight:   Wt Readings from Last 1 Encounters:  01/07/18 60.9 kg    Ideal Body Weight:  71.2 kg  BMI:  Body mass index is 17.72 kg/m.  Estimated Nutritional Needs:   Kcal:  1850-2050  Protein:  120-135 grams  Fluid:  >1.8 L    Lamoine Magallon A. Mayford KnifeWilliams, RD, LDN, CDE Pager: [phone] After hours Pager: [phone]

## 2018-01-08 NOTE — Plan of Care (Signed)
  Problem: Education: Goal: Knowledge of General Education information will improve Description: Including pain rating scale, medication(s)/side effects and non-pharmacologic comfort measures Outcome: Progressing   Problem: Clinical Measurements: Goal: Cardiovascular complication will be avoided Outcome: Progressing   Problem: Nutrition: Goal: Adequate nutrition will be maintained Outcome: Progressing   

## 2018-01-08 NOTE — Evaluation (Addendum)
Occupational Therapy Evaluation Patient Details Name: Kevin SimsDavid Whitney MRN: [id number] DOB: 23-May-1960 Today's Date: 01/08/2018    History of Present Illness Kevin Whitney is a 57 y.o. 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 Further work-up showed MRSA bacteremia, likely source his sacral ulcer.  Pt was also found to have LLE gangrene, CT arteriogram showed severe PVD, and was [facility] AKA by Dr Lajoyce Cornersuda.   Clinical Impression   PTA, pt reports living with his parents and requiring assistance for lateral scoot transfers to and from RW with transfer board. He has occasional assistance for ADL and does wash-ups instead of showers. Pt currently requiring mod assist for bed mobility to sit at EOB for ADL engagement. He requires mod assist for UB ADL and total assist for LB ADL. Pt would benefit from continued OT services while admitted to improve independence and safety with ADL and functional mobility. Recommend CIR level therapies post-acute D/C. OT will continue to follow while admitted.     Follow Up Recommendations  CIR;Supervision/Assistance - 24 hour    Equipment Recommendations  3 in 1 bedside commode    Recommendations for Other Services       Precautions / Restrictions Precautions Precautions: Fall Precaution Comments: Contact Precautions Restrictions Weight Bearing Restrictions: Yes LLE Weight Bearing: Non weight bearing      Mobility Bed Mobility Overal bed mobility: Needs Assistance Bed Mobility: Rolling;Sidelying to Sit;Sit to Supine Rolling: Min assist Sidelying to sit: Mod assist    Sit to supine: Mod assist   General bed mobility comments: Assist to manage trunk and BLE.  Transfers                 General transfer comment: Unable to progress OOB today.     Balance Overall balance assessment: Needs assistance Sitting-balance support: Feet supported;Bilateral upper extremity supported Sitting balance-Leahy Scale: Poor Sitting balance - Comments: Relies on BUE support.                                   ADL either performed or assessed with clinical judgement   ADL Overall ADL's : Needs assistance/impaired Eating/Feeding: Set up;Bed level   Grooming: Set up;Bed level   Upper Body Bathing: Moderate assistance;Sitting   Lower Body Bathing: Total assistance;Sitting/lateral leans   Upper Body Dressing : Moderate assistance;Sitting   Lower Body Dressing: Total assistance;Sit to/from stand                 General ADL Comments: Pt able to sit at EOB for approximately 10 minutes in preparation for ADL participation. However, pt requiring UE support.      Vision Patient Visual Report: No change from baseline Vision Assessment?: No apparent visual deficits     Perception     Praxis      Pertinent Vitals/Pain Pain Assessment: Faces Faces Pain Scale: Hurts even more Pain Location: LLE and wounds  Pain Descriptors / Indicators: Grimacing;Guarding Pain Intervention(s): Limited activity within patient's tolerance;Monitored during session;Repositioned     Hand Dominance     Extremity/Trunk Assessment Upper Extremity  Assessment Upper Extremity Assessment: Generalized weakness   Lower Extremity Assessment Lower Extremity Assessment: Defer to PT evaluation;LLE deficits/detail LLE Deficits / Details: AKA; educated on desensitization       Communication Communication Communication: No difficulties(tangential)   Cognition Arousal/Alertness: Awake/alert Behavior During Therapy: WFL for tasks  assessed/performed;Impulsive Overall Cognitive Status: Difficult to assess Area of Impairment: Attention;Safety/judgement;Awareness;Problem solving                   Current Attention Level: Selective     Safety/Judgement: Decreased awareness of safety;Decreased awareness of deficits Awareness: Emergent Problem Solving: Slow processing General Comments: Pt is eager to share stories and is often tangential. Required redirection throughout tasks as he is very easily distracted by internal thoughts.    General Comments  Pt reports he took a "pain holiday" not taking medications today to "see how his pain really is"    Exercises     Shoulder Instructions      Home Living Family/patient expects to be discharged to:: Private residence Living Arrangements: Parent Available Help at Discharge: Family;Available PRN/intermittently(near 24 hour assist) Type of Home: House Home Access: Level entry     Home Layout: Multi-level(sunken den and other architectural barriers)     Bathroom Shower/Tub: (does not use shower or tub; does wash-ups)   Bathroom Toilet: Standard     Home Equipment: Wheelchair - manual(sliding board; fabricated trapeze bar over bed)          Prior Functioning/Environment Level of Independence: Needs assistance  Gait / Transfers Assistance Needed: Pt has not been able to ambulate for some time. Uses trapeze bar, slide board, and assistance to transfer.  ADL's / Homemaking Assistance Needed: Assistance at times.            OT Problem List: Decreased strength;Decreased range of motion;Decreased activity tolerance;Impaired balance (sitting and/or standing);Decreased safety awareness;Decreased knowledge of use of DME or AE;Decreased knowledge of precautions;Pain      OT Treatment/Interventions: Self-care/ADL training;Therapeutic exercise;Splinting;Therapeutic activities;Patient/family education;Balance training;Energy conservation;DME and/or AE  instruction    OT Goals(Current goals can be found in the care plan section) Acute Rehab OT Goals Patient Stated Goal: Hopes to get home OT Goal Formulation: With patient Time For Goal Achievement: 01/22/18 Potential to Achieve Goals: Good ADL Goals Pt Will Perform Grooming: with set-up;sitting Pt Will Perform Upper Body Dressing: with modified independence;sitting Pt Will Perform Lower Body Dressing: with modified independence;sitting/lateral leans Pt Will Transfer to Toilet: with supervision;with transfer board;bedside commode Pt Will Perform Toileting - Clothing Manipulation and hygiene: with modified independence;sitting/lateral leans  OT Frequency: Min 2X/week   Barriers to D/C:            Co-evaluation              AM-PAC PT "6 Clicks" Daily Activity     Outcome Measure Help from another person eating meals?: None Help from another person taking care of personal grooming?: None Help from another person toileting, which includes using toliet, bedpan, or urinal?: A Lot Help from another person bathing (including washing, rinsing, drying)?: A Lot Help from another person to put on and taking off regular upper body clothing?: A Lot Help from another person to put on and taking off regular lower body clothing?: A Lot 6 Click Score: 16   End of Session Equipment Utilized During Treatment: Gait belt;Rolling walker Nurse Communication: Mobility status  Activity Tolerance: Patient tolerated treatment well Patient left: in bed;with call bell/phone within reach  OT Visit Diagnosis: Other abnormalities of  gait and mobility (R26.89);Pain Pain - Right/Left: Right Pain - part of body: Leg(residual limb)                Time: 1610-9604 OT Time Calculation (min): 35 min Charges:  OT General Charges $OT Visit: 1 Visit OT Evaluation $OT Eval Moderate Complexity: 1 Mod OT Treatments $Self Care/Home Management : 8-22 mins  Doristine Section, MS OTR/L  Pager:  [phone]   Pearlie Nies A Ronen Bromwell 01/08/2018, 5:01 PM

## 2018-01-08 NOTE — Progress Notes (Signed)
PROGRESS NOTE  Kevin SimsDavid Whitney WJX:[id number] DOB: 10-24-1960 DOA: 01/05/2018 PCP: [name], James, MD  HPI/Recap of past 24 hours: Kevin SimsDavid Whitney is a 57 y.o. 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 Further work-up showed MRSA bacteremia, likely source his sacral ulcer.  Pt was also found to have LLE gangrene, CT arteriogram showed severe PVD, and was subsequently [facility] by Dr Lajoyce Cornersuda. General surgery was also consulted for his multiple ulcers that may need debridement. Pt admitted for further management.  Today, denies any new complaints.  Assessment/Plan: Principal Problem:   Atherosclerosis of native artery of left lower extremity with gangrene [facility](HCC) Active Problems:   Left buttock abscess   Stage III pressure ulcer of buttock (HCC)   MRSA bacteremia   Antibiotic-associated diarrhea   Proteus infection   Acute lower UTI   Pressure ulcer of left upper back, stage 3 (HCC)   Hypokalemia   H/O degenerative disc disease   Chronic narcotic dependence (HCC)   Disuse atrophy of muscle   Tobacco abuse counseling   Right middle lobe pulmonary nodule   Chronic diastolic CHF (congestive heart failure) (HCC)   Non-healing ulcer of groin with fat layer exposed (HCC)   Gangrene of left foot (HCC)   Severe protein-calorie malnutrition (HCC)   Bacteremia   Pressure injury of skin of sacral region   DDD (degenerative disc disease), lumbosacral   Chronic pain syndrome   Leukocytosis   Acute blood loss anemia  Left foot gangrene 2/2 severe PVD S/P AKA with wound vac by Dr Lajoyce Cornersuda on 01/06/18 Arteriogram studies shows  complete occlusion of the popliteal fossa on the left Orthopedics on board, appreciate recs Pain management  Continue ASA, lipitor PT/OT  MRSA bacteremia Likely from decubitus ulcers Required IVF and pressors on admission at [facility] Currently afebrile with leukocytosis BC done in [facility] showed MRSA, repeat BC pending TTE and TEE done at [facility] are both unremarkable Spoke to ID, Dr Orvan Falconerampbell, pt will need 3 weeks of IV Vancomycin (no need to see pt as work up complete and pt currently stable. If any clinical deterioration, please consult)  Continue IV vancomycin, started on 8/23 Pt has a R IJ double lumen tunneled catheter recently placed by IR Monitor closely  Multiple decubitus/pressure ulcer S/P debridement on 01/07/18 Left upper back, sacral, groin, left buttock Await deep tissue culture, pending Gen surg on board, appreciate recs Wound care consulted, apprec recs Continue IV AB Continue IVF  Anemia of chronic disease/malnutrition Worsen due to recent surgery Type and screen in am Daily CBC  Proteus UTI Completed treatment with Keflex  Chronic diastolic HF Stable Monitor  Disuse atrophy of muscles PT/OT  Malnutrition Dietician on board  Chronic pain syndrome/narcotic dependence Continue pain management with home fentanyl patch   Tobacco abuse Advised to quit Nicotine patch    Code Status: Full   Family Communication: None at bedside  Disposition Plan: PT/OT recommending CIR, consult placed   Consultants:  Orthopedics  Gen surgery  IR for access placement   Procedures:  AKA on 01/06/18  Debridement on 01/07/18  TEE  Antimicrobials:  IV Vancomycin  DVT prophylaxis:  Heparin [place]   Objective: Vitals:   01/08/18 0152 01/08/18 0548 01/08/18 1120 01/08/18 1349  BP: (!) 99/54 110/76 (!) 91/58 (!) 84/73  Pulse: 94 93 98 72  Resp: 17  16 16   Temp: 97.7 F (36.5 C) 97.8 F (36.6 C) 98.2 F (36.8 C) 98.2 F (36.8 C)  TempSrc: Oral  Oral Oral Oral  SpO2: 98% 99% 95% 96%  Weight:      Height:        Intake/Output Summary (Last 24 hours) at 01/08/2018 1616 Last data filed at 01/08/2018 1458 Gross per 24 hour  Intake 1790 ml  Output 1750 ml  Net 40 ml   Filed Weights   01/05/18 1305 01/06/18 0418 01/07/18 1126  Weight: 73.6 kg 60.9 kg 60.9 kg    Exam:   General: NAD, cachetic   Cardiovascular: S1, S2 present   Respiratory: Diminished BS bilaterally, RIJ  Abdomen: Soft, NT, ND, BS present  Musculoskeletal: Left AKA, No pedal edema on R  Skin: Multiple ulcers as noted above  Psychiatry: Normal mood   Data Reviewed: CBC: Recent Labs  Lab 01/05/18 1506 01/06/18 0337 01/07/18 0325 01/08/18 0453  WBC 14.7* 13.4* 13.6* 12.9*  NEUTROABS  --   --  9.5* 10.6*  HGB 10.4* 9.9* 8.5* 8.1*  HCT 33.7* 31.7* 27.6* 26.6*  MCV 90.6 90.1 92.0 92.0  PLT 277 276 319 335   Basic Metabolic Panel: Recent Labs  Lab 01/05/18 1506 01/06/18 0337 01/07/18 0325 01/08/18 0453  NA  --  141 140 139  K  --  3.5 3.9 4.2  CL  --  113* 111 109  CO2  --  24 25 27   GLUCOSE  --  72 76 115*  BUN  --  <5* <5* 5*  CREATININE 0.53* 0.54* 0.50* 0.61  CALCIUM  --  6.8* 6.7* 7.2*  MG  --   --  2.3  --   PHOS  --  2.3*  --   --    GFR: Estimated Creatinine Clearance: 89.9 mL/min (by C-G formula based on SCr of 0.61 mg/dL). Liver Function Tests: No results for input(s): AST, ALT, ALKPHOS, BILITOT, PROT, ALBUMIN in the last 168 hours. No results for input(s): LIPASE, AMYLASE in the last 168 hours. No results for input(s): AMMONIA in the last 168 hours. Coagulation Profile: No results for input(s): INR, PROTIME in the last 168 hours. Cardiac Enzymes: No results for input(s): CKTOTAL, CKMB, CKMBINDEX, TROPONINI in the last 168 hours. BNP (last 3 results) No results for input(s): PROBNP in the last 8760 hours. HbA1C: No results for input(s): HGBA1C in the last 72 hours. CBG: Recent Labs  Lab 01/06/18 0617  01/06/18 0637 01/07/18 0831 01/08/18 0756  GLUCAP 56* 154* 54* 111*   Lipid Profile: No results for input(s): CHOL, HDL, LDLCALC, TRIG, CHOLHDL, LDLDIRECT in the last 72 hours. Thyroid Function Tests: No results for input(s): TSH, T4TOTAL, FREET4, T3FREE, THYROIDAB in the last 72 hours. Anemia Panel: No results for input(s): VITAMINB12, FOLATE, FERRITIN, TIBC, IRON, RETICCTPCT in the last 72 hours. Urine analysis: No results found for: COLORURINE, APPEARANCEUR, LABSPEC, PHURINE, GLUCOSEU, HGBUR, BILIRUBINUR, KETONESUR, PROTEINUR, UROBILINOGEN, NITRITE, LEUKOCYTESUR Sepsis Labs: @LABRCNTIP (procalcitonin:4,lacticidven:4)  ) Recent Results (from the past 240 hour(s))  Culture, blood (routine x 2)     Status: None (Preliminary result)   Collection Time: 01/06/18 12:26 PM  Result Value Ref Range Status   Specimen Description BLOOD LEFT ANTECUBITAL  Final   Special Requests   Final  BOTTLES DRAWN AEROBIC AND ANAEROBIC Blood Culture adequate volume   Culture   Final    NO GROWTH 2 DAYS Performed at [facility], [address], [place], [place] [address]    Report Status PENDING  Incomplete  Culture, blood (routine x 2)     Status: None (Preliminary result)   Collection Time: 01/06/18 12:26 PM  Result Value Ref Range Status   Specimen Description BLOOD RIGHT ANTECUBITAL  Final   Special Requests   Final    BOTTLES DRAWN AEROBIC AND ANAEROBIC Blood Culture adequate volume   Culture   Final    NO GROWTH 2 DAYS Performed at [facility], [address], [place], [place] [address]    Report Status PENDING  Incomplete  Aerobic/Anaerobic Culture (surgical/deep wound)     Status: None (Preliminary result)   Collection Time: 01/07/18  1:25 PM  Result Value Ref Range Status   Specimen Description ABSCESS  Final   Special Requests SACRAL DECUBITUS ULCER  Final   Gram Stain   Final    ABUNDANT WBC PRESENT, PREDOMINANTLY PMN FEW GRAM POSITIVE COCCI Performed at [facility], [address], [place], [place] [address]    Culture MODERATE STAPHYLOCOCCUS AUREUS  Final   Report Status PENDING  Incomplete      Studies: No results found.  Scheduled Meds: . aspirin EC  81 mg Oral Daily  . atorvastatin  40 mg Oral q1800  . docusate sodium  100 mg Oral BID  . feeding supplement (ENSURE ENLIVE)  237 mL Oral TID BM  . feeding supplement (PRO-STAT SUGAR FREE 64)  30 mL Oral BID  . fentaNYL  75 mcg Transdermal Q72H  . heparin  5,000 Units Subcutaneous Q8H  . midodrine  5 mg Oral TID WC  . multivitamin with minerals  1 tablet Oral Daily    Continuous Infusions: . sodium chloride 10 mL/hr at 01/06/18 1846  . dextrose 5 % and 0.45% NaCl Stopped (01/07/18 1957)  . lactated ringers Stopped (01/07/18 1958)  . methocarbamol (ROBAXIN) IV    . vancomycin 1,000 mg (01/08/18 1130)     LOS: 3 days     Briant Cedar, MD [facility]   If 7PM-7AM, please contact night-coverage [email or web address] Password [facility] 01/08/2018, 4:16 PM

## 2018-01-08 NOTE — Anesthesia Postprocedure Evaluation (Signed)
Anesthesia Post Note  Patient: Kevin SimsDavid Rotundo  Procedure(s) Performed: DEBRIDMENT OF DECUBITUS ULCER (N/A Back)     Patient location during evaluation: PACU Anesthesia Type: General Level of consciousness: awake and alert Pain management: pain level controlled Vital Signs Assessment: post-procedure vital signs reviewed and stable Respiratory status: spontaneous breathing, nonlabored ventilation, respiratory function stable and patient connected to nasal cannula oxygen Cardiovascular status: blood pressure returned to baseline and stable Postop Assessment: no apparent nausea or vomiting Anesthetic complications: no    Last Vitals:  Vitals:   01/08/18 0152 01/08/18 0548  BP: (!) 99/54 110/76  Pulse: 94 93  Resp: 17   Temp: 36.5 C 36.6 C  SpO2: 98% 99%    Last Pain:  Vitals:   01/08/18 0555  TempSrc:   PainSc: 9                  [name]

## 2018-01-08 NOTE — Progress Notes (Signed)
Pt resting, no complaints of pain or discomfort.  Took care of pt at 1600pm.  Tolerated po meds.  Bed in low position, call bell within reach.

## 2018-01-08 NOTE — Social Work (Signed)
CSW spoke with CIR admissions liaison, aware that pt is in between going home and going to SNF, have sent in clinicals for pt PASRR, will f/u with RN Case Manager regarding disposition.   Doy HutchingIsabel H Manna Gose, LCSWA [facility] Clinical Social Work [phone]

## 2018-01-08 NOTE — Progress Notes (Signed)
ANTIBIOTIC CONSULT NOTE   Pharmacy Consult for Vancomycin Indication: MRSA wound and bacteremia  No Known Allergies  Patient Measurements: Height: 6\' 1"  (185.4 cm) Weight: 134 lb 4.8 oz (60.9 kg) IBW/kg (Calculated) : 79.9 Adjusted Body Weight:    Vital Signs: Temp: 98.2 F (36.8 C) (08/27 1120) Temp Source: Oral (08/27 1120) BP: 91/58 (08/27 1120) Pulse Rate: 98 (08/27 1120) Intake/Output from previous day: 08/26 0701 - 08/27 0700 In: 2562 [P.O.:1240; I.V.:922; IV Piggyback:400] Out: 2295 [Urine:2295] Intake/Output from this shift: Total I/O In: 300 [P.O.:300] Out: 400 [Urine:400]  Labs: Recent Labs    01/06/18 0337 01/07/18 0325 01/08/18 0453  WBC 13.4* 13.6* 12.9*  HGB 9.9* 8.5* 8.1*  PLT 276 319 335  CREATININE 0.54* 0.50* 0.61   Estimated Creatinine Clearance: 89.9 mL/min (by C-G formula based on SCr of 0.61 mg/dL). No results for input(s): VANCOTROUGH, VANCOPEAK, VANCORANDOM, GENTTROUGH, GENTPEAK, GENTRANDOM, TOBRATROUGH, TOBRAPEAK, TOBRARND, AMIKACINPEAK, AMIKACINTROU, AMIKACIN in the last 72 hours.   Microbiology: Recent Results (from the past 720 hour(s))  Culture, blood (routine x 2)     Status: None (Preliminary result)   Collection Time: 01/06/18 12:26 PM  Result Value Ref Range Status   Specimen Description BLOOD LEFT ANTECUBITAL  Final   Special Requests   Final    BOTTLES DRAWN AEROBIC AND ANAEROBIC Blood Culture adequate volume   Culture   Final    NO GROWTH 2 DAYS Performed at [facility], [address] [place], [place] [address]    Report Status PENDING  Incomplete  Culture, blood (routine x 2)     Status: None (Preliminary result)   Collection Time: 01/06/18 12:26 PM  Result Value Ref Range Status   Specimen Description BLOOD RIGHT ANTECUBITAL  Final   Special Requests   Final    BOTTLES DRAWN AEROBIC AND ANAEROBIC Blood Culture adequate volume   Culture   Final    NO GROWTH 2 DAYS Performed at [facility], [address] [place], [place] [address]    Report Status PENDING  Incomplete  Aerobic/Anaerobic Culture (surgical/deep wound)     Status: None (Preliminary result)   Collection Time: 01/07/18  1:25 PM  Result Value Ref Range Status   Specimen Description ABSCESS  Final   Special Requests SACRAL DECUBITUS ULCER  Final   Gram Stain   Final    ABUNDANT WBC PRESENT, PREDOMINANTLY PMN FEW GRAM POSITIVE COCCI Performed at [facility], [address] [place], [place] [address]    Culture PENDING  Incomplete   Report Status PENDING  Incomplete    Medical History: Past Medical History:  Diagnosis Date  . Broken legs   . Chronic pain   . MRSA (methicillin resistant staph aureus) culture positive    2003   . 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 Spoke with [facility] pharmacist  ID: [facility]  [place] 8/20: MRSA bacteremia [facility] 8/20: SacraL wound: MRSA  Vanco (started at [place])>> Creatinine remains stable  [place] Vanco 1500mg /18>>Trough 11.6>>incr to 1g/12 hr 8/23 trough: 15.7: con't 1g IV q 12 hrs  Goal of Therapy:  Vancomycin trough level 15-20 mcg/ml  Plan:  Continue Vancomycin 1g IV q 12 hrs Will obtain repeat trough on 8/30 unless renal function changes.   Kevin Whitney, PharmD, [facility] Clinical Pharmacist Please see AMION for all Pharmacists' Contact Phone Numbers 01/08/2018, 12:54 PM

## 2018-01-08 NOTE — Progress Notes (Signed)
[facility] Progress Note  1 Day Post-Op  Subjective: CC: wound Patient with minimal pain in sacrum. Having back pain related to arthritis.   Objective: Vital signs in last 24 hours: Temp:  [97.3 F (36.3 C)-98.2 F (36.8 C)] 98.2 F (36.8 C) (08/27 1120) Pulse Rate:  [91-116] 98 (08/27 1120) Resp:  [16-38] 16 (08/27 1120) BP: (91-120)/(54-79) 91/58 (08/27 1120) SpO2:  [95 %-100 %] 95 % (08/27 1120) Last BM Date: 01/06/18  Intake/Output from previous day: 08/26 0701 - 08/27 0700 In: 2562 [P.O.:1240; I.V.:922; IV Piggyback:400] Out: 2295 [Urine:2295] Intake/Output this shift: Total I/O In: 300 [P.O.:300] Out: 400 [Urine:400]  PE: Gen:  Alert, NAD, pleasant Pulm:  Normal effort Abd: Soft, non-tender, non-distended GU:   Wound as above, clean with good granulation  Psych: A&Ox3   Lab Results:  Recent Labs    01/07/18 0325 01/08/18 0453  WBC 13.6* 12.9*  HGB 8.5* 8.1*  HCT 27.6* 26.6*  PLT 319 335   BMET Recent Labs    01/07/18 0325 01/08/18 0453  NA 140 139  K 3.9 4.2  CL 111 109  CO2 25 27  GLUCOSE 76 115*  BUN <5* 5*  CREATININE 0.50* 0.61  CALCIUM 6.7* 7.2*   PT/INR No results for input(s): LABPROT, INR in the last 72 hours. CMP     Component Value Date/Time   NA 139 01/08/2018 0453   K 4.2 01/08/2018 0453   CL 109 01/08/2018 0453   CO2 27 01/08/2018 0453   GLUCOSE 115 (H) 01/08/2018 0453   BUN 5 (L) 01/08/2018 0453   CREATININE 0.61 01/08/2018 0453   CALCIUM 7.2 (L) 01/08/2018 0453   PROT 5.2 (L) 02/09/2011 0627   ALBUMIN 2.2 (L) 02/09/2011 0627   AST 10 02/09/2011 0627   ALT <5 02/09/2011 0627   ALKPHOS 47 02/09/2011 0627   BILITOT 0.6 02/09/2011 0627   GFRNONAA >60 01/08/2018 0453   GFRAA >60 01/08/2018 0453   Lipase  No results found for: LIPASE     Studies/Results: Ir Fluoro Guide Cv Line Right  Result Date: 01/07/2018 INDICATION: 57 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 Patency of the right internal jugular vein was confirmed with ultrasound with image documentation. Patient was prepped and draped in the usual sterile fashion including the right neck and right superior chest. Using ultrasound guidance, the skin and subcutaneous tissues overlying the right internal jugular vein were generously infiltrated with 1% lidocaine without epinephrine. Using ultrasound guidance, the right internal jugular vein was punctured with a micropuncture needle, and an 018 wire was advanced into the right heart confirming venous access. A small stab incision was made with an 11 blade scalpel. Peel-away sheath was placed over the wire, and then the wire was removed, marking the wire for estimation of internal catheter length. The chest wall was then generously infiltrated with 1% lidocaine for local anesthesia along the tissue tract. Small stab incision was made with 11 blade scalpel, and then the catheter was back tunneled to the puncture site at the right internal jugular vein. Catheter was pulled through the tract, with the catheter amputated at 23.5 cm cm. Catheter was advanced through the peel-away sheath, and the peel-away sheath was removed. Final image was stored. The catheter was anchored to  the chest wall with 2 retention sutures, and Derma bond was used to seal the right internal jugular vein incision site and at the right chest wall. Patient tolerated the procedure well and remained hemodynamically stable throughout. No complications were encountered and no significant blood loss was encountered. IMPRESSION: Status post right IJ tunneled cuffed catheter placement. Catheter ready for use. Signed, Yvone Neu.  Reyne Dumas, RPVI Vascular and Interventional Radiology Specialists [facility] Radiology Electronically Signed   By: Gilmer Mor D.O.   On: 01/07/2018 11:42   Ir US Guide Vasc Access Right  Result Date: 01/07/2018 INDICATION: 57 year old male with a history of above knee amputation EXAM: ULTRASOUND-GUIDED TUNNELED CENTRAL CATHETER MEDICATIONS: None ANESTHESIA/SEDATION: None FLUOROSCOPY TIME:  Fluoroscopy Time: 0 minutes 6 seconds COMPLICATIONS: None PROCEDURE: After written informed consent was obtained, patient was placed in the supine position on angiographic table. Patency of the right internal jugular vein was confirmed with ultrasound with image documentation. Patient was prepped and draped in the usual sterile fashion including the right neck and right superior chest. Using ultrasound guidance, the skin and subcutaneous tissues overlying the right internal jugular vein were generously infiltrated with 1% lidocaine without epinephrine. Using ultrasound guidance, the right internal jugular vein was punctured with a micropuncture needle, and an 018 wire was advanced into the right heart confirming venous access. A small stab incision was made with an 11 blade scalpel. Peel-away sheath was placed over the wire, and then the wire was removed, marking the wire for estimation of internal catheter length. The chest wall was then generously infiltrated with 1% lidocaine for local anesthesia along the tissue tract. Small stab incision was made with 11 blade scalpel, and then the catheter was back tunneled to the puncture site at the right internal jugular vein. Catheter was pulled through the tract, with the catheter amputated at 23.5 cm cm. Catheter was advanced through the peel-away sheath, and the peel-away sheath was removed. Final image was stored. The catheter was anchored to the chest wall with 2 retention sutures, and Derma bond was used to seal the right internal jugular vein incision site and at the right  chest wall. Patient tolerated the procedure well and remained hemodynamically stable throughout. No complications were encountered and no significant blood loss was encountered. IMPRESSION: Status post right IJ tunneled cuffed catheter placement. Catheter ready for use. Signed, Yvone Neu. Reyne Dumas, RPVI Vascular and Interventional Radiology Specialists [facility] Radiology Electronically Signed   By: Gilmer Mor D.O.   On: 01/07/2018 11:42    Anti-infectives: Anti-infectives (From admission, onward)   Start     Dose/Rate Route Frequency Ordered Stop   01/06/18 1400  ceFAZolin (ANCEF) IVPB 1 g/50 mL premix     1 g 100 mL/hr over 30 Minutes Intravenous Every 6 hours 01/06/18 1001 01/07/18 0318   01/06/18 0715  ceFAZolin (ANCEF) IVPB 2g/100 mL premix  Status:  Discontinued     2 g 200 mL/hr over 30 Minutes Intravenous To ShortStay Surgical 01/05/18 1921 01/06/18 0942   01/05/18 2300  vancomycin (VANCOCIN) IVPB 1000 mg/200 mL premix     1,000 mg 200 mL/hr over 60 Minutes Intravenous Every 12 hours 01/05/18 1453     01/05/18 1530  cephALEXin (KEFLEX) capsule 500 mg  Status:  Discontinued     500 mg Oral Every 8 hours 01/05/18 1457 01/06/18 1001       Assessment/Plan Sacral decubitus ulcer - s/p I&D 01/08/18 Dr. Corliss Skains - dressing changes BID - continue pressure relief with air  overlay mattress  FEN: reg diet VTE: SQ heparin ID: PO keflex 8/24; vanc 8/24>>, ancef 8/25  LOS: 3 days    Wells Guiles , [facility] Surgery 01/08/2018, 11:36 AM Pager: [phone] Consults: [phone] Mon-Fri 7:00 am-4:30 pm Sat-Sun 7:00 am-11:30 am

## 2018-01-08 NOTE — Progress Notes (Signed)
Inpatient Rehabilitation  Met with patient at bedside to discuss team's recommendation for post acute rehab.  Shared expectations and anticipated outcomes from our program.  Patient states that he agress he cannot tolerate 3 hours of therapy a day.  He expressed desire to return home with his parents, but also acknowledged he might need SNF.  Will sign off at this time and notified CSW of above.  Call if questions.     , M.A., CCC/SLP Admission Coordinator  [facility]  Cell [phone]  

## 2018-01-08 NOTE — Progress Notes (Signed)
PT Cancellation Note  Patient Details Name: Jory SimsDavid Poitras MRN: [id number] DOB: December 11, 1960   Cancelled Treatment:    Reason Eval/Treat Not Completed: Patient at procedure or test/unavailable Patient receiving wound care when PT attempted to work with patient; will attempt to return if able/if time and schedule allow.    Nedra HaiKristen Navarro Nine PT, DPT, CBIS  Supplemental Physical Therapist [facility]   Pager [phone]

## 2018-01-09 ENCOUNTER — Encounter (HOSPITAL_COMMUNITY): Payer: Self-pay

## 2018-01-09 ENCOUNTER — Other Ambulatory Visit: Payer: Self-pay

## 2018-01-09 LAB — CBC WITH DIFFERENTIAL/PLATELET
ABS IMMATURE GRANULOCYTES: 0.1 10*3/uL (ref 0.0–0.1)
BASOS PCT: 0 %
Basophils Absolute: 0 10*3/uL (ref 0.0–0.1)
EOS ABS: 0.3 10*3/uL (ref 0.0–0.7)
Eosinophils Relative: 3 %
HCT: 25.9 % — ABNORMAL LOW (ref 39.0–52.0)
Hemoglobin: 7.8 g/dL — ABNORMAL LOW (ref 13.0–17.0)
Immature Granulocytes: 1 %
Lymphocytes Relative: 22 %
Lymphs Abs: 2.2 10*3/uL (ref 0.7–4.0)
MCH: 28.2 pg (ref 26.0–34.0)
MCHC: 30.1 g/dL (ref 30.0–36.0)
MCV: 93.5 fL (ref 78.0–100.0)
MONO ABS: 0.7 10*3/uL (ref 0.1–1.0)
MONOS PCT: 7 %
NEUTROS ABS: 6.6 10*3/uL (ref 1.7–7.7)
Neutrophils Relative %: 67 %
PLATELETS: 373 10*3/uL (ref 150–400)
RBC: 2.77 MIL/uL — ABNORMAL LOW (ref 4.22–5.81)
RDW: 16.2 % — ABNORMAL HIGH (ref 11.5–15.5)
WBC: 10 10*3/uL (ref 4.0–10.5)

## 2018-01-09 LAB — BASIC METABOLIC PANEL
Anion gap: 3 — ABNORMAL LOW (ref 5–15)
BUN: 10 mg/dL (ref 6–20)
CO2: 31 mmol/L (ref 22–32)
Calcium: 7.1 mg/dL — ABNORMAL LOW (ref 8.9–10.3)
Chloride: 109 mmol/L (ref 98–111)
Creatinine, Ser: 0.63 mg/dL (ref 0.61–1.24)
GFR calc Af Amer: >60 mL/min (ref >60)
GLUCOSE: 105 mg/dL — AB (ref 70–99)
POTASSIUM: 3.9 mmol/L (ref 3.5–5.1)
Sodium: 143 mmol/L (ref 135–145)

## 2018-01-09 LAB — FOLATE: Folate: 7 ng/mL (ref >5.9)

## 2018-01-09 LAB — VITAMIN B12: VITAMIN B 12: 373 pg/mL (ref 180–914)

## 2018-01-09 LAB — GLUCOSE, CAPILLARY
GLUCOSE-CAPILLARY: 57 mg/dL — AB (ref 70–99)
GLUCOSE-CAPILLARY: 60 mg/dL — AB (ref 70–99)
Glucose-Capillary: 111 mg/dL — ABNORMAL HIGH (ref 70–99)

## 2018-01-09 LAB — IRON AND TIBC
Iron: 44 ug/dL — ABNORMAL LOW (ref 45–182)
Saturation Ratios: 40 % — ABNORMAL HIGH (ref 17.9–39.5)
TIBC: 111 ug/dL — AB (ref 250–450)
UIBC: 67 ug/dL

## 2018-01-09 LAB — OCCULT BLOOD X 1 CARD TO LAB, STOOL: Fecal Occult Bld: NEGATIVE

## 2018-01-09 LAB — LACTIC ACID, PLASMA: LACTIC ACID, VENOUS: 1.5 mmol/L (ref 0.5–1.9)

## 2018-01-09 LAB — FERRITIN: Ferritin: 172 ng/mL (ref 24–336)

## 2018-01-09 LAB — RETICULOCYTES
RBC.: 3.02 MIL/uL — ABNORMAL LOW (ref 4.22–5.81)
RETIC COUNT ABSOLUTE: 87.6 10*3/uL (ref 19.0–186.0)
RETIC CT PCT: 2.9 % (ref 0.4–3.1)

## 2018-01-09 LAB — CORTISOL: CORTISOL PLASMA: 3.9 ug/dL

## 2018-01-09 LAB — ABO/RH: ABO/RH(D): A POS

## 2018-01-09 MED ORDER — DEXTROSE 50 % IV SOLN: 1.0000 | Freq: Once | INTRAVENOUS | Status: AC

## 2018-01-09 MED ORDER — DEXTROSE 50 % IV SOLN
INTRAVENOUS | Status: AC
  Administered 2018-01-09: 50 mL
  Filled 2018-01-09: qty 50

## 2018-01-09 MED ORDER — SODIUM CHLORIDE 0.9 % IV BOLUS
500.0000 mL | Freq: Once | INTRAVENOUS | Status: AC
  Administered 2018-01-09: 500 mL via INTRAVENOUS

## 2018-01-09 MED ORDER — FERROUS SULFATE 325 (65 FE) MG PO TABS
325.0000 mg | ORAL_TABLET | Freq: Two times a day (BID) | ORAL | Status: DC
  Administered 2018-01-09 – 2018-01-17 (×16): 325 mg via ORAL
  Filled 2018-01-09 (×15): qty 1

## 2018-01-09 MED ORDER — POLYETHYLENE GLYCOL 3350 17 G PO PACK
17.0000 g | PACK | Freq: Two times a day (BID) | ORAL | Status: DC
  Administered 2018-01-09 – 2018-01-16 (×9): 17 g via ORAL
  Filled 2018-01-09 (×14): qty 1

## 2018-01-09 MED ORDER — COSYNTROPIN 0.25 MG IJ SOLR
0.2500 mg | Freq: Once | INTRAMUSCULAR | Status: AC
  Administered 2018-01-10: 0.25 mg via INTRAVENOUS
  Filled 2018-01-09: qty 0.25

## 2018-01-09 NOTE — Clinical Social Work Note (Signed)
Clinical Social Work Assessment  Patient Details  Name: Kevin Whitney MRN: [id number] Date of Birth: August 29, 1960  Date of referral:                  Reason for consult:  Facility Placement                Permission sought to share information with:  Facility Sport and exercise psychologist, Family Supports Permission granted to share information::  Yes, Verbal Permission Granted  Name::     Kevin Whitney  Agency::  SNFs  Relationship::  dad  Contact Information:  [phone]  Housing/Transportation Living arrangements for the past 2 months:  Mobile Home Source of Information:  Patient Patient Interpreter Needed:  None Criminal Activity/Legal Involvement Pertinent to Current Situation/Hospitalization:  No - Comment as needed Significant Relationships:  Adult Children, Spouse Lives with:  Parents Do you feel safe going back to the place where you live?  Yes Need for family participation in patient care:  Yes (Comment)  Care giving concerns:  Pt has been living in a mobile home with his family, he states he does not have a ramp or ability to get out and about in community. Pt has sacral wound and new amputation.    Social Worker assessment / plan:  CSW met with pt at bedside, pt very chatty and willing to talk. Pt lives at home with his parents in their mobile home, he is very dependent on being near them as he mentioned multiple times about their advanced age. Pt has a wound nurse that comes 2x week but recognizes that maybe SNF is a more appropriate plan. Pt has been to SNF before and didn't have the highest opinion of the care that is provided. Pt willing to go to another close to his parents. Pt goal is to move back into his own mobile home once a ramp is installed and he can more easily transfer in and out of his chair.   Pt given information about [facility] in [place] (only option not in [employer], pt not amenable to [employer]). He will consider.   Employment status:   Disabled (Comment on whether or not currently receiving Disability) Insurance information:  Programmer, applications PT Recommendations:  68 Hour Supervision, [place] / Referral to community resources:  [place]  Patient/Family's Response to care:  Pt eager to speak with CSW and understands recommendations, pt knows CIR was denied and is in between SNF and home.   Patient/Family's Understanding of and Emotional Response to Diagnosis, Current Treatment, and Prognosis:  Pt understanding of diagnosis, current treatment and prognosis. Pt understands that he will need support with future care, pt emotionally appropriate although sometimes can get off topic and requires some redirection to set goals and talk about current needs. Pt appears happy with care at hospital.   Emotional Assessment Appearance:  Appears older than stated age Attitude/Demeanor/Rapport:    Affect (typically observed):  Accepting, Appropriate, Pleasant(Chatty) Orientation:  Oriented to Self, Oriented to Place, Oriented to  Time, Oriented to Situation Alcohol / Substance use:  Tobacco Use(currently) Psych involvement (Current and /or in the community):  No (Comment)  Discharge Needs  Concerns to be addressed:  Care Coordination, Discharge Planning Concerns Readmission within the last 30 days:  No Current discharge risk:  Physical Impairment, Dependent with Mobility Barriers to Discharge:  Continued Medical Work up, [employer], [place] 01/09/2018, 11:41 AM

## 2018-01-09 NOTE — Plan of Care (Signed)
  Problem: Health Behavior/Discharge Planning: Goal: Ability to manage health-related needs will improve Outcome: Progressing   Problem: Clinical Measurements: Goal: Respiratory complications will improve Outcome: Progressing Goal: Cardiovascular complication will be avoided Outcome: Progressing   Problem: Activity: Goal: Risk for activity intolerance will decrease Outcome: Progressing   

## 2018-01-09 NOTE — Care Management Note (Signed)
Case Management Note  Patient Details  Name: Kevin SimsDavid Whitney MRN: [id number] Date of Birth: Sep 22, 1960  Subjective/Objective:                    Action/Plan: Spoke to patient regarding disposition. Face sheet information is his parents address, patient lives next door , just moved into a double wide that was "especially built for me, I  just need a ramp put in".  [facility] [phone]. We discuss SNF for short term vs home with home health. Patient wants SNF short term. His father is [phone] and his mother is [phone] so he is interested in a SNF close to them so they do not have to drive very far to visit him. Once SNF is completed then he would like to resume his home health services with [facility].    Expected Discharge Date:                  Expected Discharge Plan:  Skilled Nursing Facility  In-House Referral:  Clinical Social Work  Discharge planning Services  CM Consult  Post Acute Care Choice:    Choice offered to:  Patient  DME Arranged:    DME Agency:     HH Arranged:    HH Agency:     Status of Service:  In process, will continue to follow  If discussed at Long Length of Stay Meetings, dates discussed:    Additional Comments:  Kingsley PlanWile, Poonam Woehrle Marie, RN 01/09/2018, 10:49 AM

## 2018-01-09 NOTE — Social Work (Addendum)
CSW gave information to pt regarding SNF that is closest proximity wise. CSW let pt know that we need choice of facility for insurance authorization to be started, pt expressed that he was in too much pained and moaned throughout CSW visit. Will revisit when pt more amenable this afternoon.   2:26pm- Pt PASRR received: [id number]  Doy HutchingIsabel H Carlena Ruybal, LCSWA [place] Clinical Social Work [phone]

## 2018-01-09 NOTE — Progress Notes (Addendum)
Paged K. Schorr notification BP 76/58, HR 86. Has had similar pressures all day. Patient is A&O x4, he states it is normal for him and that he has gotten down to 60/40 at times.  Order for 500 mL bolus.  [id number] IV team assessed patient's two CL; request order to d/c triple lumen placed prior to admission. Will have day shift f/u with provider.   [phone] paged K. Schorr, BP 82/52, HR 82. 2nd fluid bolus ordered.

## 2018-01-09 NOTE — Progress Notes (Signed)
PROGRESS NOTE    Kevin Whitney  ZOX:[id number] DOB: 04-May-1961 DOA: 01/05/2018 PCP: [name], MD    Brief Narrative: Kevin Whitney a 57 y.o.malewith medical history significant for PVD, tobacco abuse, degenerative disc disease of the spine status post operation, disuse atrophy of his muscles, history of MRSA of the spine and chronic narcotic dependence (on fentanyl patches) presented to the ED at [facility] via EMS due to dysuria and pressure ulcer of the left upper back which is chronic but developing a left lower back and sacral ulcer. Further work-up showed MRSA bacteremia, likely source his sacral ulcer. Pt was also found to have LLE gangrene, CT arteriogram showed severe PVD, and was subsequently transferred to [facility] for AKA by Dr Lajoyce Corners. General surgery was also consulted for his multiple ulcers that may need debridement. Pt admitted for further management.   Assessment & Plan:   Principal Problem:   Atherosclerosis of native artery of left lower extremity with gangrene (HCC) Active Problems:   Left buttock abscess   Stage III pressure ulcer of buttock (HCC)   MRSA bacteremia   Antibiotic-associated diarrhea   Proteus infection   Acute lower UTI   Pressure ulcer of left upper back, stage 3 (HCC)   Hypokalemia   H/O degenerative disc disease   Chronic narcotic dependence (HCC)   Disuse atrophy of muscle   Tobacco abuse counseling   Right middle lobe pulmonary nodule   Chronic diastolic CHF (congestive heart failure) (HCC)   Non-healing ulcer of groin with fat layer exposed (HCC)   Gangrene of left foot (HCC)   Severe protein-calorie malnutrition (HCC)   Bacteremia   Pressure injury of skin of sacral region   DDD (degenerative disc disease), lumbosacral   Chronic pain syndrome   Leukocytosis   Acute blood loss anemia  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  Arteriogram studies shows complete occlusion of the popliteal fossa on the  left Aspirin, Lipitor.   Sacral Decubitus;  S/P debridement on 01/07/18 Surgery following.   MRSA Bacteremia;  Required IV pressors on admission at [facility].  BC at [facility] grew MRSA>  Repeated Blood culture;   TTE and TEE done at [facility] The are both unremarkable Case was discussed by Dr Sharolyn [name] with Dr Orvan Falconer, and patient will need 3 Weeks of IV antibiotics. (Vancomycin)   Has Right IJ lumen tunneled placed by IR    Hypotension;  Overnight. IV fluids.  Lactic acid normal.  Cortisol low normal, check ACTH test.  On midodrine.   Anemia; hb trending down.  Check anemia panel.  Stool occult ordered and is negative.  Will start oral iron.   Hypoglycemia;  Check cortisol.  Amp d 50   proteus UTI; treated with keflex.   Chronic diastolic HF, stable.   Malnutrition.  Ensure.   Chronic pain syndrome/narcotic dependence Continue pain management with home fentanyl patch    DVT prophylaxis: heparin  Code Status: full code.  Family Communication: care discussed with patient.  Disposition Plan: needs SNF  Consultants:   Dr Lajoyce Corners   Procedures:    Antimicrobials:   Vancomycin 8-24   Subjective: He has not had BM since admission.  He is not very regular with BM.  He denies dyspnea. Denies lightheaded   Objective: Vitals:   01/08/18 2057 01/09/18 0153 01/09/18 0446 01/09/18 0838  BP: (!) 85/69 (!) 76/58 (!) 82/52 100/70  Pulse: 96 86 82   Resp: 16 16 16    Temp:  98.1 F (36.7 C) 98.1 F (36.7 C) 99 F (37.2 C)   TempSrc: Oral Oral Oral   SpO2: 97% 98% 98%   Weight:      Height:        Intake/Output Summary (Last 24 hours) at 01/09/2018 0851 Last data filed at 01/09/2018 [id number] Gross per 24 hour  Intake 3722.82 ml  Output 2800 ml  Net 922.82 ml   Filed Weights   01/05/18 1305 01/06/18 0418 01/07/18 1126  Weight: 73.6 kg 60.9 kg 60.9 kg    Examination:  General exam: Appears calm and comfortable  Respiratory system: Clear to auscultation.  Respiratory effort normal. Cardiovascular system: S1 & S2 heard, RRR. No JVD, murmurs, rubs, gallops or clicks. No pedal edema. Gastrointestinal system: Abdomen is nondistended, soft and nontender. No organomegaly or masses felt. Normal bowel sounds heard. Central nervous system: Alert and oriented. Extremities: left LE with wound vac.  Skin: decubitus ulcer.    Data Reviewed: I have personally reviewed following labs and imaging studies  CBC: Recent Labs  Lab 01/05/18 1506 01/06/18 0337 01/07/18 0325 01/08/18 0453 01/09/18 0505  WBC 14.7* 13.4* 13.6* 12.9* 10.0  NEUTROABS  --   --  9.5* 10.6* 6.6  HGB 10.4* 9.9* 8.5* 8.1* 7.8*  HCT 33.7* 31.7* 27.6* 26.6* 25.9*  MCV 90.6 90.1 92.0 92.0 93.5  PLT 277 276 319 335 373   Basic Metabolic Panel: Recent Labs  Lab 01/05/18 1506 01/06/18 0337 01/07/18 0325 01/08/18 0453 01/09/18 0505  NA  --  141 140 139 143  K  --  3.5 3.9 4.2 3.9  CL  --  113* 111 109 109  CO2  --  24 25 27 31   GLUCOSE  --  72 76 115* 105*  BUN  --  <5* <5* 5* 10  CREATININE 0.53* 0.54* 0.50* 0.61 0.63  CALCIUM  --  6.8* 6.7* 7.2* 7.1*  MG  --   --  2.3  --   --   PHOS  --  2.3*  --   --   --    GFR: Estimated Creatinine Clearance: 89.9 mL/min (by C-G formula based on SCr of 0.63 mg/dL). Liver Function Tests: No results for input(s): AST, ALT, ALKPHOS, BILITOT, PROT, ALBUMIN in the last 168 hours. No results for input(s): LIPASE, AMYLASE in the last 168 hours. No results for input(s): AMMONIA in the last 168 hours. Coagulation Profile: No results for input(s): INR, PROTIME in the last 168 hours. Cardiac Enzymes: No results for input(s): CKTOTAL, CKMB, CKMBINDEX, TROPONINI in the last 168 hours. BNP (last 3 results) No results for input(s): PROBNP in the last 8760 hours. HbA1C: No results for input(s): HGBA1C in the last 72 hours. CBG: Recent Labs  Lab 01/06/18 0637 01/07/18 0831 01/08/18 0756 01/09/18 0752 01/09/18 0828  GLUCAP 154* 54*  111* 57* 60*   Lipid Profile: No results for input(s): CHOL, HDL, LDLCALC, TRIG, CHOLHDL, LDLDIRECT in the last 72 hours. Thyroid Function Tests: No results for input(s): TSH, T4TOTAL, FREET4, T3FREE, THYROIDAB in the last 72 hours. Anemia Panel: No results for input(s): VITAMINB12, FOLATE, FERRITIN, TIBC, IRON, RETICCTPCT in the last 72 hours. Sepsis Labs: No results for input(s): PROCALCITON, LATICACIDVEN in the last 168 hours.  Recent Results (from the past 240 hour(s))  Culture, blood (routine x 2)     Status: None (Preliminary result)   Collection Time: 01/06/18 12:26 PM  Result Value Ref Range Status   Specimen Description BLOOD LEFT ANTECUBITAL  Final  Special Requests   Final    BOTTLES DRAWN AEROBIC AND ANAEROBIC Blood Culture adequate volume   Culture   Final    NO GROWTH 3 DAYS Performed at [facility], [address] [place], [place] [address]    Report Status PENDING  Incomplete  Culture, blood (routine x 2)     Status: None (Preliminary result)   Collection Time: 01/06/18 12:26 PM  Result Value Ref Range Status   Specimen Description BLOOD RIGHT ANTECUBITAL  Final   Special Requests   Final    BOTTLES DRAWN AEROBIC AND ANAEROBIC Blood Culture adequate volume   Culture   Final    NO GROWTH 3 DAYS Performed at [facility], [address], [place], [place] [address]    Report Status PENDING  Incomplete  Aerobic/Anaerobic Culture (surgical/deep wound)     Status: None (Preliminary result)   Collection Time: 01/07/18  1:25 PM  Result Value Ref Range Status   Specimen Description ABSCESS  Final   Special Requests SACRAL DECUBITUS ULCER  Final   Gram Stain   Final    ABUNDANT WBC PRESENT, PREDOMINANTLY PMN FEW GRAM POSITIVE COCCI Performed at [facility], [address] [place], [place] [address]    Culture MODERATE STAPHYLOCOCCUS AUREUS  Final   Report Status PENDING  Incomplete         Radiology Studies: Ir Fluoro Guide Cv Line  Right  Result Date: 01/07/2018 INDICATION: 57 year old male with a history of above knee amputation EXAM: ULTRASOUND-GUIDED TUNNELED CENTRAL CATHETER MEDICATIONS: None ANESTHESIA/SEDATION: None FLUOROSCOPY TIME:  Fluoroscopy Time: 0 minutes 6 seconds COMPLICATIONS: None PROCEDURE: After written informed consent was obtained, patient was placed in the supine position on angiographic table. Patency of the right internal jugular vein was confirmed with ultrasound with image documentation. Patient was prepped and draped in the usual sterile fashion including the right neck and right superior chest. Using ultrasound guidance, the skin and subcutaneous tissues overlying the right internal jugular vein were generously infiltrated with 1% lidocaine without epinephrine. Using ultrasound guidance, the right internal jugular vein was punctured with a micropuncture needle, and an 018 wire was advanced into the right heart confirming venous access. A small stab incision was made with an 11 blade scalpel. Peel-away sheath was placed over the wire, and then the wire was removed, marking the wire for estimation of internal catheter length. The chest wall was then generously infiltrated with 1% lidocaine for local anesthesia along the tissue tract. Small stab incision was made with 11 blade scalpel, and then the catheter was back tunneled to the puncture site at the right internal jugular vein. Catheter was pulled through the tract, with the catheter amputated at 23.5 cm cm. Catheter was advanced through the peel-away sheath, and the peel-away sheath was removed. Final image was stored. The catheter was anchored to the chest wall with 2 retention sutures, and Derma bond was used to seal the right internal jugular vein incision site and at the right chest wall. Patient tolerated the procedure well and remained hemodynamically stable throughout. No complications were encountered and no significant blood loss was encountered.  IMPRESSION: Status post right IJ tunneled cuffed catheter placement. Catheter ready for use. Signed, Yvone Neu. Reyne Dumas, RPVI [facility] Radiology Electronically Signed   By: Gilmer Mor D.O.   On: 01/07/2018 11:42   Ir US Guide Vasc Access Right  Result Date: 01/07/2018 INDICATION: 57 year old male with a history of  above knee amputation EXAM: ULTRASOUND-GUIDED TUNNELED CENTRAL CATHETER MEDICATIONS: None ANESTHESIA/SEDATION: None FLUOROSCOPY TIME:  Fluoroscopy Time: 0 minutes 6 seconds COMPLICATIONS: None PROCEDURE: After written informed consent was obtained, patient was placed in the supine position on angiographic table. Patency of the right internal jugular vein was confirmed with ultrasound with image documentation. Patient was prepped and draped in the usual sterile fashion including the right neck and right superior chest. Using ultrasound guidance, the skin and subcutaneous tissues overlying the right internal jugular vein were generously infiltrated with 1% lidocaine without epinephrine. Using ultrasound guidance, the right internal jugular vein was punctured with a micropuncture needle, and an 018 wire was advanced into the right heart confirming venous access. A small stab incision was made with an 11 blade scalpel. Peel-away sheath was placed over the wire, and then the wire was removed, marking the wire for estimation of internal catheter length. The chest wall was then generously infiltrated with 1% lidocaine for local anesthesia along the tissue tract. Small stab incision was made with 11 blade scalpel, and then the catheter was back tunneled to the puncture site at the right internal jugular vein. Catheter was pulled through the tract, with the catheter amputated at 23.5 cm cm. Catheter was advanced through the peel-away sheath, and the peel-away sheath was removed. Final image was stored. The catheter was anchored to the chest wall with 2  retention sutures, and Derma bond was used to seal the right internal jugular vein incision site and at the right chest wall. Patient tolerated the procedure well and remained hemodynamically stable throughout. No complications were encountered and no significant blood loss was encountered. IMPRESSION: Status post right IJ tunneled cuffed catheter placement. Catheter ready for use. Signed, Yvone Neu. Reyne Dumas, RPVI [facility] Radiology Electronically Signed   By: Gilmer Mor D.O.   On: 01/07/2018 11:42        Scheduled Meds: . aspirin EC  81 mg Oral Daily  . atorvastatin  40 mg Oral q1800  . dextrose  1 ampule Intravenous Once  . docusate sodium  100 mg Oral BID  . feeding supplement (ENSURE ENLIVE)  237 mL Oral TID BM  . feeding supplement (PRO-STAT SUGAR FREE 64)  30 mL Oral BID  . fentaNYL  75 mcg Transdermal Q72H  . heparin  5,000 Units Subcutaneous Q8H  . midodrine  5 mg Oral TID WC  . multivitamin with minerals  1 tablet Oral Daily  . polyethylene glycol  17 g Oral BID   Continuous Infusions: . sodium chloride Stopped (01/08/18 1625)  . dextrose 5 % and 0.45% NaCl 75 mL/hr at 01/09/18 0714  . methocarbamol (ROBAXIN) IV    . vancomycin 1,000 mg (01/08/18 2217)     LOS: 4 days    Time spent: 35 minutes.     Alba Cory, MD [facility] Pager [phone]  If 7PM-7AM, please contact night-coverage [email or web address] Password [facility] 01/09/2018, 8:51 AM

## 2018-01-09 NOTE — Progress Notes (Signed)
Hypoglycemic Event  CBG: 57  Treatment: d50%  Symptoms: none  Follow-up CBG: Time900 CBG Result111    Comments/MD notified:Dr.Regalado    Alonza BogusKristie G Divya Munshi

## 2018-01-09 NOTE — Progress Notes (Signed)
Physical Therapy Treatment Patient Details Name: Kevin Whitney MRN: [id number] DOB: [birth year] Today's Date: 01/09/2018    History of Present Illness Kevin Whitney is a 57 y.o. male with medical history significant for PVD, tobacco abuse, degenerative disc disease of the spine status post operation, disuse atrophy of his muscles, history of MRSA of the spine and chronic narcotic dependence (on fentanyl patches) presented to the ED at [facility] via EMS due to dysuria and pressure ulcer of the left upper back which is chronic but developing a left lower back and sacral ulcer. Further work-up showed MRSA bacteremia, likely source his sacral ulcer.  Pt was also found to have LLE gangrene, CT arteriogram showed severe PVD, and was subsequently transferred to [facility] for AKA by Dr Lajoyce Corners.    PT Comments    Pt received in bed agreeable to participation in therapy. He required +2 mod assist to position long sitting in bed. Max assist required to maintain sitting balance. +2 max assist for posterior transfer into recliner using bed pad. Pt positioned in recliner with foot elevated at end of session. Per CIR coordinator note, pt unable to tolerate 3 hrs of therapy per day and, therefore, does not qualify for inpt rehab stay. Discharge recommendation updated to SNF.    Follow Up Recommendations  SNF     Equipment Recommendations  Other (comment)(defer to next venue)    Recommendations for Other Services       Precautions / Restrictions Precautions Precautions: Fall;Other (comment) Precaution Comments: Contact Precautions, wound vac Restrictions LLE Weight Bearing: Non weight bearing    Mobility  Bed Mobility Overal bed mobility: Needs Assistance Bed Mobility: Supine to Sit     Supine to sit: +2 for physical assistance;Mod assist     General bed mobility comments: Assist to manage trunk and BLE.  Transfers   Equipment used: None Transfers: Education officer, community transfers: +2 physical assistance;Max assist   General transfer comment: posterior transfer bed to recliner using bed pad. Continuous cues for sequencing. Max assist to maintain sitting balance.  Ambulation/Gait             General Gait Details: nonambulatory   Stairs             Wheelchair Mobility    Modified Rankin (Stroke Patients Only)       Balance Overall balance assessment: Needs assistance Sitting-balance support: Bilateral upper extremity supported;Feet unsupported Sitting balance-Leahy Scale: Poor Sitting balance - Comments: Relies on BUE support and external support                                    Cognition Arousal/Alertness: Awake/alert Behavior During Therapy: WFL for tasks assessed/performed Overall Cognitive Status: No family/caregiver present to determine baseline cognitive functioning                     Current Attention Level: Selective       Awareness: Emergent Problem Solving: Slow processing;Difficulty sequencing;Requires verbal cues General Comments: requires continuous redirection       Exercises      General Comments        Pertinent Vitals/Pain Pain Assessment: Faces Faces Pain Scale: Hurts a little bit Pain Location: LLE and wounds  Pain Descriptors / Indicators: Operative site guarding Pain Intervention(s): Premedicated before session;Limited activity within patient's tolerance;Repositioned;Monitored during session    Home Living  Prior Function            PT Goals (current goals can now be found in the care plan section) Acute Rehab PT Goals Patient Stated Goal: home PT Goal Formulation: With patient Time For Goal Achievement: 01/20/18 Potential to Achieve Goals: Good Progress towards PT goals: Progressing toward goals    Frequency    Min 3X/week      PT Plan Discharge plan needs to be updated    Co-evaluation               AM-PAC PT "6 Clicks" Daily Activity  Outcome Measure  Difficulty turning over in bed (including adjusting bedclothes, sheets and blankets)?: None Difficulty moving from lying on back to sitting on the side of the bed? : A Lot Difficulty sitting down on and standing up from a chair with arms (e.g., wheelchair, bedside commode, etc,.)?: Unable Help needed moving to and from a bed to chair (including a wheelchair)?: A Lot Help needed walking in hospital room?: Total Help needed climbing 3-5 steps with a railing? : Total 6 Click Score: 11    End of Session   Activity Tolerance: Patient tolerated treatment well Patient left: in chair;with call bell/phone within reach Nurse Communication: Mobility status PT Visit Diagnosis: Muscle weakness (generalized) (M62.81);Other abnormalities of gait and mobility (R26.89);Other symptoms and signs involving the nervous system (R29.898);Pain Pain - Right/Left: Left Pain - part of body: Leg     Time: [id number] PT Time Calculation (min) (ACUTE ONLY): 28 min  Charges:  $Therapeutic Activity: 23-37 mins                     Aida RaiderWendy Ashar Lewinski, PT  Office # [phone] Pager [phone]    Ilda FoilGarrow, Alnisa Hasley Rene 01/09/2018, 12:15 PM

## 2018-01-09 NOTE — Progress Notes (Signed)
2230 dressing change to sacral wound; noted small draining area superior to main wound. Covered with gauze and taped.

## 2018-01-09 NOTE — Progress Notes (Signed)
[facility] Progress Note  2 Days Post-Op  Subjective: CC: sacral wound Sacral wound that was surgically debrided still clean and looking improved. RN reported concern that there seemed to be a small opening above this wound draining purulent material overnight. Patient states he feels burning all in his back and some fullness.   Objective: Vital signs in last 24 hours: Temp:  [98.1 F (36.7 C)-99 F (37.2 C)] 99 F (37.2 C) (08/28 0446) Pulse Rate:  [72-98] 82 (08/28 0446) Resp:  [16] 16 (08/28 0446) BP: (76-100)/(52-73) 100/70 (08/28 0838) SpO2:  [95 %-98 %] 98 % (08/28 0446) Last BM Date: 01/08/18  Intake/Output from previous day: 08/27 0701 - 08/28 0700 In: 3282.8 [P.O.:900; I.V.:1331.2; IV Piggyback:1051.7] Out: 2300 [Urine:2300] Intake/Output this shift: Total I/O In: 440 [P.O.:440] Out: 500 [Urine:500]  PE: Gen:  Alert, NAD, pleasant Pulm:  Normal effort,  Abd: Soft, non-tender, non-distended GU: testes seem mildly edematous, wound as below with small opening draining purulent material superior to wound, no obvious tract   Psych: A&Ox3   Lab Results:  Recent Labs    01/08/18 0453 01/09/18 0505  WBC 12.9* 10.0  HGB 8.1* 7.8*  HCT 26.6* 25.9*  PLT 335 373   BMET Recent Labs    01/08/18 0453 01/09/18 0505  NA 139 143  K 4.2 3.9  CL 109 109  CO2 27 31  GLUCOSE 115* 105*  BUN 5* 10  CREATININE 0.61 0.63  CALCIUM 7.2* 7.1*   PT/INR No results for input(s): LABPROT, INR in the last 72 hours. CMP     Component Value Date/Time   NA 143 01/09/2018 0505   K 3.9 01/09/2018 0505   CL 109 01/09/2018 0505   CO2 31 01/09/2018 0505   GLUCOSE 105 (H) 01/09/2018 0505   BUN 10 01/09/2018 0505   CREATININE 0.63 01/09/2018 0505   CALCIUM 7.1 (L) 01/09/2018 0505   PROT 5.2 (L) 02/09/2011 0627   ALBUMIN 2.2 (L) 02/09/2011 0627   AST 10 02/09/2011 0627   ALT <5 02/09/2011 0627   ALKPHOS 47 02/09/2011 0627   BILITOT 0.6 02/09/2011 0627   GFRNONAA >60 01/09/2018 0505   GFRAA >60 01/09/2018 0505   Lipase  No results found for: LIPASE     Studies/Results: Ir Fluoro Guide Cv Line Right  Result Date: 01/07/2018 INDICATION: 57 year old male with a history of above knee amputation EXAM: ULTRASOUND-GUIDED TUNNELED CENTRAL CATHETER MEDICATIONS: None ANESTHESIA/SEDATION: None FLUOROSCOPY TIME:  Fluoroscopy Time: 0 minutes 6 seconds COMPLICATIONS: None PROCEDURE: After written informed consent was obtained, patient was placed in the supine position on angiographic table. Patency of the right internal jugular vein was confirmed with ultrasound with image documentation. Patient was prepped and draped in the usual sterile fashion including the right neck and right superior chest. Using ultrasound guidance, the skin and subcutaneous tissues overlying the right internal jugular vein were generously infiltrated with 1% lidocaine without epinephrine. Using ultrasound guidance, the right internal jugular vein was punctured with a micropuncture needle, and an 018 wire was advanced into the right heart confirming venous access. A small stab incision was made with an 11 blade scalpel. Peel-away sheath was placed over the wire, and then the wire was removed, marking the wire for estimation of internal catheter length. The chest wall was then generously infiltrated with 1% lidocaine for local anesthesia along the tissue tract. Small stab incision was made with 11 blade scalpel, and then the catheter was back tunneled to the puncture site at the  right internal jugular vein. Catheter was pulled through the tract, with the catheter amputated at 23.5 cm cm. Catheter was advanced through the peel-away sheath, and the peel-away sheath was removed. Final image was stored. The catheter was anchored to the chest wall with 2 retention sutures, and Derma bond was used to seal the right internal jugular vein incision site and at the right chest wall. Patient tolerated the  procedure well and remained hemodynamically stable throughout. No complications were encountered and no significant blood loss was encountered. IMPRESSION: Status post right IJ tunneled cuffed catheter placement. Catheter ready for use. Signed, Yvone Neu. Reyne Dumas, RPVI [facility] Rad[facility] Radiology Electronically Signed   By: Gilmer Mor D.O.   On: 01/07/2018 11:42   Ir US Guide Vasc Access Right  Result Date: 01/07/2018 INDICATION: 57 year old male with a history of above knee amputation EXAM: ULTRASOUND-GUIDED TUNNELED CENTRAL CATHETER MEDICATIONS: None ANESTHESIA/SEDATION: None FLUOROSCOPY TIME:  Fluoroscopy Time: 0 minutes 6 seconds COMPLICATIONS: None PROCEDURE: After written informed consent was obtained, patient was placed in the supine position on angiographic table. Patency of the right internal jugular vein was confirmed with ultrasound with image documentation. Patient was prepped and draped in the usual sterile fashion including the right neck and right superior chest. Using ultrasound guidance, the skin and subcutaneous tissues overlying the right internal jugular vein were generously infiltrated with 1% lidocaine without epinephrine. Using ultrasound guidance, the right internal jugular vein was punctured with a micropuncture needle, and an 018 wire was advanced into the right heart confirming venous access. A small stab incision was made with an 11 blade scalpel. Peel-away sheath was placed over the wire, and then the wire was removed, marking the wire for estimation of internal catheter length. The chest wall was then generously infiltrated with 1% lidocaine for local anesthesia along the tissue tract. Small stab incision was made with 11 blade scalpel, and then the catheter was back tunneled to the puncture site at the right internal jugular vein. Catheter was pulled through the tract, with the catheter amputated at 23.5 cm cm. Catheter was advanced  through the peel-away sheath, and the peel-away sheath was removed. Final image was stored. The catheter was anchored to the chest wall with 2 retention sutures, and Derma bond was used to seal the right internal jugular vein incision site and at the right chest wall. Patient tolerated the procedure well and remained hemodynamically stable throughout. No complications were encountered and no significant blood loss was encountered. IMPRESSION: Status post right IJ tunneled cuffed catheter placement. Catheter ready for use. Signed, Yvone Neu. Reyne Dumas, RPVI [facility] Interventional Radiology Specialists [facility] Radiology Electronically Signed   By: Gilmer Mor D.O.   On: 01/07/2018 11:42    Anti-infectives: Anti-infectives (From admission, onward)   Start     Dose/Rate Route Frequency Ordered Stop   01/06/18 1400  ceFAZolin (ANCEF) IVPB 1 g/50 mL premix     1 g 100 mL/hr over 30 Minutes Intravenous Every 6 hours 01/06/18 1001 01/07/18 0318   01/06/18 0715  ceFAZolin (ANCEF) IVPB 2g/100 mL premix  Status:  Discontinued     2 g 200 mL/hr over 30 Minutes Intravenous To ShortStay Surgical 01/05/18 1921 01/06/18 0942   01/05/18 2300  vancomycin (VANCOCIN) IVPB 1000 mg/200 mL premix     1,000 mg 200 mL/hr over 60 Minutes Intravenous Every 12 hours 01/05/18 1453     01/05/18 1530  cephALEXin (KEFLEX) capsule 500 mg  Status:  Discontinued  500 mg Oral Every 8 hours 01/05/18 1457 01/06/18 1001       Assessment/Plan Sacral decubitus ulcer - s/p I&D 01/08/18 Dr. Corliss Skainssuei - dressing changes BID - continue pressure relief with air overlay mattress and foam top dressing - will discuss with MD whether this may need further debridement or not  FEN: reg diet VTE: SQ heparin ID: PO keflex 8/24; vanc 8/24>>, ancef 8/25  LOS: 4 days    Wells GuilesKelly Rayburn , [facility] 01/09/2018, 9:44 AM Pager: [phone] Consults: [phone] Mon-Fri 7:00 am-4:30 pm Sat-Sun 7:00 am-11:30  am

## 2018-01-10 LAB — CBC WITH DIFFERENTIAL/PLATELET
Abs Immature Granulocytes: 0.2 10*3/uL — ABNORMAL HIGH (ref 0.0–0.1)
Basophils Absolute: 0.1 10*3/uL (ref 0.0–0.1)
Basophils Relative: 1 %
Eosinophils Absolute: 0.7 10*3/uL (ref 0.0–0.7)
Eosinophils Relative: 5 %
HEMATOCRIT: 26.2 % — AB (ref 39.0–52.0)
HEMOGLOBIN: 7.9 g/dL — AB (ref 13.0–17.0)
IMMATURE GRANULOCYTES: 1 %
LYMPHS ABS: 2.4 10*3/uL (ref 0.7–4.0)
LYMPHS PCT: 16 %
MCH: 28.4 pg (ref 26.0–34.0)
MCHC: 30.2 g/dL (ref 30.0–36.0)
MCV: 94.2 fL (ref 78.0–100.0)
Monocytes Absolute: 0.8 10*3/uL (ref 0.1–1.0)
Monocytes Relative: 6 %
Neutro Abs: 10.6 10*3/uL — ABNORMAL HIGH (ref 1.7–7.7)
Neutrophils Relative %: 71 %
Platelets: 423 10*3/uL — ABNORMAL HIGH (ref 150–400)
RBC: 2.78 MIL/uL — ABNORMAL LOW (ref 4.22–5.81)
RDW: 16.9 % — ABNORMAL HIGH (ref 11.5–15.5)
WBC: 14.8 10*3/uL — ABNORMAL HIGH (ref 4.0–10.5)

## 2018-01-10 LAB — BASIC METABOLIC PANEL
ANION GAP: 5 (ref 5–15)
BUN: 11 mg/dL (ref 6–20)
CHLORIDE: 107 mmol/L (ref 98–111)
CO2: 28 mmol/L (ref 22–32)
CREATININE: 0.48 mg/dL — AB (ref 0.61–1.24)
Calcium: 7.3 mg/dL — ABNORMAL LOW (ref 8.9–10.3)
GFR calc Af Amer: >60 mL/min (ref >60)
GFR calc non Af Amer: >60 mL/min (ref >60)
GLUCOSE: 110 mg/dL — AB (ref 70–99)
Potassium: 3.8 mmol/L (ref 3.5–5.1)
Sodium: 140 mmol/L (ref 135–145)

## 2018-01-10 LAB — ACTH STIMULATION, 3 TIME POINTS
Cortisol, 30 Min: 12.3 ug/dL
Cortisol, 60 Min: 18.3 ug/dL
Cortisol, Base: 3.2 ug/dL

## 2018-01-10 LAB — GLUCOSE, CAPILLARY: Glucose-Capillary: 109 mg/dL — ABNORMAL HIGH (ref 70–99)

## 2018-01-10 MED ORDER — SODIUM CHLORIDE 0.9 % IV BOLUS
500.0000 mL | Freq: Once | INTRAVENOUS | Status: AC
  Administered 2018-01-10: 500 mL via INTRAVENOUS

## 2018-01-10 NOTE — Social Work (Signed)
Wound care note with measurements for wound vac faxed to [place] with [facility] to [phone].   Doy HutchingIsabel H Alean Kromer, LCSWA [facility] Clinical Social Work [phone]

## 2018-01-10 NOTE — Social Work (Signed)
CSW spoke with pt at bedside, along with RN Case ProofreaderManager Heather. Discussed home health vs. SNF, discussed needs for wound vacs and therapy. [facility] [place] and [place].   CSW has initiated authorization with [facility] Medicare and will follow, spoke with wound care nurse, aware pt will go with one prevena wound vac and one that will need to be ordered and changed 3x week. Will [facility] this [facility] for [facility] to order vac.   Doy HutchingIsabel H Valeria Krisko, LCSWA [facility] Clinical Social Work [phone]

## 2018-01-10 NOTE — Consult Note (Signed)
WOC Nurse wound consult note Reason for Consult: placement of NPWT to recently debrided wound Discussed with CCA PA, uncomplicated NPWT placement.  It is a clean wound and should be easy for bedside nurses to change. Will place today, discussed with SW and CM on DC planning for SNF. They will be able to use my notes for SNF report Wound type: full thickness wound, s/p debridement per CCS 01/09/18 Pressure Injury POA: Yes Measurement: 8cm x 6cm x 1.0cm x undermining at 6 o'clock that is 2 cm   Wound ZOX:WRUEAbed:clean, pink, some fibrin but less than 5% Drainage (amount, consistency, odor) minimal on dressing, no odor Periwound: intact, pinpoint area just proximal to the wound, CCS has noted this does not communicate with the current open wound. No drainage noted today at the time of may assessment Dressing procedure/placement/frequency: Peri wound skin prepped with skin barrier wipe. 2pc of black foam used to fill the wound bed Sealed at 125mmHG with drape. Patient tolerated well, received PO muscle relaxer just prior to dressing change.   Noted Prevena NPWT dressing to the left AKA per Dr. Lajoyce Cornersuda. Current hooked to hospital NPWT device. Orders in from Dr. Lajoyce Cornersuda to hook to Prevena at the time of DC. Explained this to the bedside nurse and to the patient. Also discussed with SW planning DC.   Discussed POC with patient and bedside nurse.  Re consult if needed, will not follow at this time. Thanks  Mansour Balboa M.D.[employer]. [employer] MSN, RN,CWOCN, CNS, CWON-AP [phone])

## 2018-01-10 NOTE — Progress Notes (Signed)
Occupational Therapy Treatment Patient Details Name: Kevin Whitney MRN: [id number] DOB: 13-Jan-1961 Today's Date: 01/10/2018    History of present illness Kevin Whitney is a 57 y.o. 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 Further work-up showed MRSA bacteremia, likely source his sacral ulcer.  Pt was also found to have LLE gangrene, CT arteriogram showed severe PVD, and was subsequently transferred to [facility] for AKA by Dr Lajoyce Corners.   OT comments  Pt requiring encouragement to work with OT. Report increased back pain from wound care earlier. Sat EOB x 5 minutes with B UE support. Performed bed level grooming and UB dressing. Updated d/c disposition to SNF.  Follow Up Recommendations  SNF;Supervision/Assistance - 24 hour    Equipment Recommendations       Recommendations for Other Services      Precautions / Restrictions Precautions Precautions: Fall;Other (comment) Precaution Comments: Contact Precautions, wound vacs x2       Mobility Bed Mobility Overal bed mobility: Needs Assistance Bed Mobility: Rolling;Sidelying to Sit;Sit to Sidelying Rolling: Min assist Sidelying to sit: Mod assist     Sit to sidelying: Mod assist General bed mobility comments: Assist to manage trunk and BLE.  Transfers                 General transfer comment: pt declined due to pain    Balance Overall balance assessment: Needs assistance   Sitting balance-Leahy Scale: Poor Sitting balance - Comments: Relies on BUE support and external support                                   ADL either performed or assessed with  clinical judgement   ADL Overall ADL's : Needs assistance/impaired     Grooming: Wash/dry hands;Wash/dry face;Brushing hair;Bed level;Set up           Upper Body Dressing : Minimal assistance;Bed level Upper Body Dressing Details (indicate cue type and reason): gown Lower Body Dressing: Total assistance;Bed level Lower Body Dressing Details (indicate cue type and reason): R sock and prevalon               General ADL Comments: tolerated sitting EOB x 5 minutes, reports pain limiting     Vision       Perception     Praxis      Cognition Arousal/Alertness: Awake/alert Behavior During Therapy: WFL for tasks assessed/performed Overall Cognitive Status: No family/caregiver present to determine baseline cognitive functioning Area of Impairment: Problem solving;Following commands                       Following Commands: Follows one step commands with increased time Safety/Judgement: Decreased awareness of safety;Decreased awareness of deficits   Problem Solving: Slow processing;Difficulty sequencing;Requires verbal cues General Comments: pt internally distracted, very talkative        Exercises     Shoulder Instructions       General Comments      Pertinent Vitals/ Pain       Pain Assessment: Faces Faces Pain Scale: Hurts even more Pain Location:  back where new vac was placed Pain Descriptors / Indicators: Grimacing;Guarding Pain Intervention(s): Monitored during session;Repositioned  Home Living                                          Prior Functioning/Environment              Frequency  Min 2X/week        Progress Toward Goals  OT Goals(current goals can now be found in the care plan section)  Progress towards OT goals: Progressing toward goals  Acute Rehab OT Goals Patient Stated Goal: to get stronger OT Goal Formulation: With patient Time For Goal Achievement: 01/22/18 Potential to Achieve Goals: Good   Plan Discharge plan remains appropriate    Co-evaluation                 AM-PAC PT "6 Clicks" Daily Activity     Outcome Measure   Help from another person eating meals?: None Help from another person taking care of personal grooming?: A Little   Help from another person bathing (including washing, rinsing, drying)?: A Lot Help from another person to put on and taking off regular upper body clothing?: A Little Help from another person to put on and taking off regular lower body clothing?: Total 6 Click Score: 13    End of Session    OT Visit Diagnosis: Other abnormalities of gait and mobility (R26.89);Pain Pain - Right/Left: Right   Activity Tolerance Patient limited by pain   Patient Left in bed;with call bell/phone within reach   Nurse Communication          Time: [id number] OT Time Calculation (min): 32 min  Charges: OT General Charges $OT Visit: 1 Visit OT Treatments $Self Care/Home Management : 8-22 mins $Therapeutic Activity: 8-22 mins  01/10/2018 Martie RoundJulie Meline Russaw, OTR/L Pager: [phone]   Iran PlanasMayberry, [place] BailiffJulie Lynn 01/10/2018, 2:55 PM

## 2018-01-10 NOTE — Care Management Note (Signed)
Case Management Note  Patient Details  Name: Jory SimsDavid Vane MRN: [id number] Date of Birth: Jan 15, 1961  Subjective/Objective:                    Action/Plan:  NCM and SW discussed discharging planning with patient at bedside. Patient prefers SNF for short term rehab at bedside. Patient was provided his SNF offers .   Home health was also discussed. Patient has decided at discharge to go to SNF. SW will start insurance authorization. Expected Discharge Date:                  Expected Discharge Plan:  Skilled Nursing Facility  In-House Referral:  Clinical Social Work  Discharge planning Services  CM Consult  Post Acute Care Choice:    Choice offered to:  Patient  DME Arranged:    DME Agency:     HH Arranged:    HH Agency:     Status of Service:  Completed, signed off  If discussed at [employer] of [employer] Meetings, dates discussed:    Additional Comments:  Kingsley PlanWile, Lismary Kiehn Marie, RN 01/10/2018, 10:37 AM

## 2018-01-10 NOTE — Progress Notes (Signed)
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 Further work-up showed MRSA bacteremia, likely source his sacral ulcer. Pt was also found to have LLE gangrene, CT arteriogram showed severe PVD, and was subsequently transferred to [facility] for AKA by Dr Lajoyce Corners. General surgery was also consulted for his multiple ulcers that may need debridement. Pt admitted for further management.   Assessment & Plan:   Principal Problem:   Atherosclerosis of native artery of left lower extremity with gangrene (HCC) Active Problems:   Left buttock abscess   Stage III pressure ulcer of buttock (HCC)   MRSA bacteremia   Antibiotic-associated diarrhea   Proteus infection   Acute lower UTI   Pressure ulcer of left upper back, stage 3 (HCC)   Hypokalemia   H/O degenerative disc disease   Chronic narcotic dependence (HCC)   Disuse atrophy of muscle   Tobacco abuse counseling   Right middle lobe pulmonary nodule   Chronic diastolic CHF (congestive heart failure) (HCC)   Non-healing ulcer of groin with fat layer exposed (HCC)   Gangrene of left foot (HCC)   Severe protein-calorie malnutrition (HCC)   Bacteremia   Pressure injury of skin of sacral region   DDD (degenerative disc disease), lumbosacral   Chronic pain syndrome   Leukocytosis   Acute blood loss anemia  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  Arteriogram studies shows complete occlusion of the popliteal fossa on the  left Aspirin, Lipitor.   Sacral Decubitus;  S/P debridement on 01/07/18 Surgery following.  Wound vac.   MRSA Bacteremia;  Required IV pressors on admission at [place].  BC at [place] grew MRSA>  Repeated Blood culture;   TTE and TEE done at [facility] are both unremarkable Case was discussed by Dr Sharolyn [name] with Dr Orvan Falconer, and patient will need 3 Weeks of IV antibiotics. (Vancomycin)   Has Right IJ lumen tunneled placed by IR   WBC increase, follow trend.   Hypotension;  Overnight. IV fluids.  Lactic acid normal.  Cortisol low normal, ACTH cortisol 30 -60 minutes at 12--18 On midodrine.   Anemia; hb trending down.  anemia panel. Iron deficiency.  Stool occult ordered and is negative.  Continue with oral ferrous  sulfate.   Hypoglycemia;  cortisol. Low in am.  Amp d 50   proteus UTI; treated with keflex.   Chronic diastolic HF, stable.   Malnutrition.  Ensure.   Chronic pain syndrome/narcotic dependence Continue pain management with home fentanyl patch    DVT prophylaxis: heparin  Code Status: full code.  Family Communication: care discussed with patient.  Disposition Plan: needs SNF  Consultants:   Dr Lajoyce Corners   Procedures:    Antimicrobials:   Vancomycin 8-24   Subjective: Had BM Mild pain decubitus wound.   Objective: Vitals:   01/09/18 0838 01/09/18 2045 01/10/18 0456 01/10/18 1413  BP: 100/70 (!) 86/76 93/61 (!) 85/63  Pulse:   100 (!) 108  Resp:  16 16 16  Temp:  98.8 F (37.1 C) 99.5 F (37.5 C) 97.7 F (36.5 C)  TempSrc:  Oral Oral Oral  SpO2:  98% 96% 99%  Weight:      Height:        Intake/Output Summary (Last 24 hours) at 01/10/2018 1431 Last data filed at 01/10/2018 1414 Gross per 24 hour  Intake 4419.26 ml  Output 2540 ml  Net 1879.26 ml   Filed Weights   01/05/18 1305 01/06/18 0418 01/07/18 1126  Weight: 73.6 kg 60.9 kg 60.9 kg    Examination:  General exam: thin appearing  Respiratory system: CTA Cardiovascular  system: S 1, S 2 RRR Gastrointestinal system: BS present, soft, nt Central nervous system: alert and oriented Extremities: Left LE with wound vac.  Skin: decubitus ulcer.    Data Reviewed: I have personally reviewed following labs and imaging studies  CBC: Recent Labs  Lab 01/06/18 0337 01/07/18 0325 01/08/18 0453 01/09/18 0505 01/10/18 0450  WBC 13.4* 13.6* 12.9* 10.0 14.8*  NEUTROABS  --  9.5* 10.6* 6.6 10.6*  HGB 9.9* 8.5* 8.1* 7.8* 7.9*  HCT 31.7* 27.6* 26.6* 25.9* 26.2*  MCV 90.1 92.0 92.0 93.5 94.2  PLT 276 319 335 373 423*   Basic Metabolic Panel: Recent Labs  Lab 01/06/18 0337 01/07/18 0325 01/08/18 0453 01/09/18 0505 01/10/18 0450  NA 141 140 139 143 140  K 3.5 3.9 4.2 3.9 3.8  CL 113* 111 109 109 107  CO2 24 25 27 31 28   GLUCOSE 72 76 115* 105* 110*  BUN <5* <5* 5* 10 11  CREATININE 0.54* 0.50* 0.61 0.63 0.48*  CALCIUM 6.8* 6.7* 7.2* 7.1* 7.3*  MG  --  2.3  --   --   --   PHOS 2.3*  --   --   --   --    GFR: Estimated Creatinine Clearance: 89.9 mL/min (A) (by C-G formula based on SCr of 0.48 mg/dL (L)). Liver Function Tests: No results for input(s): AST, ALT, ALKPHOS, BILITOT, PROT, ALBUMIN in the last 168 hours. No results for input(s): LIPASE, AMYLASE in the last 168 hours. No results for input(s): AMMONIA in the last 168 hours. Coagulation Profile: No results for input(s): INR, PROTIME in the last 168 hours. Cardiac Enzymes: No results for input(s): CKTOTAL, CKMB, CKMBINDEX, TROPONINI in the last 168 hours. BNP (last 3 results) No results for input(s): PROBNP in the last 8760 hours. HbA1C: No results for input(s): HGBA1C in the last 72 hours. CBG: Recent Labs  Lab 01/08/18 0756 01/09/18 0752 01/09/18 0828 01/09/18 0913 01/10/18 0754  GLUCAP 111* 57* 60* 111* 109*   Lipid Profile: No results for input(s): CHOL, HDL, LDLCALC, TRIG, CHOLHDL, LDLDIRECT in the last 72 hours. Thyroid Function Tests: No results for input(s): TSH, T4TOTAL,  FREET4, T3FREE, THYROIDAB in the last 72 hours. Anemia Panel: Recent Labs    01/09/18 0950  VITAMINB12 373  FOLATE 7.0  FERRITIN 172  TIBC 111*  IRON 44*  RETICCTPCT 2.9   Sepsis Labs: Recent Labs  Lab 01/09/18 0950  LATICACIDVEN 1.5    Recent Results (from the past 240 hour(s))  Culture, blood (routine x 2)     Status: None (Preliminary result)   Collection Time: 01/06/18 12:26 PM  Result Value Ref Range Status   Specimen Description BLOOD LEFT ANTECUBITAL  Final   Special Requests   Final    BOTTLES DRAWN AEROBIC AND ANAEROBIC Blood Culture adequate volume   Culture   Final    NO GROWTH 4  DAYS Performed at [facility], [address] [place], [place] [address]    Report Status PENDING  Incomplete  Culture, blood (routine x 2)     Status: None (Preliminary result)   Collection Time: 01/06/18 12:26 PM  Result Value Ref Range Status   Specimen Description BLOOD RIGHT ANTECUBITAL  Final   Special Requests   Final    BOTTLES DRAWN AEROBIC AND ANAEROBIC Blood Culture adequate volume   Culture   Final    NO GROWTH 4 DAYS Performed at [facility], [address] [place], [place] [address]    Report Status PENDING  Incomplete  Aerobic/Anaerobic Culture (surgical/deep wound)     Status: None (Preliminary result)   Collection Time: 01/07/18  1:25 PM  Result Value Ref Range Status   Specimen Description ABSCESS  Final   Special Requests SACRAL DECUBITUS ULCER  Final   Gram Stain   Final    ABUNDANT WBC PRESENT, PREDOMINANTLY PMN FEW GRAM POSITIVE COCCI Performed at [facility], [address] [place], [place] [address]    Culture   Final    MODERATE METHICILLIN RESISTANT STAPHYLOCOCCUS AUREUS NO ANAEROBES ISOLATED; CULTURE IN PROGRESS FOR 5 DAYS    Report Status PENDING  Incomplete   Organism ID, Bacteria METHICILLIN RESISTANT STAPHYLOCOCCUS AUREUS  Final      Susceptibility   Methicillin resistant staphylococcus aureus - MIC*    CIPROFLOXACIN  >=8 RESISTANT Resistant     ERYTHROMYCIN >=8 RESISTANT Resistant     GENTAMICIN <=0.5 SENSITIVE Sensitive     OXACILLIN >=4 RESISTANT Resistant     TETRACYCLINE <=1 SENSITIVE Sensitive     VANCOMYCIN <=0.5 SENSITIVE Sensitive     TRIMETH/SULFA <=10 SENSITIVE Sensitive     CLINDAMYCIN <=0.25 SENSITIVE Sensitive     RIFAMPIN <=0.5 SENSITIVE Sensitive     Inducible Clindamycin NEGATIVE Sensitive     * MODERATE METHICILLIN RESISTANT STAPHYLOCOCCUS AUREUS         Radiology Studies: No results found.      Scheduled Meds: . aspirin EC  81 mg Oral Daily  . atorvastatin  40 mg Oral q1800  . docusate sodium  100 mg Oral BID  . feeding supplement (ENSURE ENLIVE)  237 mL Oral TID BM  . feeding supplement (PRO-STAT SUGAR FREE 64)  30 mL Oral BID  . fentaNYL  75 mcg Transdermal Q72H  . ferrous sulfate  325 mg Oral BID WC  . midodrine  5 mg Oral TID WC  . multivitamin with minerals  1 tablet Oral Daily  . polyethylene glycol  17 g Oral BID   Continuous Infusions: . sodium chloride Stopped (01/08/18 1625)  . dextrose 5 % and 0.45% NaCl 75 mL/hr at 01/10/18 1044  . methocarbamol (ROBAXIN) IV    . vancomycin 1,000 mg (01/10/18 1043)     LOS: 5 days    Time spent: 35 minutes.     Alba CoryBelkys A Rozell Theiler, MD [facility] Pager [phone]  If 7PM-7AM, please contact night-coverage [email or web address] Password [email or web address] 01/10/2018, 2:31 PM

## 2018-01-11 LAB — CULTURE, BLOOD (ROUTINE X 2)
Culture: NO GROWTH
Culture: NO GROWTH
SPECIAL REQUESTS: ADEQUATE
SPECIAL REQUESTS: ADEQUATE

## 2018-01-11 LAB — CBC WITH DIFFERENTIAL/PLATELET
Abs Immature Granulocytes: 0.2 10*3/uL — ABNORMAL HIGH (ref 0.0–0.1)
Basophils Absolute: 0.1 10*3/uL (ref 0.0–0.1)
Basophils Relative: 1 %
EOS ABS: 0.7 10*3/uL (ref 0.0–0.7)
EOS PCT: 6 %
HEMATOCRIT: 25.5 % — AB (ref 39.0–52.0)
Hemoglobin: 7.7 g/dL — ABNORMAL LOW (ref 13.0–17.0)
Immature Granulocytes: 1 %
LYMPHS ABS: 2.3 10*3/uL (ref 0.7–4.0)
Lymphocytes Relative: 18 %
MCH: 28.7 pg (ref 26.0–34.0)
MCHC: 30.2 g/dL (ref 30.0–36.0)
MCV: 95.1 fL (ref 78.0–100.0)
MONO ABS: 0.8 10*3/uL (ref 0.1–1.0)
MONOS PCT: 6 %
Neutro Abs: 9.2 10*3/uL — ABNORMAL HIGH (ref 1.7–7.7)
Neutrophils Relative %: 70 %
Platelets: 387 10*3/uL (ref 150–400)
RBC: 2.68 MIL/uL — ABNORMAL LOW (ref 4.22–5.81)
RDW: 17.4 % — AB (ref 11.5–15.5)
WBC: 13.3 10*3/uL — AB (ref 4.0–10.5)

## 2018-01-11 LAB — PREPARE RBC (CROSSMATCH)

## 2018-01-11 LAB — VANCOMYCIN, TROUGH: Vancomycin Tr: 17 ug/mL (ref 15–20)

## 2018-01-11 LAB — BASIC METABOLIC PANEL
Anion gap: 3 — ABNORMAL LOW (ref 5–15)
BUN: 12 mg/dL (ref 6–20)
CALCIUM: 7.6 mg/dL — AB (ref 8.9–10.3)
CO2: 28 mmol/L (ref 22–32)
CREATININE: 0.58 mg/dL — AB (ref 0.61–1.24)
Chloride: 109 mmol/L (ref 98–111)
GFR calc Af Amer: >60 mL/min (ref >60)
GFR calc non Af Amer: >60 mL/min (ref >60)
GLUCOSE: 91 mg/dL (ref 70–99)
Potassium: 3.3 mmol/L — ABNORMAL LOW (ref 3.5–5.1)
Sodium: 140 mmol/L (ref 135–145)

## 2018-01-11 LAB — GLUCOSE, CAPILLARY: GLUCOSE-CAPILLARY: 91 mg/dL (ref 70–99)

## 2018-01-11 MED ORDER — POTASSIUM CHLORIDE CRYS ER 20 MEQ PO TBCR
40.0000 meq | EXTENDED_RELEASE_TABLET | Freq: Once | ORAL | Status: AC
  Administered 2018-01-11: 40 meq via ORAL
  Filled 2018-01-11: qty 2

## 2018-01-11 MED ORDER — SODIUM CHLORIDE 0.9% IV SOLUTION
Freq: Once | INTRAVENOUS | Status: AC
  Administered 2018-01-11: 1 mL via INTRAVENOUS

## 2018-01-11 NOTE — Progress Notes (Signed)
PROGRESS NOTE    Kevin SimsDavid Whitney  UJW:[id number] DOB: Nov 20, 1960 DOA: 01/05/2018 PCP: [name], James, MD    Brief Narrative: Kevin OrleansDavid Bowmanis a 57 y.o.malewith medical history significant for PVD, tobacco abuse, degenerative disc disease of the spine status post operation, disuse atrophy of his muscles, history of MRSA of the spine and chronic narcotic dependence (on fentanyl patches) presented [facility] ED [facility] EMS due to dysuria and pressure ulcer of the left upper back which is chronic but developing a left lower back and sacral ulcer. Further work-up showed MRSA bacteremia, likely source his sacral ulcer. Pt was also found to have LLE gangrene, CT arteriogram showed severe PVD, and was [facility] by Dr Lajoyce Cornersuda. General surgery was also consulted for his multiple ulcers that may need debridement. Pt admitted for further management.   Assessment & Plan:   Principal Problem:   MRSA bacteremia Active Problems:   Left buttock abscess   Stage III pressure ulcer of buttock (HCC)   Atherosclerosis of native artery of left lower extremity with gangrene (HCC)   Antibiotic-associated diarrhea   Proteus infection   Acute lower UTI   Pressure ulcer of left upper back, stage 3 (HCC)   Hypokalemia   H/O degenerative disc disease   Chronic narcotic dependence (HCC)   Disuse atrophy of muscle   Tobacco abuse counseling   Right middle lobe pulmonary nodule   Chronic diastolic CHF (congestive heart failure) (HCC)   Non-healing ulcer of groin with fat layer exposed (HCC)   Gangrene of left foot (HCC)   Severe protein-calorie malnutrition (HCC)   Bacteremia   Pressure injury of skin of sacral region   DDD (degenerative disc disease), lumbosacral   Chronic pain syndrome   Leukocytosis   Acute blood loss anemia  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  Arteriogram studies shows complete occlusion of the popliteal fossa on the  left Aspirin, Lipitor.   Sacral Decubitus;  S/P debridement on 01/07/18 Surgery following.  Wound vac.   MRSA Bacteremia;  Required IV pressors on admission at [place].  BC at [place] grew MRSA>  Repeated Blood culture;   TTE [facility] are both unremarkable Case was discussed by Dr Sharolyn DouglasEzenduka with Dr Orvan Falconerampbell, and patient will need 3 Weeks of IV antibiotics. (Vancomycin)   Has Right IJ lumen tunneled placed by IR   WBC stable  Hypotension;  Overnight. IV fluids.  Lactic acid normal.  Cortisol low normal, ACTH cortisol 30 -60 minutes at 12--18. Discussed with endocrinologist, who recommend repeating test out patient. No need to start hydrocortisone at this time  On midodrine.  Will transfuse one unit PRBC>   Anemia; hb trending down.  anemia panel. Iron deficiency.  Stool occult ordered and is negative.  Continue with oral ferrous  sulfate.  Will transfuse one unit due to hypotension.   Hypoglycemia;  cortisol. Low in am.  Amp d 50  Resolved.   proteus UTI; treated with keflex.   Chronic diastolic HF, stable.   Malnutrition.  Ensure.   Chronic pain syndrome/narcotic dependence Continue pain management with home fentanyl patch    DVT prophylaxis: heparin  Code Status: full code.  Family Communication: care discussed with patient.  Disposition Plan: needs SNF  Consultants:   Dr Lajoyce Cornersuda   Procedures:    Antimicrobials:   Vancomycin 8-24   Subjective: Complaining of pain, leg and buttocks.   Objective: Vitals:  01/11/18 1141 01/11/18 1300 01/11/18 1515 01/11/18 1545  BP: 98/83 (!) 85/59 102/65 102/65  Pulse: (!) 126 (!) 113 (!) 113 (!) 113  Resp: 17 18 18 18   Temp: 97.7 F (36.5 C) 98.4 F (36.9 C) 97.9 F (36.6 C) 97.9 F (36.6 C)  TempSrc: Oral Oral Oral Oral  SpO2: 96% 97%  96%  Weight:      Height:        Intake/Output Summary (Last 24 hours) at 01/11/2018 1559 Last data filed at 01/11/2018 1545 Gross per 24 hour  Intake  2076.91 ml  Output 3006 ml  Net -929.09 ml   Filed Weights   01/05/18 1305 01/06/18 0418 01/07/18 1126  Weight: 73.6 kg 60.9 kg 60.9 kg    Examination:  General exam: NAD Respiratory system: CTA Cardiovascular system: S 1, S 2 RRR Gastrointestinal system: BS present, soft, nt Central nervous system: alert, oriented Extremities: Left Le with wound vac/  Skin: decubitus ulcer.    Data Reviewed: I have personally reviewed following labs and imaging studies  CBC: Recent Labs  Lab 01/07/18 0325 01/08/18 0453 01/09/18 0505 01/10/18 0450 01/11/18 0407  WBC 13.6* 12.9* 10.0 14.8* 13.3*  NEUTROABS 9.5* 10.6* 6.6 10.6* 9.2*  HGB 8.5* 8.1* 7.8* 7.9* 7.7*  HCT 27.6* 26.6* 25.9* 26.2* 25.5*  MCV 92.0 92.0 93.5 94.2 95.1  PLT 319 335 373 423* 387   Basic Metabolic Panel: Recent Labs  Lab 01/06/18 0337 01/07/18 0325 01/08/18 0453 01/09/18 0505 01/10/18 0450 01/11/18 0407  NA 141 140 139 143 140 140  K 3.5 3.9 4.2 3.9 3.8 3.3*  CL 113* 111 109 109 107 109  CO2 24 25 27 31 28 28   GLUCOSE 72 76 115* 105* 110* 91  BUN <5* <5* 5* 10 11 12   CREATININE 0.54* 0.50* 0.61 0.63 0.48* 0.58*  CALCIUM 6.8* 6.7* 7.2* 7.1* 7.3* 7.6*  MG  --  2.3  --   --   --   --   PHOS 2.3*  --   --   --   --   --    GFR: Estimated Creatinine Clearance: 89.9 mL/min (A) (by C-G formula based on SCr of 0.58 mg/dL (L)). Liver Function Tests: No results for input(s): AST, ALT, ALKPHOS, BILITOT, PROT, ALBUMIN in the last 168 hours. No results for input(s): LIPASE, AMYLASE in the last 168 hours. No results for input(s): AMMONIA in the last 168 hours. Coagulation Profile: No results for input(s): INR, PROTIME in the last 168 hours. Cardiac Enzymes: No results for input(s): CKTOTAL, CKMB, CKMBINDEX, TROPONINI in the last 168 hours. BNP (last 3 results) No results for input(s): PROBNP in the last 8760 hours. HbA1C: No results for input(s): HGBA1C in the last 72 hours. CBG: Recent Labs  Lab  01/09/18 0752 01/09/18 0828 01/09/18 0913 01/10/18 0754 01/11/18 0831  GLUCAP 57* 60* 111* 109* 91   Lipid Profile: No results for input(s): CHOL, HDL, LDLCALC, TRIG, CHOLHDL, LDLDIRECT in the last 72 hours. Thyroid Function Tests: No results for input(s): TSH, T4TOTAL, FREET4, T3FREE, THYROIDAB in the last 72 hours. Anemia Panel: Recent Labs    01/09/18 0950  VITAMINB12 373  FOLATE 7.0  FERRITIN 172  TIBC 111*  IRON 44*  RETICCTPCT 2.9   Sepsis Labs: Recent Labs  Lab 01/09/18 0950  LATICACIDVEN 1.5    Recent Results (from the past 240 hour(s))  Culture, blood (routine x 2)     Status: None   Collection Time: 01/06/18 12:26 PM  Result  Value Ref Range Status   Specimen Description BLOOD LEFT ANTECUBITAL  Final   Special Requests   Final    BOTTLES DRAWN AEROBIC AND ANAEROBIC Blood Culture adequate volume   Culture   Final    NO GROWTH 5 DAYS Performed at [facility], [address] [place], [place] [address]    Report Status 01/11/2018 FINAL  Final  Culture, blood (routine x 2)     Status: None   Collection Time: 01/06/18 12:26 PM  Result Value Ref Range Status   Specimen Description BLOOD RIGHT ANTECUBITAL  Final   Special Requests   Final    BOTTLES DRAWN AEROBIC AND ANAEROBIC Blood Culture adequate volume   Culture   Final    NO GROWTH 5 DAYS Performed at [facility], [address], [place], [place] [address]    Report Status 01/11/2018 FINAL  Final  Aerobic/Anaerobic Culture (surgical/deep wound)     Status: None (Preliminary result)   Collection Time: 01/07/18  1:25 PM  Result Value Ref Range Status   Specimen Description ABSCESS  Final   Special Requests SACRAL DECUBITUS ULCER  Final   Gram Stain   Final    ABUNDANT WBC PRESENT, PREDOMINANTLY PMN FEW GRAM POSITIVE COCCI Performed at [facility], [address], [place], [place] [address]    Culture   Final    MODERATE METHICILLIN RESISTANT STAPHYLOCOCCUS AUREUS NO ANAEROBES  ISOLATED; CULTURE IN PROGRESS FOR 5 DAYS    Report Status PENDING  Incomplete   Organism ID, Bacteria METHICILLIN RESISTANT STAPHYLOCOCCUS AUREUS  Final      Susceptibility   Methicillin resistant staphylococcus aureus - MIC*    CIPROFLOXACIN >=8 RESISTANT Resistant     ERYTHROMYCIN >=8 RESISTANT Resistant     GENTAMICIN <=0.5 SENSITIVE Sensitive     OXACILLIN >=4 RESISTANT Resistant     TETRACYCLINE <=1 SENSITIVE Sensitive     VANCOMYCIN <=0.5 SENSITIVE Sensitive     TRIMETH/SULFA <=10 SENSITIVE Sensitive     CLINDAMYCIN <=0.25 SENSITIVE Sensitive     RIFAMPIN <=0.5 SENSITIVE Sensitive     Inducible Clindamycin NEGATIVE Sensitive     * MODERATE METHICILLIN RESISTANT STAPHYLOCOCCUS AUREUS         Radiology Studies: No results found.      Scheduled Meds: . aspirin EC  81 mg Oral Daily  . atorvastatin  40 mg Oral q1800  . docusate sodium  100 mg Oral BID  . feeding supplement (ENSURE ENLIVE)  237 mL Oral TID BM  . feeding supplement (PRO-STAT SUGAR FREE 64)  30 mL Oral BID  . fentaNYL  75 mcg Transdermal Q72H  . ferrous sulfate  325 mg Oral BID WC  . midodrine  5 mg Oral TID WC  . multivitamin with minerals  1 tablet Oral Daily  . polyethylene glycol  17 g Oral BID  . potassium chloride  40 mEq Oral Once   Continuous Infusions: . dextrose 5 % and 0.45% NaCl 75 mL/hr at 01/11/18 0514  . methocarbamol (ROBAXIN) IV    . vancomycin 1,000 mg (01/11/18 1444)     LOS: 6 days    Time spent: 35 minutes.     Alba Cory, MD [facility] Pager [phone]  If 7PM-7AM, please contact night-coverage [email or web address] Password [email or web address] 01/11/2018, 3:59 PM

## 2018-01-11 NOTE — Progress Notes (Signed)
Pt 1 unit of PRBC completed no s/s of blood transfusion reaction noted.

## 2018-01-11 NOTE — Social Work (Signed)
Authorization still pending for pt through [employer], pt unable to discharge until authorization received.   Continuing to follow.   Doy HutchingIsabel H Myrene Bougher, LCSWA [facility] Clinical Social Work [phone]

## 2018-01-11 NOTE — Progress Notes (Signed)
Pt hgb 7.7 for 1 unit of PRBC verified by Z. [place], RN will continue to monitor s/s of blood transfusion reaction.

## 2018-01-11 NOTE — Progress Notes (Signed)
ANTIBIOTIC CONSULT NOTE   Pharmacy Consult for Vancomycin Indication: MRSA wound and bacteremia  No Known Allergies  Patient Measurements: Height: 6\' 1"  (185.4 cm) Weight: 134 lb 4.8 oz (60.9 kg) IBW/kg (Calculated) : 79.9  Vital Signs: Temp: 98.4 F (36.9 C) (08/30 1300) Temp Source: Oral (08/30 1300) BP: 85/59 (08/30 1300) Pulse Rate: 113 (08/30 1300) Intake/Output from previous day: 08/29 0701 - 08/30 0700 In: 2927.3 [P.O.:960; I.V.:1567.3; IV Piggyback:400] Out: 2306 [Urine:2275; Drains:30; Stool:1] Intake/Output from this shift: Total I/O In: -  Out: 950 [Urine:950]  Labs: Recent Labs    01/09/18 0505 01/10/18 0450 01/11/18 0407  WBC 10.0 14.8* 13.3*  HGB 7.8* 7.9* 7.7*  PLT 373 423* 387  CREATININE 0.63 0.48* 0.58*   Estimated Creatinine Clearance: 89.9 mL/min (A) (by C-G formula based on SCr of 0.58 mg/dL (L)). Recent Labs    01/11/18 1030  VANCOTROUGH 17     Microbiology: Recent Results (from the past 720 hour(s))  Culture, blood (routine x 2)     Status: None (Preliminary result)   Collection Time: 01/06/18 12:26 PM  Result Value Ref Range Status   Specimen Description BLOOD LEFT ANTECUBITAL  Final   Special Requests   Final    BOTTLES DRAWN AEROBIC AND ANAEROBIC Blood Culture adequate volume   Culture   Final    NO GROWTH 4 DAYS Performed at [facility], [address] [place], [place] [address]    Report Status PENDING  Incomplete  Culture, blood (routine x 2)     Status: None (Preliminary result)   Collection Time: 01/06/18 12:26 PM  Result Value Ref Range Status   Specimen Description BLOOD RIGHT ANTECUBITAL  Final   Special Requests   Final    BOTTLES DRAWN AEROBIC AND ANAEROBIC Blood Culture adequate volume   Culture   Final    NO GROWTH 4 DAYS Performed at [facility], [address] [place], [place] [address]    Report Status PENDING  Incomplete  Aerobic/Anaerobic Culture (surgical/deep wound)     Status: None  (Preliminary result)   Collection Time: 01/07/18  1:25 PM  Result Value Ref Range Status   Specimen Description ABSCESS  Final   Special Requests SACRAL DECUBITUS ULCER  Final   Gram Stain   Final    ABUNDANT WBC PRESENT, PREDOMINANTLY PMN FEW GRAM POSITIVE COCCI Performed at [facility], [address] [place], [place] [address]    Culture   Final    MODERATE METHICILLIN RESISTANT STAPHYLOCOCCUS AUREUS NO ANAEROBES ISOLATED; CULTURE IN PROGRESS FOR 5 DAYS    Report Status PENDING  Incomplete   Organism ID, Bacteria METHICILLIN RESISTANT STAPHYLOCOCCUS AUREUS  Final      Susceptibility   Methicillin resistant staphylococcus aureus - MIC*    CIPROFLOXACIN >=8 RESISTANT Resistant     ERYTHROMYCIN >=8 RESISTANT Resistant     GENTAMICIN <=0.5 SENSITIVE Sensitive     OXACILLIN >=4 RESISTANT Resistant     TETRACYCLINE <=1 SENSITIVE Sensitive     VANCOMYCIN <=0.5 SENSITIVE Sensitive     TRIMETH/SULFA <=10 SENSITIVE Sensitive     CLINDAMYCIN <=0.25 SENSITIVE Sensitive     RIFAMPIN <=0.5 SENSITIVE Sensitive     Inducible Clindamycin NEGATIVE Sensitive     * MODERATE METHICILLIN RESISTANT STAPHYLOCOCCUS AUREUS    Medical History: Past Medical History:  Diagnosis Date  . Acute blood loss anemia 12/2017  . Bacteremia   . Broken legs   . CHF (congestive heart failure) (HCC)   .  Chronic pain   . Chronic pain syndrome   . Complication of anesthesia    " i hallucinate "  . DDD (degenerative disc disease), lumbar   . 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 MRSA (methicillin resistant staph aureus) culture positive    2003   . Pressure injury of skin of sacral region   . Protein calorie malnutrition (HCC)   . Staph infection   . UTI (urinary tract infection) 12/2017    Assessment: 57 year old male with MRSA bacteremia and MRSA in sacral wound. TEE negative for IE at [facility]. The vancomycin trough at [facility] was therapeutic (15.7) on 1g IV  every 12 hours.   Repeat vancomycin trough today remains therapeutic at 17 on 1g IV every 12 hours. WBC is stable at 13.3. Patient remains afebrile. SCr is stable.   Cultures:  [facility] 8/20: MRSA bacteremia [facility] 8/20: SacraL wound: MRSA  Goal of Therapy:  Vancomycin trough level 15-20 mcg/ml  Plan:  Continue Vancomycin 1g IV q 12 hrs Monitor renal function, culture results, and clinical status.   Link Snuffer, PharmD, BCPS, BCCCP Clinical Pharmacist Clinical phone 01/11/2018 until 3:30PM [phone] Please refer to [facility] for [facility] Pharmacy numbers 01/11/2018, 1:20 PM

## 2018-01-11 NOTE — Progress Notes (Signed)
Physical Therapy Treatment Patient Details Name: Kevin Whitney MRN: [id number] DOB: 28-Aug-1960 Today's Date: 01/11/2018    History of Present Illness Kevin Whitney is a 57 y.o. 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 Further work-up showed MRSA bacteremia, likely source his sacral ulcer.  Pt was also found to have LLE gangrene, CT arteriogram showed severe PVD, and was subsequently transferred to [facility] for AKA by Dr Lajoyce Corners.    PT Comments    Pt remains limited with functional mobility secondary to weakness and pain. He continues to require physical assistance of two for bed mobility and transfers. Pt would continue to benefit from skilled physical therapy services at this time while admitted and after d/c to address the below listed limitations in order to improve overall safety and independence with functional mobility.    Follow Up Recommendations  SNF     Equipment Recommendations  None recommended by PT    Recommendations for Other Services       Precautions / Restrictions Precautions Precautions: Fall Precaution Comments: wound VAC x2 Restrictions Weight Bearing Restrictions: Yes LLE Weight Bearing: Non weight bearing    Mobility  Bed Mobility Overal bed mobility: Needs Assistance Bed Mobility: Supine to Sit     Supine to sit: Min assist;+2 for physical assistance     General bed mobility comments: increased time and effort, assistance with bilateral LE management  Transfers Overall transfer level: Needs assistance Equipment used: None Transfers: Licensed conveyancer  transfers: Mod assist;+2 physical assistance   General transfer comment: assist with use of bed pads; pt impulsive and with difficulty problem solving despite cueing for technique  Ambulation/Gait                 Stairs             Wheelchair Mobility    Modified Rankin (Stroke Patients Only)       Balance Overall balance assessment: Needs assistance Sitting-balance support: Bilateral upper extremity supported;Feet unsupported Sitting balance-Leahy Scale: Poor Sitting balance - Comments: Relies on BUE support and external support                                    Cognition Arousal/Alertness: Awake/alert Behavior During Therapy: Impulsive Overall Cognitive Status: Impaired/Different from baseline Area of Impairment: Problem solving;Following commands;Safety/judgement                       Following Commands: Follows one step commands with increased time Safety/Judgement: Decreased awareness of safety;Decreased awareness of deficits   Problem Solving: Slow processing;Difficulty sequencing;Requires verbal cues        Exercises      General Comments        Pertinent Vitals/Pain Pain Assessment: Faces Faces Pain Scale: Hurts even more Pain Location: L residual limb with transfer Pain Descriptors / Indicators: Grimacing;Guarding Pain Intervention(s): Monitored during session;Repositioned    Home Living                      Prior  Function            PT Goals (current goals can now be found in the care plan section) Acute Rehab PT Goals PT Goal Formulation: With patient Time For Goal Achievement: 01/20/18 Potential to Achieve Goals: Good Progress towards PT goals: Progressing toward goals    Frequency    Min 3X/week      PT Plan Current plan remains appropriate    Co-evaluation              AM-PAC PT "6 Clicks" Daily Activity  Outcome Measure  Difficulty turning over in bed (including adjusting  bedclothes, sheets and blankets)?: A Lot Difficulty moving from lying on back to sitting on the side of the bed? : Unable Difficulty sitting down on and standing up from a chair with arms (e.g., wheelchair, bedside commode, etc,.)?: Unable Help needed moving to and from a bed to chair (including a wheelchair)?: A Lot Help needed walking in hospital room?: Total Help needed climbing 3-5 steps with a railing? : Total 6 Click Score: 8    End of Session   Activity Tolerance: Patient limited by pain Patient left: in chair;with call bell/phone within reach Nurse Communication: Mobility status PT Visit Diagnosis: Muscle weakness (generalized) (M62.81);Other abnormalities of gait and mobility (R26.89);Pain Pain - Right/Left: Left Pain - part of body: Leg     Time: [id number] PT Time Calculation (min) (ACUTE ONLY): 23 min  Charges:  $Therapeutic Activity: 23-37 mins                     Deborah ChalkJennifer Anicia Leuthold, PT, DPT  Acute Rehabilitation Services Pager [phone] Office [phone]     Alessandra BevelsJennifer M Sharonica Kraszewski 01/11/2018, 12:22 PM

## 2018-01-12 LAB — CBC WITH DIFFERENTIAL/PLATELET
Abs Immature Granulocytes: 0.1 10*3/uL (ref 0.0–0.1)
Basophils Absolute: 0.1 10*3/uL (ref 0.0–0.1)
Basophils Relative: 1 %
Eosinophils Absolute: 1 10*3/uL — ABNORMAL HIGH (ref 0.0–0.7)
Eosinophils Relative: 7 %
HEMATOCRIT: 31 % — AB (ref 39.0–52.0)
Hemoglobin: 9.5 g/dL — ABNORMAL LOW (ref 13.0–17.0)
IMMATURE GRANULOCYTES: 1 %
LYMPHS ABS: 2.3 10*3/uL (ref 0.7–4.0)
Lymphocytes Relative: 16 %
MCH: 28.9 pg (ref 26.0–34.0)
MCHC: 30.6 g/dL (ref 30.0–36.0)
MCV: 94.2 fL (ref 78.0–100.0)
MONOS PCT: 8 %
Monocytes Absolute: 1.1 10*3/uL — ABNORMAL HIGH (ref 0.1–1.0)
NEUTROS ABS: 9.9 10*3/uL — AB (ref 1.7–7.7)
NEUTROS PCT: 67 %
Platelets: 397 10*3/uL (ref 150–400)
RBC: 3.29 MIL/uL — ABNORMAL LOW (ref 4.22–5.81)
RDW: 17.4 % — ABNORMAL HIGH (ref 11.5–15.5)
WBC: 14.6 10*3/uL — ABNORMAL HIGH (ref 4.0–10.5)

## 2018-01-12 LAB — TYPE AND SCREEN
ABO/RH(D): A POS
Antibody Screen: NEGATIVE
Unit division: 0

## 2018-01-12 LAB — BPAM RBC
BLOOD PRODUCT EXPIRATION DATE: 201909062359
ISSUE DATE / TIME: 201908301132
Unit Type and Rh: 600

## 2018-01-12 LAB — AEROBIC/ANAEROBIC CULTURE (SURGICAL/DEEP WOUND)

## 2018-01-12 LAB — AEROBIC/ANAEROBIC CULTURE W GRAM STAIN (SURGICAL/DEEP WOUND)

## 2018-01-12 LAB — GLUCOSE, CAPILLARY: Glucose-Capillary: 86 mg/dL (ref 70–99)

## 2018-01-12 NOTE — Progress Notes (Signed)
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 Further work-up showed MRSA bacteremia, likely source his sacral ulcer. Pt was also found to have LLE gangrene, CT arteriogram showed severe PVD, and was subsequently transferred to [facility] for AKA by Dr Lajoyce Cornersuda. General surgery was also consulted for his multiple ulcers that may need debridement. Pt admitted for further management.   Assessment & Plan:   Principal Problem:   MRSA bacteremia Active Problems:   Left buttock abscess   Stage III pressure ulcer of buttock (HCC)   Atherosclerosis of native artery of left lower extremity with gangrene (HCC)   Antibiotic-associated diarrhea   Proteus infection   Acute lower UTI   Pressure ulcer of left upper back, stage 3 (HCC)   Hypokalemia   H/O degenerative disc disease   Chronic narcotic dependence (HCC)   Disuse atrophy of muscle   Tobacco abuse counseling   Right middle lobe pulmonary nodule   Chronic diastolic CHF (congestive heart failure) (HCC)   Non-healing ulcer of groin with fat layer exposed (HCC)   Gangrene of left foot (HCC)   Severe protein-calorie malnutrition (HCC)   Bacteremia   Pressure injury of skin of sacral region   DDD (degenerative disc disease), lumbosacral   Chronic pain syndrome   Leukocytosis   Acute blood loss anemia  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  Arteriogram studies shows complete occlusion of the popliteal fossa on the  left Aspirin, Lipitor.   Sacral Decubitus;  S/P debridement on 01/07/18 Surgery following.  Wound vac.   MRSA Bacteremia;  Required IV pressors on admission at [facility].  BC at [place] grew MRSA>  Repeated Blood culture;   TTE [facility] are both unremarkable Case was discussed by Dr Sharolyn DouglasEzenduka with Dr Orvan Falconerampbell, and patient will need 3 Weeks of IV antibiotics. (Vancomycin)   Has Right IJ lumen tunneled placed by IR   Follow WBC.    Hypotension;  Overnight. IV fluids.  Lactic acid normal.  Cortisol low normal, ACTH cortisol 30 -60 minutes at 12--18. Discussed with endocrinologist, who recommend repeating test out patient. No need to start hydrocortisone at this time  On midodrine.  Received one unit PRBC Stable.   Anemia; hb trending down.  anemia panel. Iron deficiency.  Stool occult ordered and is negative.  Continue with oral ferrous  sulfate.  Improved post transfusion   Hypoglycemia;  cortisol. Low in am.  Amp d 50  Resolved.   proteus UTI; treated with keflex.   Chronic diastolic HF, stable.   Malnutrition.  Ensure.   Chronic pain syndrome/narcotic dependence Continue pain management with home fentanyl patch    DVT prophylaxis: heparin  Code Status: full code.  Family Communication: care discussed with patient.  Disposition Plan: needs SNF  Consultants:   Dr Lajoyce Cornersuda   Procedures:    Antimicrobials:   Vancomycin 8-24   Subjective: No new complaints.  His BP is always low.  He pass out  when his BP is below 55  Objective: Vitals:   01/11/18 1545 01/11/18 2145 01/12/18 0455 01/12/18 1347  BP: 102/65 (!) 103/55 92/73 103/66  Pulse: (!) 113 (!) 106 (!) 101 97  Resp: 18 19 18    Temp: 97.9 F (36.6 C) 98.6 F (37 C) 99 F (37.2 C) 98.2 F (36.8 C)  TempSrc: Oral Oral Oral Tympanic  SpO2: 96% 98% 99% 97%  Weight:      Height:        Intake/Output Summary (Last 24 hours) at 01/12/2018 1821 Last data filed at 01/12/2018  1718 Gross per 24 hour  Intake 4048 ml  Output 3720 ml  Net 328 ml   Filed Weights   01/05/18 1305 01/06/18 0418 01/07/18 1126  Weight: 73.6 kg 60.9 kg 60.9 kg    Examination:  General exam: NAD Respiratory system: CTA Cardiovascular system: S 1, S 2 RRR Gastrointestinal system: BS present, soft, nt Central nervous system: alert.  Extremities: Left LE wound vac Skin: Decubitus ulcer.    Data Reviewed: I have personally reviewed following labs and imaging studies  CBC: Recent Labs  Lab 01/08/18 0453 01/09/18 0505 01/10/18 0450 01/11/18 0407 01/12/18 0357  WBC 12.9* 10.0 14.8* 13.3* 14.6*  NEUTROABS 10.6* 6.6 10.6* 9.2* 9.9*  HGB 8.1* 7.8* 7.9* 7.7* 9.5*  HCT 26.6* 25.9* 26.2* 25.5* 31.0*  MCV 92.0 93.5 94.2 95.1 94.2  PLT 335 373 423* 387 397   Basic Metabolic Panel: Recent Labs  Lab 01/06/18 0337 01/07/18 0325 01/08/18 0453 01/09/18 0505 01/10/18 0450 01/11/18 0407  NA 141 140 139 143 140 140  K 3.5 3.9 4.2 3.9 3.8 3.3*  CL 113* 111 109 109 107 109  CO2 24 25 27 31 28 28   GLUCOSE 72 76 115* 105* 110* 91  BUN <5* <5* 5* 10 11 12   CREATININE 0.54* 0.50* 0.61 0.63 0.48* 0.58*  CALCIUM 6.8* 6.7* 7.2* 7.1* 7.3* 7.6*  MG  --  2.3  --   --   --   --   PHOS 2.3*  --   --   --   --   --    GFR: Estimated Creatinine Clearance: 89.9 mL/min (A) (by C-G formula based on SCr of 0.58 mg/dL (L)). Liver Function Tests: No results for input(s): AST, ALT, ALKPHOS, BILITOT, PROT, ALBUMIN in the last 168 hours. No results for input(s): LIPASE, AMYLASE in the last 168 hours. No results for input(s): AMMONIA in the last 168 hours. Coagulation Profile: No results for input(s): INR, PROTIME in the last 168 hours. Cardiac Enzymes: No results for input(s): CKTOTAL, CKMB, CKMBINDEX, TROPONINI in the last 168 hours. BNP (last 3 results) No results for input(s): PROBNP in the last 8760 hours. HbA1C: No results for input(s): HGBA1C in the last 72 hours. CBG: Recent Labs   Lab 01/09/18 0828 01/09/18 0913 01/10/18 0754 01/11/18 0831 01/12/18 0742  GLUCAP 60* 111* 109* 91 86   Lipid Profile: No results for input(s): CHOL, HDL, LDLCALC, TRIG, CHOLHDL, LDLDIRECT in the last 72 hours. Thyroid Function Tests: No results for input(s): TSH, T4TOTAL, FREET4, T3FREE, THYROIDAB in the last 72 hours. Anemia Panel: No results for input(s): VITAMINB12, FOLATE, FERRITIN, TIBC, IRON, RETICCTPCT in the last 72 hours. Sepsis Labs: Recent Labs  Lab 01/09/18 0950  LATICACIDVEN 1.5    Recent Results (from the past 240 hour(s))  Culture, blood (routine x 2)     Status: None   Collection Time: 01/06/18 12:26 PM  Result Value Ref Range Status  Specimen Description BLOOD LEFT ANTECUBITAL  Final   Special Requests   Final    BOTTLES DRAWN AEROBIC AND ANAEROBIC Blood Culture adequate volume   Culture   Final    NO GROWTH 5 DAYS Performed at [facility], [address] [place], [place] [address]    Report Status 01/11/2018 FINAL  Final  Culture, blood (routine x 2)     Status: None   Collection Time: 01/06/18 12:26 PM  Result Value Ref Range Status   Specimen Description BLOOD RIGHT ANTECUBITAL  Final   Special Requests   Final    BOTTLES DRAWN AEROBIC AND ANAEROBIC Blood Culture adequate volume   Culture   Final    NO GROWTH 5 DAYS Performed at [facility], [address], [place], [place] [address]    Report Status 01/11/2018 FINAL  Final  Aerobic/Anaerobic Culture (surgical/deep wound)     Status: None   Collection Time: 01/07/18  1:25 PM  Result Value Ref Range Status   Specimen Description ABSCESS  Final   Special Requests SACRAL DECUBITUS ULCER  Final   Gram Stain   Final    ABUNDANT WBC PRESENT, PREDOMINANTLY PMN FEW GRAM POSITIVE COCCI    Culture   Final    MODERATE METHICILLIN RESISTANT STAPHYLOCOCCUS AUREUS NO ANAEROBES ISOLATED Performed at [facility], [address] [place], [place] [address]    Report Status  01/12/2018 FINAL  Final   Organism ID, Bacteria METHICILLIN RESISTANT STAPHYLOCOCCUS AUREUS  Final      Susceptibility   Methicillin resistant staphylococcus aureus - MIC*    CIPROFLOXACIN >=8 RESISTANT Resistant     ERYTHROMYCIN >=8 RESISTANT Resistant     GENTAMICIN <=0.5 SENSITIVE Sensitive     OXACILLIN >=4 RESISTANT Resistant     TETRACYCLINE <=1 SENSITIVE Sensitive     VANCOMYCIN <=0.5 SENSITIVE Sensitive     TRIMETH/SULFA <=10 SENSITIVE Sensitive     CLINDAMYCIN <=0.25 SENSITIVE Sensitive     RIFAMPIN <=0.5 SENSITIVE Sensitive     Inducible Clindamycin NEGATIVE Sensitive     * MODERATE METHICILLIN RESISTANT STAPHYLOCOCCUS AUREUS         Radiology Studies: No results found.      Scheduled Meds: . aspirin EC  81 mg Oral Daily  . atorvastatin  40 mg Oral q1800  . docusate sodium  100 mg Oral BID  . feeding supplement (ENSURE ENLIVE)  237 mL Oral TID BM  . feeding supplement (PRO-STAT SUGAR FREE 64)  30 mL Oral BID  . fentaNYL  75 mcg Transdermal Q72H  . ferrous sulfate  325 mg Oral BID WC  . midodrine  5 mg Oral TID WC  . multivitamin with minerals  1 tablet Oral Daily  . polyethylene glycol  17 g Oral BID   Continuous Infusions: . dextrose 5 % and 0.45% NaCl 75 mL/hr at 01/12/18 1349  . methocarbamol (ROBAXIN) IV    . vancomycin Stopped (01/12/18 1139)     LOS: 7 days    Time spent: 35 minutes.     Alba Cory, MD [facility] Pager [phone]  If 7PM-7AM, please contact night-coverage [email or web address] Password [facility] 01/12/2018, 6:21 PM

## 2018-01-13 LAB — CBC WITH DIFFERENTIAL/PLATELET
Abs Immature Granulocytes: 0.2 10*3/uL — ABNORMAL HIGH (ref 0.0–0.1)
Basophils Absolute: 0.1 10*3/uL (ref 0.0–0.1)
Basophils Relative: 1 %
Eosinophils Absolute: 1 10*3/uL — ABNORMAL HIGH (ref 0.0–0.7)
Eosinophils Relative: 8 %
HEMATOCRIT: 29.9 % — AB (ref 39.0–52.0)
HEMOGLOBIN: 9.2 g/dL — AB (ref 13.0–17.0)
IMMATURE GRANULOCYTES: 1 %
LYMPHS ABS: 1.8 10*3/uL (ref 0.7–4.0)
LYMPHS PCT: 14 %
MCH: 28.9 pg (ref 26.0–34.0)
MCHC: 30.8 g/dL (ref 30.0–36.0)
MCV: 94 fL (ref 78.0–100.0)
Monocytes Absolute: 0.8 10*3/uL (ref 0.1–1.0)
Monocytes Relative: 6 %
NEUTROS PCT: 70 %
Neutro Abs: 9.1 10*3/uL — ABNORMAL HIGH (ref 1.7–7.7)
Platelets: 357 10*3/uL (ref 150–400)
RBC: 3.18 MIL/uL — AB (ref 4.22–5.81)
RDW: 17.7 % — AB (ref 11.5–15.5)
WBC: 13 10*3/uL — AB (ref 4.0–10.5)

## 2018-01-13 LAB — BASIC METABOLIC PANEL
ANION GAP: 4 — AB (ref 5–15)
BUN: 14 mg/dL (ref 6–20)
CALCIUM: 7.8 mg/dL — AB (ref 8.9–10.3)
CHLORIDE: 105 mmol/L (ref 98–111)
CO2: 29 mmol/L (ref 22–32)
Creatinine, Ser: 0.61 mg/dL (ref 0.61–1.24)
Glucose, Bld: 134 mg/dL — ABNORMAL HIGH (ref 70–99)
Potassium: 3.8 mmol/L (ref 3.5–5.1)
Sodium: 138 mmol/L (ref 135–145)

## 2018-01-13 LAB — GLUCOSE, CAPILLARY: Glucose-Capillary: 79 mg/dL (ref 70–99)

## 2018-01-13 NOTE — Progress Notes (Signed)
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 Further work-up showed MRSA bacteremia, likely source his sacral ulcer. Pt was also found to have LLE gangrene, CT arteriogram showed severe PVD, and was subsequently transferred to [facility] for AKA by Dr Lajoyce Corners. General surgery was also consulted for his multiple ulcers that may need debridement. Pt admitted for further management.   Assessment & Plan:   Principal Problem:   MRSA bacteremia Active Problems:   Left buttock abscess   Stage III pressure ulcer of buttock (HCC)   Atherosclerosis of native artery of left lower extremity with gangrene (HCC)   Antibiotic-associated diarrhea   Proteus infection   Acute lower UTI   Pressure ulcer of left upper back, stage 3 (HCC)   Hypokalemia   H/O degenerative disc disease   Chronic narcotic dependence (HCC)   Disuse atrophy of muscle   Tobacco abuse counseling   Right middle lobe pulmonary nodule   Chronic diastolic CHF (congestive heart failure) (HCC)   Non-healing ulcer of groin with fat layer exposed (HCC)   Gangrene of left foot (HCC)   Severe protein-calorie malnutrition (HCC)   Bacteremia   Pressure injury of skin of sacral region   DDD (degenerative disc disease), lumbosacral   Chronic pain syndrome   Leukocytosis   Acute blood loss anemia  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  Arteriogram studies shows complete occlusion of the popliteal fossa on the  left Aspirin, Lipitor.   Sacral Decubitus;  S/P debridement on 01/07/18 Surgery following.  Wound vac.   MRSA Bacteremia;  Required IV pressors on admission at [place].  BC at [place] grew MRSA>  Repeated Blood culture;   TTE and TEE done at [facility] are both unremarkable Case was discussed by Dr Sharolyn [name] with Dr Orvan Falconer, and patient will need 3 Weeks of IV antibiotics. (Vancomycin)   Has Right IJ lumen tunneled placed by IR   Follow WBC.    Hypotension;  Overnight. IV fluids.  Lactic acid normal.  Cortisol low normal, ACTH cortisol 30 -60 minutes at 12--18. Discussed with endocrinologist, who recommend repeating test out patient. No need to start hydrocortisone at this time  On midodrine.  Received one unit PRBC Stable.   Anemia; hb trending down.  anemia panel. Iron deficiency.  Stool occult ordered and is negative.  Continue with oral ferrous  sulfate.  Improved post transfusion   Hypoglycemia;  cortisol. Low in am.  Amp d 50  Resolved.   proteus UTI; treated with keflex.   Chronic diastolic HF, stable.   Malnutrition.  Ensure.   Chronic pain syndrome/narcotic dependence Continue pain management with home fentanyl patch    DVT prophylaxis: heparin  Code Status: full code.  Family Communication: care discussed with patient.  Disposition Plan: needs SNF. Awaiting bed  Consultants:   Dr Lajoyce Corners   Procedures:    Antimicrobials:   Vancomycin 8-24   Subjective: Complaining of pain buttocks and leg/   Objective: Vitals:  01/12/18 1347 01/12/18 2239 01/13/18 0710 01/13/18 1238  BP: 103/66 100/69 106/74 100/70  Pulse: 97 97 92 (!) 102  Resp:    17  Temp: 98.2 F (36.8 C) 98.4 F (36.9 C) 98.6 F (37 C) 98.4 F (36.9 C)  TempSrc: Tympanic Oral Oral Oral  SpO2: 97% 97% 100% 100%  Weight:      Height:        Intake/Output Summary (Last 24 hours) at 01/13/2018 1319 Last data filed at 01/13/2018 1239 Gross per 24 hour  Intake 3457.63 ml    Output 4075 ml  Net -617.37 ml   Filed Weights   01/05/18 1305 01/06/18 0418 01/07/18 1126  Weight: 73.6 kg 60.9 kg 60.9 kg    Examination:  General exam: NAD Respiratory system: CTA Cardiovascular system: S 1, S 2 RRR Gastrointestinal system: BS present, soft, nt Central nervous system: Alert.  Extremities: Left LE wound vac Skin: Decubitus ulcer.    Data Reviewed: I have personally reviewed following labs and imaging studies  CBC: Recent Labs  Lab 01/09/18 0505 01/10/18 0450 01/11/18 0407 01/12/18 0357 01/13/18 0431  WBC 10.0 14.8* 13.3* 14.6* 13.0*  NEUTROABS 6.6 10.6* 9.2* 9.9* 9.1*  HGB 7.8* 7.9* 7.7* 9.5* 9.2*  HCT 25.9* 26.2* 25.5* 31.0* 29.9*  MCV 93.5 94.2 95.1 94.2 94.0  PLT 373 423* 387 397 357   Basic Metabolic Panel: Recent Labs  Lab 01/07/18 0325 01/08/18 0453 01/09/18 0505 01/10/18 0450 01/11/18 0407 01/13/18 0431  NA 140 139 143 140 140 138  K 3.9 4.2 3.9 3.8 3.3* 3.8  CL 111 109 109 107 109 105  CO2 25 27 31 28 28 29   GLUCOSE 76 115* 105* 110* 91 134*  BUN <5* 5* 10 11 12 14   CREATININE 0.50* 0.61 0.63 0.48* 0.58* 0.61  CALCIUM 6.7* 7.2* 7.1* 7.3* 7.6* 7.8*  MG 2.3  --   --   --   --   --    GFR: Estimated Creatinine Clearance: 89.9 mL/min (by C-G formula based on SCr of 0.61 mg/dL). Liver Function Tests: No results for input(s): AST, ALT, ALKPHOS, BILITOT, PROT, ALBUMIN in the last 168 hours. No results for input(s): LIPASE, AMYLASE in the last 168 hours. No results for input(s): AMMONIA in the last 168 hours. Coagulation Profile: No results for input(s): INR, PROTIME in the last 168 hours. Cardiac Enzymes: No results for input(s): CKTOTAL, CKMB, CKMBINDEX, TROPONINI in the last 168 hours. BNP (last 3 results) No results for input(s): PROBNP in the last 8760 hours. HbA1C: No results for input(s): HGBA1C in the last 72 hours. CBG: Recent Labs  Lab 01/09/18 0913 01/10/18 0754 01/11/18 0831 01/12/18 0742 01/13/18 0803   GLUCAP 111* 109* 91 86 79   Lipid Profile: No results for input(s): CHOL, HDL, LDLCALC, TRIG, CHOLHDL, LDLDIRECT in the last 72 hours. Thyroid Function Tests: No results for input(s): TSH, T4TOTAL, FREET4, T3FREE, THYROIDAB in the last 72 hours. Anemia Panel: No results for input(s): VITAMINB12, FOLATE, FERRITIN, TIBC, IRON, RETICCTPCT in the last 72 hours. Sepsis Labs: Recent Labs  Lab 01/09/18 0950  LATICACIDVEN 1.5    Recent Results (from the past 240 hour(s))  Culture, blood (routine x 2)     Status: None   Collection Time: 01/06/18 12:26 PM  Result Value Ref Range Status   Specimen Description BLOOD LEFT ANTECUBITAL  Final   Special Requests   Final    BOTTLES DRAWN AEROBIC AND ANAEROBIC Blood Culture adequate volume   Culture  Final    NO GROWTH 5 DAYS Performed at [facility], [address] [place], [place] [address]    Report Status 01/11/2018 FINAL  Final  Culture, blood (routine x 2)     Status: None   Collection Time: 01/06/18 12:26 PM  Result Value Ref Range Status   Specimen Description BLOOD RIGHT ANTECUBITAL  Final   Special Requests   Final    BOTTLES DRAWN AEROBIC AND ANAEROBIC Blood Culture adequate volume   Culture   Final    NO GROWTH 5 DAYS Performed at [facility], [address], [place], [place] [address]    Report Status 01/11/2018 FINAL  Final  Aerobic/Anaerobic Culture (surgical/deep wound)     Status: None   Collection Time: 01/07/18  1:25 PM  Result Value Ref Range Status   Specimen Description ABSCESS  Final   Special Requests SACRAL DECUBITUS ULCER  Final   Gram Stain   Final    ABUNDANT WBC PRESENT, PREDOMINANTLY PMN FEW GRAM POSITIVE COCCI    Culture   Final    MODERATE METHICILLIN RESISTANT STAPHYLOCOCCUS AUREUS NO ANAEROBES ISOLATED Performed at [facility], [address], [place], [place] [address]    Report Status 01/12/2018 FINAL  Final   Organism ID, Bacteria METHICILLIN RESISTANT STAPHYLOCOCCUS  AUREUS  Final      Susceptibility   Methicillin resistant staphylococcus aureus - MIC*    CIPROFLOXACIN >=8 RESISTANT Resistant     ERYTHROMYCIN >=8 RESISTANT Resistant     GENTAMICIN <=0.5 SENSITIVE Sensitive     OXACILLIN >=4 RESISTANT Resistant     TETRACYCLINE <=1 SENSITIVE Sensitive     VANCOMYCIN <=0.5 SENSITIVE Sensitive     TRIMETH/SULFA <=10 SENSITIVE Sensitive     CLINDAMYCIN <=0.25 SENSITIVE Sensitive     RIFAMPIN <=0.5 SENSITIVE Sensitive     Inducible Clindamycin NEGATIVE Sensitive     * MODERATE METHICILLIN RESISTANT STAPHYLOCOCCUS AUREUS         Radiology Studies: No results found.      Scheduled Meds: . aspirin EC  81 mg Oral Daily  . atorvastatin  40 mg Oral q1800  . docusate sodium  100 mg Oral BID  . feeding supplement (ENSURE ENLIVE)  237 mL Oral TID BM  . feeding supplement (PRO-STAT SUGAR FREE 64)  30 mL Oral BID  . fentaNYL  75 mcg Transdermal Q72H  . ferrous sulfate  325 mg Oral BID WC  . midodrine  5 mg Oral TID WC  . multivitamin with minerals  1 tablet Oral Daily  . polyethylene glycol  17 g Oral BID   Continuous Infusions: . dextrose 5 % and 0.45% NaCl 75 mL/hr at 01/13/18 0053  . methocarbamol (ROBAXIN) IV    . vancomycin 1,000 mg (01/13/18 1126)     LOS: 8 days    Time spent: 35 minutes.     Alba Cory, MD [facility] Pager [phone]  If 7PM-7AM, please contact night-coverage [email or web address] Password [email or web address] 01/13/2018, 1:19 PM

## 2018-01-13 NOTE — Plan of Care (Signed)
  Problem: Activity: Goal: Risk for activity intolerance will decrease Outcome: Progressing   Problem: Nutrition: Goal: Adequate nutrition will be maintained Outcome: Progressing   

## 2018-01-14 LAB — BASIC METABOLIC PANEL
ANION GAP: 3 — AB (ref 5–15)
BUN: 12 mg/dL (ref 6–20)
CHLORIDE: 105 mmol/L (ref 98–111)
CO2: 29 mmol/L (ref 22–32)
CREATININE: 0.46 mg/dL — AB (ref 0.61–1.24)
Calcium: 7.9 mg/dL — ABNORMAL LOW (ref 8.9–10.3)
GFR calc Af Amer: >60 mL/min (ref >60)
GFR calc non Af Amer: >60 mL/min (ref >60)
GLUCOSE: 107 mg/dL — AB (ref 70–99)
Potassium: 3.8 mmol/L (ref 3.5–5.1)
Sodium: 137 mmol/L (ref 135–145)

## 2018-01-14 LAB — CBC WITH DIFFERENTIAL/PLATELET
Abs Immature Granulocytes: 0.1 10*3/uL (ref 0.0–0.1)
Basophils Absolute: 0.1 10*3/uL (ref 0.0–0.1)
Basophils Relative: 1 %
EOS PCT: 8 %
Eosinophils Absolute: 1 10*3/uL — ABNORMAL HIGH (ref 0.0–0.7)
HEMATOCRIT: 31.1 % — AB (ref 39.0–52.0)
HEMOGLOBIN: 9.5 g/dL — AB (ref 13.0–17.0)
IMMATURE GRANULOCYTES: 1 %
Lymphocytes Relative: 16 %
Lymphs Abs: 1.8 10*3/uL (ref 0.7–4.0)
MCH: 29.1 pg (ref 26.0–34.0)
MCHC: 30.5 g/dL (ref 30.0–36.0)
MCV: 95.4 fL (ref 78.0–100.0)
MONOS PCT: 8 %
Monocytes Absolute: 0.9 10*3/uL (ref 0.1–1.0)
NEUTROS ABS: 7.6 10*3/uL (ref 1.7–7.7)
Neutrophils Relative %: 66 %
Platelets: 340 10*3/uL (ref 150–400)
RBC: 3.26 MIL/uL — AB (ref 4.22–5.81)
RDW: 17.7 % — ABNORMAL HIGH (ref 11.5–15.5)
WBC: 11.4 10*3/uL — ABNORMAL HIGH (ref 4.0–10.5)

## 2018-01-14 LAB — GLUCOSE, CAPILLARY: Glucose-Capillary: 103 mg/dL — ABNORMAL HIGH (ref 70–99)

## 2018-01-14 NOTE — Progress Notes (Signed)
PT Cancellation Note  Patient Details Name: Kevin Whitney MRN: [id number] DOB: 1960-05-21   Cancelled Treatment:    Reason Eval/Treat Not Completed: (P) Patient declined, no reason specified(Pt declined PT services, reports his "soul" hurts and he needs to use the bathrooom.  Offered BSC to patient he remains to decline and reports he is too weak.  Educated on the benefits of mobility and patient remains to decline. )   Sera Hitsman J Kacper Cartlidge 01/14/2018, 5:17 PM  Joycelyn Rua, PTA pager [phone]

## 2018-01-14 NOTE — Progress Notes (Signed)
PROGRESS NOTE    Kevin Whitney  ZOX:[id number] DOB: 1960-09-15 DOA: 01/05/2018 PCP: [name], MD    Brief Narrative: [name] a 57 y.o.malewith medical history significant for PVD, tobacco abuse, degenerative disc disease of the spine status post operation, disuse atrophy of his muscles, history of MRSA of the spine and chronic narcotic dependence (on fentanyl patches) presented to the ED at [facility] via EMS due to dysuria and pressure ulcer of the left upper back which is chronic but developing a left lower back and sacral ulcer. Further work-up showed MRSA bacteremia, likely source his sacral ulcer. Pt was also found to have LLE gangrene, CT arteriogram showed severe PVD, and was subsequently transferred to [facility] for AKA by Dr Lajoyce Corners. General surgery was also consulted for his multiple ulcers that may need debridement. Pt admitted for further management.   Assessment & Plan:   Principal Problem:   MRSA bacteremia Active Problems:   Left buttock abscess   Stage III pressure ulcer of buttock (HCC)   Atherosclerosis of native artery of left lower extremity with gangrene (HCC)   Antibiotic-associated diarrhea   Proteus infection   Acute lower UTI   Pressure ulcer of left upper back, stage 3 (HCC)   Hypokalemia   H/O degenerative disc disease   Chronic narcotic dependence (HCC)   Disuse atrophy of muscle   Tobacco abuse counseling   Right middle lobe pulmonary nodule   Chronic diastolic CHF (congestive heart failure) (HCC)   Non-healing ulcer of groin with fat layer exposed (HCC)   Gangrene of left foot (HCC)   Severe protein-calorie malnutrition (HCC)   Bacteremia   Pressure injury of skin of sacral region   DDD (degenerative disc disease), lumbosacral   Chronic pain syndrome   Leukocytosis   Acute blood loss anemia  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  Arteriogram studies shows complete occlusion of the popliteal fossa on the  left Aspirin, Lipitor.   Sacral Decubitus;  S/P debridement on 01/07/18 Surgery following.  Wound vac.   MRSA Bacteremia;  Required IV pressors on admission at [place].  BC at [place] grew MRSA>  Repeated Blood culture;   TTE and TEE done at [facility] are both unremarkable Case was discussed by Dr Sharolyn [name] with Dr Orvan Falconer, and patient will need 3 Weeks of IV antibiotics. (Vancomycin)   Has Right IJ lumen tunneled placed by IR   WBC trending down.    Hypotension;  Overnight. IV fluids.  Lactic acid normal.  Cortisol low normal, ACTH cortisol 30 -60 minutes at 12--18. Discussed with endocrinologist, who recommend repeating test out patient. No need to start hydrocortisone at this time  On midodrine.  Received one unit PRBC Stable.   Anemia; hb trending down.  anemia panel. Iron deficiency.  Stool occult ordered and is negative.  Continue with oral ferrous  sulfate.  Improved post transfusion   Hypoglycemia;  cortisol. Low in am.  Amp d 50  Resolved.   proteus UTI; treated with keflex.   Chronic diastolic HF, stable.   Malnutrition.  Ensure.   Chronic pain syndrome/narcotic dependence Continue pain management with home fentanyl patch    DVT prophylaxis: heparin  Code Status: full code.  Family Communication: care discussed with patient.  Disposition Plan: needs SNF. Awaiting bed  Consultants:   Dr Lajoyce Corners   Procedures:    Antimicrobials:   Vancomycin 8-24   Subjective: No new complaints   Objective: Vitals:   01/13/18  0710 01/13/18 1238 01/13/18 2127 01/14/18 0537  BP: 106/74 100/70 109/74 (!) 109/57  Pulse: 92 (!) 102 (!) 109 94  Resp:  17 18 18   Temp: 98.6 F (37 C) 98.4 F (36.9 C) 98.5 F (36.9 C) 98.4 F (36.9 C)  TempSrc: Oral Oral Oral Oral  SpO2: 100% 100% 96% 97%  Weight:      Height:        Intake/Output Summary (Last 24 hours) at 01/14/2018 1312 Last data filed at 01/14/2018 1212 Gross per 24 hour  Intake 3177.56 ml  Output  3725 ml  Net -547.44 ml   Filed Weights   01/05/18 1305 01/06/18 0418 01/07/18 1126  Weight: 73.6 kg 60.9 kg 60.9 kg    Examination:  General exam: NAD Respiratory system: CTA Cardiovascular system: S 1, S 2 RRR Gastrointestinal system: BS present, soft, nt Central nervous system: Alert.  Extremities: Left LE with wound vac Skin: Decubitus ulcer.    Data Reviewed: I have personally reviewed following labs and imaging studies  CBC: Recent Labs  Lab 01/10/18 0450 01/11/18 0407 01/12/18 0357 01/13/18 0431 01/14/18 0322  WBC 14.8* 13.3* 14.6* 13.0* 11.4*  NEUTROABS 10.6* 9.2* 9.9* 9.1* 7.6  HGB 7.9* 7.7* 9.5* 9.2* 9.5*  HCT 26.2* 25.5* 31.0* 29.9* 31.1*  MCV 94.2 95.1 94.2 94.0 95.4  PLT 423* 387 397 357 340   Basic Metabolic Panel: Recent Labs  Lab 01/09/18 0505 01/10/18 0450 01/11/18 0407 01/13/18 0431 01/14/18 0322  NA 143 140 140 138 137  K 3.9 3.8 3.3* 3.8 3.8  CL 109 107 109 105 105  CO2 31 28 28 29 29   GLUCOSE 105* 110* 91 134* 107*  BUN 10 11 12 14 12   CREATININE 0.63 0.48* 0.58* 0.61 0.46*  CALCIUM 7.1* 7.3* 7.6* 7.8* 7.9*   GFR: Estimated Creatinine Clearance: 89.9 mL/min (A) (by C-G formula based on SCr of 0.46 mg/dL (L)). Liver Function Tests: No results for input(s): AST, ALT, ALKPHOS, BILITOT, PROT, ALBUMIN in the last 168 hours. No results for input(s): LIPASE, AMYLASE in the last 168 hours. No results for input(s): AMMONIA in the last 168 hours. Coagulation Profile: No results for input(s): INR, PROTIME in the last 168 hours. Cardiac Enzymes: No results for input(s): CKTOTAL, CKMB, CKMBINDEX, TROPONINI in the last 168 hours. BNP (last 3 results) No results for input(s): PROBNP in the last 8760 hours. HbA1C: No results for input(s): HGBA1C in the last 72 hours. CBG: Recent Labs  Lab 01/10/18 0754 01/11/18 0831 01/12/18 0742 01/13/18 0803 01/14/18 0809  GLUCAP 109* 91 86 79 103*   Lipid Profile: No results for input(s): CHOL,  HDL, LDLCALC, TRIG, CHOLHDL, LDLDIRECT in the last 72 hours. Thyroid Function Tests: No results for input(s): TSH, T4TOTAL, FREET4, T3FREE, THYROIDAB in the last 72 hours. Anemia Panel: No results for input(s): VITAMINB12, FOLATE, FERRITIN, TIBC, IRON, RETICCTPCT in the last 72 hours. Sepsis Labs: Recent Labs  Lab 01/09/18 0950  LATICACIDVEN 1.5    Recent Results (from the past 240 hour(s))  Culture, blood (routine x 2)     Status: None   Collection Time: 01/06/18 12:26 PM  Result Value Ref Range Status   Specimen Description BLOOD LEFT ANTECUBITAL  Final   Special Requests   Final    BOTTLES DRAWN AEROBIC AND ANAEROBIC Blood Culture adequate volume   Culture   Final    NO GROWTH 5 DAYS Performed at [facility], [address], [place], [place] [address]    Report  Status 01/11/2018 FINAL  Final  Culture, blood (routine x 2)     Status: None   Collection Time: 01/06/18 12:26 PM  Result Value Ref Range Status   Specimen Description BLOOD RIGHT ANTECUBITAL  Final   Special Requests   Final    BOTTLES DRAWN AEROBIC AND ANAEROBIC Blood Culture adequate volume   Culture   Final    NO GROWTH 5 DAYS Performed at [facility], [address], [place], [place] [address]    Report Status 01/11/2018 FINAL  Final  Aerobic/Anaerobic Culture (surgical/deep wound)     Status: None   Collection Time: 01/07/18  1:25 PM  Result Value Ref Range Status   Specimen Description ABSCESS  Final   Special Requests SACRAL DECUBITUS ULCER  Final   Gram Stain   Final    ABUNDANT WBC PRESENT, PREDOMINANTLY PMN FEW GRAM POSITIVE COCCI    Culture   Final    MODERATE METHICILLIN RESISTANT STAPHYLOCOCCUS AUREUS NO ANAEROBES ISOLATED Performed at [facility], [address] [place], [place] [address]    Report Status 01/12/2018 FINAL  Final   Organism ID, Bacteria METHICILLIN RESISTANT STAPHYLOCOCCUS AUREUS  Final      Susceptibility   Methicillin resistant staphylococcus aureus  - MIC*    CIPROFLOXACIN >=8 RESISTANT Resistant     ERYTHROMYCIN >=8 RESISTANT Resistant     GENTAMICIN <=0.5 SENSITIVE Sensitive     OXACILLIN >=4 RESISTANT Resistant     TETRACYCLINE <=1 SENSITIVE Sensitive     VANCOMYCIN <=0.5 SENSITIVE Sensitive     TRIMETH/SULFA <=10 SENSITIVE Sensitive     CLINDAMYCIN <=0.25 SENSITIVE Sensitive     RIFAMPIN <=0.5 SENSITIVE Sensitive     Inducible Clindamycin NEGATIVE Sensitive     * MODERATE METHICILLIN RESISTANT STAPHYLOCOCCUS AUREUS         Radiology Studies: No results found.      Scheduled Meds: . aspirin EC  81 mg Oral Daily  . atorvastatin  40 mg Oral q1800  . docusate sodium  100 mg Oral BID  . feeding supplement (ENSURE ENLIVE)  237 mL Oral TID BM  . feeding supplement (PRO-STAT SUGAR FREE 64)  30 mL Oral BID  . fentaNYL  75 mcg Transdermal Q72H  . ferrous sulfate  325 mg Oral BID WC  . midodrine  5 mg Oral TID WC  . multivitamin with minerals  1 tablet Oral Daily  . polyethylene glycol  17 g Oral BID   Continuous Infusions: . dextrose 5 % and 0.45% NaCl 75 mL/hr at 01/14/18 1212  . methocarbamol (ROBAXIN) IV    . vancomycin Stopped (01/14/18 1106)     LOS: 9 days    Time spent: 35 minutes.     Alba Cory, MD [facility] Pager [phone]  If 7PM-7AM, please contact night-coverage [email or web address] Password [email or web address] 01/14/2018, 1:12 PM

## 2018-01-14 NOTE — Plan of Care (Signed)
  Problem: Education: Goal: Knowledge of General Education information will improve Description: Including pain rating scale, medication(s)/side effects and non-pharmacologic comfort measures Outcome: Progressing   Problem: Clinical Measurements: Goal: Cardiovascular complication will be avoided Outcome: Progressing   Problem: Nutrition: Goal: Adequate nutrition will be maintained Outcome: Progressing   Problem: Coping: Goal: Level of anxiety will decrease Outcome: Progressing   

## 2018-01-14 NOTE — Progress Notes (Signed)
Spoke with patient during assessment; states feeling like he let down the treatment team today by not participating with PT. Explained he didn't feel well and his stomach hurt. Nurse reinforced sitting up and moving, though painful, could help reduce gas and improve GI motility.  Patient states he believes we are trying to improve his condition but there are several "family issues" going on today that were affecting his motivation.  Nurse suggested that he try to rest tonight and start fresh tomorrow with PT.  Patient agreed to try.

## 2018-01-14 NOTE — Progress Notes (Signed)
Subjective: Patient reports VAC dressing over the sacral changed and was painful.  No pain over left AKA   Objective: Vital signs in last 24 hours: Temp:  [98.4 F (36.9 C)-98.5 F (36.9 C)] 98.4 F (36.9 C) (09/02 0537) Pulse Rate:  [94-109] 94 (09/02 0537) Resp:  [17-18] 18 (09/02 0537) BP: (100-109)/(57-74) 109/57 (09/02 0537) SpO2:  [96 %-100 %] 97 % (09/02 0537)  Intake/Output from previous day: 09/01 0701 - 09/02 0700 In: 2300.6 [P.O.:120; I.V.:1780.6; IV Piggyback:400] Out: 3825 [Urine:3750; Drains:75] Intake/Output this shift: No intake/output data recorded.  Recent Labs    01/12/18 0357 01/13/18 0431 01/14/18 0322  HGB 9.5* 9.2* 9.5*   Recent Labs    01/13/18 0431 01/14/18 0322  WBC 13.0* 11.4*  RBC 3.18* 3.26*  HCT 29.9* 31.1*  PLT 357 340   Recent Labs    01/13/18 0431 01/14/18 0322  NA 138 137  K 3.8 3.8  CL 105 105  CO2 29 29  BUN 14 12  CREATININE 0.61 0.46*  GLUCOSE 134* 107*  CALCIUM 7.8* 7.9*   No results for input(s): LABPT, INR in the last 72 hours.  Left AKA with VAC dressing in place. Minimal serosanguinous drainage in VAC canister.       Assessment/Plan: POD # 8 S/P Left AKA- Plan to leave VAC in place for total of 2 weeks to left above the knee amputation incisional site.  Okay for DC to SNF when bed available from  Ortho standpoint.   [employer] [phone]

## 2018-01-14 NOTE — Social Work (Signed)
Continue to await insurance authorization for pt to discharge to [facility], insurance company is closed on weekends and holidays therefore may not be received today. Also need to ensure SNF has received wound vac for sacrum at facility prior to discharge.   Doy Hutching, LCSWA [facility] Clinical Social Work [phone]

## 2018-01-14 NOTE — Progress Notes (Signed)
ANTIBIOTIC CONSULT NOTE   Pharmacy Consult for Vancomycin Indication: MRSA wound and bacteremia  No Known Allergies  Patient Measurements: Height: 6\' 1"  (185.4 cm) Weight: 134 lb 4.8 oz (60.9 kg) IBW/kg (Calculated) : 79.9  Vital Signs: Temp: 98.4 F (36.9 C) (09/02 0537) Temp Source: Oral (09/02 0537) BP: 109/57 (09/02 0537) Pulse Rate: 94 (09/02 0537) Intake/Output from previous day: 09/01 0701 - 09/02 0700 In: 2300.6 [P.O.:120; I.V.:1780.6; IV Piggyback:400] Out: 3825 [Urine:3750; Drains:75] Intake/Output from this shift: No intake/output data recorded.  Labs: Recent Labs    01/12/18 0357 01/13/18 0431 01/14/18 0322  WBC 14.6* 13.0* 11.4*  HGB 9.5* 9.2* 9.5*  PLT 397 357 340  CREATININE  --  0.61 0.46*   Estimated Creatinine Clearance: 89.9 mL/min (A) (by C-G formula based on SCr of 0.46 mg/dL (L)). Recent Labs    01/11/18 1030  VANCOTROUGH 17     Microbiology: Recent Results (from the past 720 hour(s))  Culture, blood (routine x 2)     Status: None   Collection Time: 01/06/18 12:26 PM  Result Value Ref Range Status   Specimen Description BLOOD LEFT ANTECUBITAL  Final   Special Requests   Final    BOTTLES DRAWN AEROBIC AND ANAEROBIC Blood Culture adequate volume   Culture   Final    NO GROWTH 5 DAYS Performed at [facility], [address] [place], [place] [address]    Report Status 01/11/2018 FINAL  Final  Culture, blood (routine x 2)     Status: None   Collection Time: 01/06/18 12:26 PM  Result Value Ref Range Status   Specimen Description BLOOD RIGHT ANTECUBITAL  Final   Special Requests   Final    BOTTLES DRAWN AEROBIC AND ANAEROBIC Blood Culture adequate volume   Culture   Final    NO GROWTH 5 DAYS Performed at [facility], [address] [place], [place] [address]    Report Status 01/11/2018 FINAL  Final  Aerobic/Anaerobic Culture (surgical/deep wound)     Status: None   Collection Time: 01/07/18  1:25 PM  Result Value Ref  Range Status   Specimen Description ABSCESS  Final   Special Requests SACRAL DECUBITUS ULCER  Final   Gram Stain   Final    ABUNDANT WBC PRESENT, PREDOMINANTLY PMN FEW GRAM POSITIVE COCCI    Culture   Final    MODERATE METHICILLIN RESISTANT STAPHYLOCOCCUS AUREUS NO ANAEROBES ISOLATED Performed at [facility], [address], [place], [place] [address]    Report Status 01/12/2018 FINAL  Final   Organism ID, Bacteria METHICILLIN RESISTANT STAPHYLOCOCCUS AUREUS  Final      Susceptibility   Methicillin resistant staphylococcus aureus - MIC*    CIPROFLOXACIN >=8 RESISTANT Resistant     ERYTHROMYCIN >=8 RESISTANT Resistant     GENTAMICIN <=0.5 SENSITIVE Sensitive     OXACILLIN >=4 RESISTANT Resistant     TETRACYCLINE <=1 SENSITIVE Sensitive     VANCOMYCIN <=0.5 SENSITIVE Sensitive     TRIMETH/SULFA <=10 SENSITIVE Sensitive     CLINDAMYCIN <=0.25 SENSITIVE Sensitive     RIFAMPIN <=0.5 SENSITIVE Sensitive     Inducible Clindamycin NEGATIVE Sensitive     * MODERATE METHICILLIN RESISTANT STAPHYLOCOCCUS AUREUS    Assessment: 57 year old male with MRSA bacteremia and MRSA in sacral wound. TEE negative for IE at [facility]. The vancomycin trough at [facility] was therapeutic (15.7) on 1g IV every 12 hours and was therapeutic on 8/30 here as well. Scr remains  stable. ID recommended 3 week of vancomycin. Pt is afebrile and WBC is trending down.   Goal of Therapy:  Vancomycin trough level 15-20 mcg/ml  Plan:  Continue Vancomycin 1g IV q 12 hrs Monitor renal function, culture results, and clinical status MD - Please clarify stop date  Lysle Pearl, PharmD, BCPS Please see AMION for all pharmacy numbers 01/14/2018 8:49 AM

## 2018-01-15 LAB — GLUCOSE, CAPILLARY: Glucose-Capillary: 94 mg/dL (ref 70–99)

## 2018-01-15 MED ORDER — OXYCODONE HCL 10 MG PO TABS: 10.0000 mg | ORAL_TABLET | ORAL | 0 refills | Status: DC | PRN

## 2018-01-15 MED ORDER — PRO-STAT SUGAR FREE PO LIQD: 30.0000 mL | Freq: Two times a day (BID) | ORAL | 0 refills | Status: DC

## 2018-01-15 MED ORDER — VANCOMYCIN HCL IN DEXTROSE 1-5 GM/200ML-% IV SOLN: 1000.0000 mg | Freq: Two times a day (BID) | INTRAVENOUS | 0 refills | Status: DC

## 2018-01-15 MED ORDER — FAMOTIDINE 20 MG PO TABS: 20.0000 mg | ORAL_TABLET | Freq: Every day | ORAL | 0 refills | Status: DC

## 2018-01-15 MED ORDER — POLYETHYLENE GLYCOL 3350 17 G PO PACK: 17.0000 g | PACK | Freq: Two times a day (BID) | ORAL | 0 refills | Status: AC

## 2018-01-15 MED ORDER — FERROUS SULFATE 325 (65 FE) MG PO TABS: 325.0000 mg | ORAL_TABLET | Freq: Two times a day (BID) | ORAL | 3 refills | Status: DC

## 2018-01-15 MED ORDER — DOCUSATE SODIUM 100 MG PO CAPS: 100.0000 mg | ORAL_CAPSULE | Freq: Two times a day (BID) | ORAL | 0 refills | Status: AC

## 2018-01-15 MED ORDER — ENSURE ENLIVE PO LIQD: 237.0000 mL | Freq: Three times a day (TID) | ORAL | 12 refills | Status: DC

## 2018-01-15 MED ORDER — FENTANYL 75 MCG/HR TD PT72: 75.0000 ug | MEDICATED_PATCH | TRANSDERMAL | 0 refills | Status: DC

## 2018-01-15 MED ORDER — ASPIRIN 81 MG PO TBEC: 81.0000 mg | DELAYED_RELEASE_TABLET | Freq: Every day | ORAL | 0 refills | Status: DC

## 2018-01-15 MED ORDER — CYCLOBENZAPRINE HCL 10 MG PO TABS: 10.0000 mg | ORAL_TABLET | Freq: Three times a day (TID) | ORAL | 0 refills | Status: DC | PRN

## 2018-01-15 MED ORDER — FAMOTIDINE 20 MG PO TABS
20.0000 mg | ORAL_TABLET | Freq: Every day | ORAL | Status: DC
  Administered 2018-01-15 – 2018-01-16 (×2): 20 mg via ORAL
  Filled 2018-01-15 (×3): qty 1

## 2018-01-15 MED ORDER — MIDODRINE HCL 5 MG PO TABS: 5.0000 mg | ORAL_TABLET | Freq: Three times a day (TID) | ORAL | 0 refills | Status: DC

## 2018-01-15 MED ORDER — ATORVASTATIN CALCIUM 40 MG PO TABS: 40.0000 mg | ORAL_TABLET | Freq: Every day | ORAL | 0 refills | Status: DC

## 2018-01-15 NOTE — Discharge Summary (Signed)
Physician Discharge Summary  Kevin Whitney:[id number] DOB: Nov 08, 1960 DOA: 01/05/2018  PCP: [name], MD  Admit date: 01/05/2018 Discharge date: 01/15/2018  Admitted From: Home  Disposition: SNF  Recommendations for Outpatient Follow-up:  1. Follow up with PCP in 1-2 weeks 2. Please obtain BMP/CBC in one week 3. Needs follow up with wound care team.  4. Follow up with Dr Lajoyce Corners in 1 week.  5. He will need 2 more weeks of IV vancomycin. Needs follow up blood culture when he finished treatment.  6. Needs follow up with endocrinologist, needs repeat ACTH/Cortisol level.    Discharge Condition: Stable.  CODE STATUS: full code.  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 Further work-up showed MRSA bacteremia, likely source his sacral ulcer. Pt was also found to have LLE gangrene, CT arteriogram showed severe PVD, and was subsequently transferred to [facility] for AKA by Dr Lajoyce Corners. General surgery was also consulted for his multiple ulcers that may need debridement. Pt admitted for further management.   Assessment & Plan:   Principal Problem:   MRSA bacteremia Active Problems:   Left buttock abscess   Stage III pressure ulcer of buttock (HCC)   Atherosclerosis of native artery of left lower extremity with gangrene (HCC)   Antibiotic-associated diarrhea   Proteus infection   Acute lower UTI   Pressure ulcer of left upper back, stage 3 (HCC)   Hypokalemia   H/O degenerative disc disease   Chronic narcotic dependence (HCC)    Disuse atrophy of muscle   Tobacco abuse counseling   Right middle lobe pulmonary nodule   Chronic diastolic CHF (congestive heart failure) (HCC)   Non-healing ulcer of groin with fat layer exposed (HCC)   Gangrene of left foot (HCC)   Severe protein-calorie malnutrition (HCC)   Bacteremia   Pressure injury of skin of sacral region   DDD (degenerative disc disease), lumbosacral   Chronic pain syndrome   Leukocytosis   Acute blood loss anemia  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  Arteriogram studies shows complete occlusion of the popliteal fossa on the left Aspirin, Lipitor.  Dr Lajoyce Corners will removed wound vac today.  Plan to discharge to rehab.  Follow with Dr Lajoyce Corners in 1 week.   Sacral Decubitus;  S/P debridement on 01/07/18 Surgery following.  Wound vac.  Follow with wound care.   MRSA Bacteremia;  Required IV pressors on admission at [place].  BC at [place] grew MRSA>  Repeated Blood culture;  negative TTE and TEE done at [facility] are both unremarkable Case was discussed by Dr Sharolyn [name] with Dr Orvan Falconer, and patient will need 3 Weeks of IV antibiotics. (Vancomycin)   Has Right IJ lumen tunneled placed by IR   WBC trending down.  needs two more week of vancomycin   Hypotension;  Overnight. IV fluids.  Lactic acid normal.  Cortisol low normal, ACTH cortisol 30 -60 minutes at 12--18. Discussed with endocrinologist, who recommend repeating test out patient. No need to start hydrocortisone at this time  Received one unit PRBC  Stable. Discharge on midodrine.   Anemia; hb trending down.  anemia panel. Iron deficiency.  Stool occult ordered and is negative.  Continue with oral ferrous  sulfate.  Improved post transfusion   Hypoglycemia;  cortisol. Low in am.  Amp d 50  Resolved.   proteus UTI; treated with keflex.   Chronic diastolic HF, stable.   Malnutrition.  Ensure.   Chronic pain syndrome/narcotic dependence Continue  pain management with home fentanyl patch    Discharge Diagnoses:  Principal Problem:   MRSA bacteremia Active Problems:   Left buttock abscess   Stage III pressure ulcer of buttock (HCC)   Atherosclerosis of native artery of left lower extremity with gangrene (HCC)   Antibiotic-associated diarrhea   Proteus infection   Acute lower UTI   Pressure ulcer of left upper back, stage 3 (HCC)   Hypokalemia   H/O degenerative disc disease   Chronic narcotic dependence (HCC)   Disuse atrophy of muscle   Tobacco abuse counseling   Right middle lobe pulmonary nodule   Chronic diastolic CHF (congestive heart failure) (HCC)   Non-healing ulcer of groin with fat layer exposed (HCC)   Gangrene of left foot (HCC)   Severe protein-calorie malnutrition (HCC)   Bacteremia   Pressure injury of skin of sacral region   DDD (degenerative disc disease), lumbosacral   Chronic pain syndrome   Leukocytosis   Acute blood loss anemia    Discharge Instructions  Discharge Instructions    Diet - low sodium heart healthy   Complete by:  As directed    Increase activity slowly   Complete by:  As directed      Allergies as of 01/15/2018   No Known Allergies     Medication List    TAKE these medications   aspirin 81 MG EC tablet Take 1 tablet (81 mg total) by mouth daily. Start taking on:  01/16/2018   atorvastatin 40 MG tablet Commonly known as:  LIPITOR Take 1 tablet (40 mg total) by mouth daily at 6 PM.   cyclobenzaprine 10 MG tablet Commonly known as:  FLEXERIL Take 1 tablet (10 mg total) by mouth 3 (three) times daily as needed for muscle spasms.   DESITIN 40 % ointment Generic drug:  liver oil-zinc oxide Apply 1 application topically as needed for irritation.   diazepam 10 MG tablet Commonly known as:  VALIUM Take 10 mg by mouth every 12 (twelve) hours as needed for anxiety.   docusate sodium 100 MG capsule Commonly known as:  COLACE Take 1 capsule (100 mg total) by mouth 2  (two) times daily.   famotidine 20 MG tablet Commonly known as:  PEPCID Take 1 tablet (20 mg total) by mouth daily.   feeding supplement (ENSURE ENLIVE) Liqd Take 237 mLs by mouth 3 (three) times daily between meals.   feeding supplement (PRO-STAT SUGAR FREE 64) Liqd Take 30 mLs by mouth 2 (two) times daily.   fentaNYL 75 MCG/HR Commonly known as:  DURAGESIC - dosed mcg/hr Place 1 patch (75 mcg total) onto the skin every 3 (three) days. Start taking on:  01/17/2018 What changed:  when to take this   ferrous sulfate 325 (65 FE) MG tablet Take 1 tablet (325 mg total) by mouth 2 (two) times daily with a meal.   midodrine 5 MG tablet Commonly known as:  PROAMATINE Take 1 tablet (5 mg total) by mouth 3 (three) times daily with meals.   multivitamin with minerals Tabs tablet Take 1 tablet  by mouth daily.   Oxycodone HCl 10 MG Tabs Take 1 tablet (10 mg total) by mouth every 3 (three) hours as needed for breakthrough pain.   polyethylene glycol packet Commonly known as:  MIRALAX / GLYCOLAX Take 17 g by mouth 2 (two) times daily.   vancomycin 1-5 GM/200ML-% Soln Commonly known as:  VANCOCIN Inject 200 mLs (1,000 mg total) into the vein every 12 (twelve) hours.       Contact information for follow-up providers    Nadara Mustard, MD In 1 week.   Specialty:  Orthopedic Surgery Contact information: [address] [place] [address] [phone]        [facility]             . Call.   Why:  Call and schedule an appointment in a few weeks when discharged from SNF to check sacral decubitus wound.  Contact information: [address] [place] [phone]           Contact information for after-discharge care    Destination    [facility] Preferred SNF .   Service:  Skilled Nursing Contact information: [address] [place]  [address] [phone]                 No Known Allergies  Consultations: DR Lajoyce Corners  Procedures/Studies: Ir Fluoro Guide Cv Line Right  Result Date: 01/07/2018 INDICATION: 57 year old male with a history of above knee amputation EXAM: ULTRASOUND-GUIDED TUNNELED CENTRAL CATHETER MEDICATIONS: None ANESTHESIA/SEDATION: None FLUOROSCOPY TIME:  Fluoroscopy Time: 0 minutes 6 seconds COMPLICATIONS: None PROCEDURE: After written informed consent was obtained, patient was placed in the supine position on angiographic table. Patency of the right internal jugular vein was confirmed with ultrasound with image documentation. Patient was prepped and draped in the usual sterile fashion including the right neck and right superior chest. Using ultrasound guidance, the skin and subcutaneous tissues overlying the right internal jugular vein were generously infiltrated with 1% lidocaine without epinephrine. Using ultrasound guidance, the right internal jugular vein was punctured with a micropuncture needle, and an 018 wire was advanced into the right heart confirming venous access. A small stab incision was made with an 11 blade scalpel. Peel-away sheath was placed over the wire, and then the wire was removed, marking the wire for estimation of internal catheter length. The chest wall was then generously infiltrated with 1% lidocaine for local anesthesia along the tissue tract. Small stab incision was made with 11 blade scalpel, and then the catheter was back tunneled to the puncture site at the right internal jugular vein. Catheter was pulled through the tract, with the catheter amputated at 23.5 cm cm. Catheter was advanced through the peel-away sheath, and the peel-away sheath was removed. Final image was stored. The catheter was anchored to the chest wall with 2 retention sutures, and Derma bond was used to seal the right internal jugular vein incision site and at the right chest wall. Patient tolerated the procedure  well and remained hemodynamically stable throughout. No complications were encountered and no significant blood loss was encountered. IMPRESSION: Status post right IJ tunneled cuffed catheter placement. Catheter ready for use. Signed, Yvone Neu. Reyne Dumas, RPVI [facility] Radiology Electronically Signed   By: Gilmer Mor D.O.   On: 01/07/2018 11:42   Ir US Guide Vasc Access Right  Result Date: 01/07/2018 INDICATION: 57 year old male with a history  of above knee amputation EXAM: ULTRASOUND-GUIDED TUNNELED CENTRAL CATHETER MEDICATIONS: None ANESTHESIA/SEDATION: None FLUOROSCOPY TIME:  Fluoroscopy Time: 0 minutes 6 seconds COMPLICATIONS: None PROCEDURE: After written informed consent was obtained, patient was placed in the supine position on angiographic table. Patency of the right internal jugular vein was confirmed with ultrasound with image documentation. Patient was prepped and draped in the usual sterile fashion including the right neck and right superior chest. Using ultrasound guidance, the skin and subcutaneous tissues overlying the right internal jugular vein were generously infiltrated with 1% lidocaine without epinephrine. Using ultrasound guidance, the right internal jugular vein was punctured with a micropuncture needle, and an 018 wire was advanced into the right heart confirming venous access. A small stab incision was made with an 11 blade scalpel. Peel-away sheath was placed over the wire, and then the wire was removed, marking the wire for estimation of internal catheter length. The chest wall was then generously infiltrated with 1% lidocaine for local anesthesia along the tissue tract. Small stab incision was made with 11 blade scalpel, and then the catheter was back tunneled to the puncture site at the right internal jugular vein. Catheter was pulled through the tract, with the catheter amputated at 23.5 cm cm. Catheter was advanced through the  peel-away sheath, and the peel-away sheath was removed. Final image was stored. The catheter was anchored to the chest wall with 2 retention sutures, and Derma bond was used to seal the right internal jugular vein incision site and at the right chest wall. Patient tolerated the procedure well and remained hemodynamically stable throughout. No complications were encountered and no significant blood loss was encountered. IMPRESSION: Status post right IJ tunneled cuffed catheter placement. Catheter ready for use. Signed, Yvone Neu. Reyne Dumas, RPVI [facility] Radiology Electronically Signed   By: Gilmer Mor D.O.   On: 01/07/2018 11:42   Dg Chest Port 1 View  Result Date: 01/05/2018 CLINICAL DATA:  Line placement, preoperative for left above-the-knee amputation EXAM: PORTABLE CHEST 1 VIEW COMPARISON:  01/01/2018 chest radiograph. FINDINGS: Right subclavian central venous catheter terminates in the middle third of the SVC. Spinal fusion hardware overlies the left lower thoracic spine. Stable cardiomediastinal silhouette with normal heart size. No pneumothorax. No pleural effusion. Nodular 1 cm opacity overlying peripheral mid to lower right lung, stable since 01/01/2018 radiograph, new since 03/09/2016 chest CT. No pulmonary edema. Stable mild scarring versus atelectasis at the left greater than right lung bases. IMPRESSION: 1. Nodular 1 cm opacity overlying the peripheral mid to lower right lung, new since 2017 chest CT. Cannot exclude pulmonary nodule. Recommend further evaluation chest CT. 2. Stable mild scarring at the left lung base. 3. Well-positioned right subclavian central venous catheter. Electronically Signed   By: Delbert Phenix M.D.   On: 01/05/2018 16:52     Subjective: He is feeling better , was sick yesterday from eating bacon.   Discharge Exam: Vitals:   01/14/18 2116 01/15/18 0648  BP: 92/72 98/68  Pulse: (!) 107 94  Resp: 17 16  Temp:  99.7 F (37.6 C) 98.6 F (37 C)  SpO2: 98% 98%   Vitals:   01/14/18 0537 01/14/18 1502 01/14/18 2116 01/15/18 0648  BP: (!) 109/57 123/81 92/72 98/68   Pulse: 94 98 (!) 107 94  Resp: 18 17 17 16   Temp: 98.4 F (36.9 C) 99.1 F (37.3 C) 99.7 F (37.6 C) 98.6 F (37 C)  TempSrc: Oral Oral Oral Oral  SpO2: 97% 98% 98%  98%  Weight:      Height:        General: Pt is alert, awake, not in acute distress Cardiovascular: RRR, S1/S2 +, no rubs, no gallops Respiratory: CTA bilaterally, no wheezing, no rhonchi Abdominal: Soft, NT, ND, bowel sounds + Extremities: Wound vac left LE Decubitus ulcer with wound vac.     The results of significant diagnostics from this hospitalization (including imaging, microbiology, ancillary and laboratory) are listed below for reference.     Microbiology: Recent Results (from the past 240 hour(s))  Culture, blood (routine x 2)     Status: None   Collection Time: 01/06/18 12:26 PM  Result Value Ref Range Status   Specimen Description BLOOD LEFT ANTECUBITAL  Final   Special Requests   Final    BOTTLES DRAWN AEROBIC AND ANAEROBIC Blood Culture adequate volume   Culture   Final    NO GROWTH 5 DAYS Performed at [facility], [address], [place], [place] [address]    Report Status 01/11/2018 FINAL  Final  Culture, blood (routine x 2)     Status: None   Collection Time: 01/06/18 12:26 PM  Result Value Ref Range Status   Specimen Description BLOOD RIGHT ANTECUBITAL  Final   Special Requests   Final    BOTTLES DRAWN AEROBIC AND ANAEROBIC Blood Culture adequate volume   Culture   Final    NO GROWTH 5 DAYS Performed at [facility], [address] [place], [place] [address]    Report Status 01/11/2018 FINAL  Final  Aerobic/Anaerobic Culture (surgical/deep wound)     Status: None   Collection Time: 01/07/18  1:25 PM  Result Value Ref Range Status   Specimen Description ABSCESS  Final   Special Requests SACRAL DECUBITUS ULCER  Final    Gram Stain   Final    ABUNDANT WBC PRESENT, PREDOMINANTLY PMN FEW GRAM POSITIVE COCCI    Culture   Final    MODERATE METHICILLIN RESISTANT STAPHYLOCOCCUS AUREUS NO ANAEROBES ISOLATED Performed at [facility] Lab, [address] [place], [place] [address]    Report Status 01/12/2018 FINAL  Final   Organism ID, Bacteria METHICILLIN RESISTANT STAPHYLOCOCCUS AUREUS  Final      Susceptibility   Methicillin resistant staphylococcus aureus - MIC*    CIPROFLOXACIN >=8 RESISTANT Resistant     ERYTHROMYCIN >=8 RESISTANT Resistant     GENTAMICIN <=0.5 SENSITIVE Sensitive     OXACILLIN >=4 RESISTANT Resistant     TETRACYCLINE <=1 SENSITIVE Sensitive     VANCOMYCIN <=0.5 SENSITIVE Sensitive     TRIMETH/SULFA <=10 SENSITIVE Sensitive     CLINDAMYCIN <=0.25 SENSITIVE Sensitive     RIFAMPIN <=0.5 SENSITIVE Sensitive     Inducible Clindamycin NEGATIVE Sensitive     * MODERATE METHICILLIN RESISTANT STAPHYLOCOCCUS AUREUS     Labs: BNP (last 3 results) No results for input(s): BNP in the last 8760 hours. Basic Metabolic Panel: Recent Labs  Lab 01/09/18 0505 01/10/18 0450 01/11/18 0407 01/13/18 0431 01/14/18 0322  NA 143 140 140 138 137  K 3.9 3.8 3.3* 3.8 3.8  CL 109 107 109 105 105  CO2 31 28 28 29 29   GLUCOSE 105* 110* 91 134* 107*  BUN 10 11 12 14 12   CREATININE 0.63 0.48* 0.58* 0.61 0.46*  CALCIUM 7.1* 7.3* 7.6* 7.8* 7.9*   Liver Function Tests: No results for input(s): AST, ALT, ALKPHOS, BILITOT, PROT, ALBUMIN in the last 168 hours. No results for input(s): LIPASE,  AMYLASE in the last 168 hours. No results for input(s): AMMONIA in the last 168 hours. CBC: Recent Labs  Lab 01/10/18 0450 01/11/18 0407 01/12/18 0357 01/13/18 0431 01/14/18 0322  WBC 14.8* 13.3* 14.6* 13.0* 11.4*  NEUTROABS 10.6* 9.2* 9.9* 9.1* 7.6  HGB 7.9* 7.7* 9.5* 9.2* 9.5*  HCT 26.2* 25.5* 31.0* 29.9* 31.1*  MCV 94.2 95.1 94.2 94.0 95.4  PLT 423* 387 397 357 340   Cardiac Enzymes: No results  for input(s): CKTOTAL, CKMB, CKMBINDEX, TROPONINI in the last 168 hours. BNP: Invalid input(s): POCBNP CBG: Recent Labs  Lab 01/11/18 0831 01/12/18 0742 01/13/18 0803 01/14/18 0809 01/15/18 0842  GLUCAP 91 86 79 103* 94   D-Dimer No results for input(s): DDIMER in the last 72 hours. Hgb A1c No results for input(s): HGBA1C in the last 72 hours. Lipid Profile No results for input(s): CHOL, HDL, LDLCALC, TRIG, CHOLHDL, LDLDIRECT in the last 72 hours. Thyroid function studies No results for input(s): TSH, T4TOTAL, T3FREE, THYROIDAB in the last 72 hours.  Invalid input(s): FREET3 Anemia work up No results for input(s): VITAMINB12, FOLATE, FERRITIN, TIBC, IRON, RETICCTPCT in the last 72 hours. Urinalysis No results found for: COLORURINE, APPEARANCEUR, LABSPEC, PHURINE, GLUCOSEU, HGBUR, BILIRUBINUR, KETONESUR, PROTEINUR, UROBILINOGEN, NITRITE, LEUKOCYTESUR Sepsis Labs Invalid input(s): PROCALCITONIN,  WBC,  LACTICIDVEN Microbiology Recent Results (from the past 240 hour(s))  Culture, blood (routine x 2)     Status: None   Collection Time: 01/06/18 12:26 PM  Result Value Ref Range Status   Specimen Description BLOOD LEFT ANTECUBITAL  Final   Special Requests   Final    BOTTLES DRAWN AEROBIC AND ANAEROBIC Blood Culture adequate volume   Culture   Final    NO GROWTH 5 DAYS Performed at [facility], [address], [place], [place] [address]    Report Status 01/11/2018 FINAL  Final  Culture, blood (routine x 2)     Status: None   Collection Time: 01/06/18 12:26 PM  Result Value Ref Range Status   Specimen Description BLOOD RIGHT ANTECUBITAL  Final   Special Requests   Final    BOTTLES DRAWN AEROBIC AND ANAEROBIC Blood Culture adequate volume   Culture   Final    NO GROWTH 5 DAYS Performed at [facility], [address] [place], [place] [address]    Report Status 01/11/2018 FINAL  Final  Aerobic/Anaerobic Culture (surgical/deep wound)     Status: None    Collection Time: 01/07/18  1:25 PM  Result Value Ref Range Status   Specimen Description ABSCESS  Final   Special Requests SACRAL DECUBITUS ULCER  Final   Gram Stain   Final    ABUNDANT WBC PRESENT, PREDOMINANTLY PMN FEW GRAM POSITIVE COCCI    Culture   Final    MODERATE METHICILLIN RESISTANT STAPHYLOCOCCUS AUREUS NO ANAEROBES ISOLATED Performed at [facility], [address], [place], [place] [address]    Report Status 01/12/2018 FINAL  Final   Organism ID, Bacteria METHICILLIN RESISTANT STAPHYLOCOCCUS AUREUS  Final      Susceptibility   Methicillin resistant staphylococcus aureus - MIC*    CIPROFLOXACIN >=8 RESISTANT Resistant     ERYTHROMYCIN >=8 RESISTANT Resistant     GENTAMICIN <=0.5 SENSITIVE Sensitive     OXACILLIN >=4 RESISTANT Resistant     TETRACYCLINE <=1 SENSITIVE Sensitive     VANCOMYCIN <=0.5 SENSITIVE Sensitive     TRIMETH/SULFA <=10 SENSITIVE Sensitive     CLINDAMYCIN <=0.25 SENSITIVE Sensitive  RIFAMPIN <=0.5 SENSITIVE Sensitive     Inducible Clindamycin NEGATIVE Sensitive     * MODERATE METHICILLIN RESISTANT STAPHYLOCOCCUS AUREUS     Time coordinating discharge: 35 minutes.   SIGNED:   Alba Cory, MD  [facility] 01/15/2018, 11:29 AM Pager [phone]  If 7PM-7AM, please contact night-coverage [email or web address] Password [email or web address]

## 2018-01-15 NOTE — Progress Notes (Signed)
Nutrition Follow-up  DOCUMENTATION CODES:   Not applicable  INTERVENTION:   -Continue Ensure Enlive po TID, each supplement provides 350 kcal and 20 grams of protein -Continue 30 ml Prostat BID, each supplement provides 100 kcals and 15 grams protein -Continue MVI with minerals daily  NUTRITION DIAGNOSIS:   Increased nutrient needs related to wound healing as evidenced by estimated needs.  Ongoing  GOAL:   Patient will meet greater than or equal to 90% of their needs  Progressing  MONITOR:   PO intake, Supplement acceptance, Labs, Weight trends, Skin, I & O's  REASON FOR ASSESSMENT:   Consult Assessment of nutrition requirement/status, Diet education, Wound healing  ASSESSMENT:   Kevin Whitney is a 57 y.o. 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  8/25- s/p lt AKA, with wound vac, s/p central line for IV access; s/p CWOCN eval- unstageable rt heel, full thickness rt thigh/sacrum, satellite wounds on back superior to sacral wound 8/26- s/p I&D of sacral ulcer 8/28- hypoglycemic event 8/29- wound vac placed to sacral wounds  Reviewed I/O's: -556 ml x 24 hours and +2.6 L since admission  Pt remains with good appetite; meal completion PO: 35-100%.   Case discussed with RN, who reports pt with continued good appetite and compliant with supplements. Pt is medically ready for discharge, but awaiting insurance authorization for SNF placement.   Labs reviewed: CBGS: 79-103.   NUTRITION - FOCUSED PHYSICAL EXAM:    Most Recent Value  Orbital Region  Severe depletion  Upper Arm  Region  Severe depletion  Thoracic and Lumbar Region  Mild depletion  Buccal Region  Severe depletion  Temple Region  Severe depletion  Clavicle Bone Region  Severe depletion  Clavicle and Acromion Bone Region  Severe depletion  Scapular Bone Region  Severe depletion  Dorsal Hand  Severe depletion  Patellar Region  Severe depletion  Anterior Thigh Region  Severe depletion  Posterior Calf Region  Severe depletion  Edema (RD Assessment)  None  Hair  Reviewed  Eyes  Reviewed  Mouth  Reviewed  Skin  Reviewed  Nails  Reviewed       Diet Order:   Diet Order            Diet - low sodium heart healthy        Diet regular Room service appropriate? Yes; Fluid consistency: Thin  Diet effective now              EDUCATION NEEDS:   No education needs have been identified at this time  Skin:  Skin Assessment: Skin Integrity Issues: Skin Integrity Issues:: Wound VAC, Unstageable, Other (Comment) Unstageable: rt heel Wound Vac: lt AKA, sacrum Other: full thickness rt thigh/sacrum, satellite wounds on back superior to sacrum  Last BM:  01/14/18  Height:   Ht Readings from Last 1 Encounters:  01/07/18 6\' 1"  (1.854 m)    Weight:   Wt Readings from Last 1 Encounters:  01/07/18 60.9 kg    Ideal Body Weight:  71.2 kg  BMI:  Body mass index is 17.72 kg/m.  Estimated Nutritional Needs:   Kcal:  1850-2050  Protein:  120-135 grams  Fluid:  >1.8 L    Jhada Risk A. Mayford Knife, RD, LDN, CDE Pager: [phone] After hours Pager: [phone]

## 2018-01-15 NOTE — Social Work (Signed)
Auth still pending for pt to discharge to [facility], the facility has received wound vac for sacrum, pt able to discharge once auth received.  Doy Hutching, LCSWA [facility] Clinical Social Work [phone]

## 2018-01-15 NOTE — Progress Notes (Signed)
Occupational Therapy Treatment Patient Details Name: Kevin Whitney MRN: [id number] DOB: 12/02/1960 Today's Date: 01/15/2018    History of present illness Kevin Whitney is a 57 y.o. 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 Further work-up showed MRSA bacteremia, likely source his sacral ulcer.  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   OT comments  Pt demonstrating progress toward OT goals and was able to progress with functional mobility for toilet transfers requiring min assist for anterior/posterior transfer from bed to recliner. He continues to be limited by pain and decreased balance and gross motor coordination. Pt able to complete grooming tasks with set-up once seated in recliner. D/C recommendation remains appropriate. OT will continue to follow while admitted.    Follow Up Recommendations  SNF;Supervision/Assistance - 24 hour    Equipment Recommendations  3 in 1 bedside commode    Recommendations for Other Services      Precautions / Restrictions Precautions Precautions: Fall Precaution Comments: wound VAC x2 Restrictions Weight Bearing Restrictions: Yes LLE Weight Bearing: Non weight bearing       Mobility Bed Mobility Overal bed mobility: Needs Assistance Bed Mobility: Supine to Sit       Sit to supine: Max assist   General bed mobility comments: Attempted to come into long sitting position requiring significant assistance. Able to use elbows to prop self up once helicoptering himself toward chair in preparation  for anterior/posterior transfer.   Transfers Overall transfer level: Needs assistance Equipment used: None Transfers: Licensed conveyancer transfers: Min assist   General transfer comment: Assist with bed pad to ensure safe, smooth transfer.    Balance Overall balance assessment: Needs assistance Sitting-balance support: Bilateral upper extremity supported;Feet unsupported Sitting balance-Leahy Scale: Poor Sitting balance - Comments: Relies on BUE support and external support                                   ADL either performed or assessed with clinical judgement   ADL Overall ADL's : Needs assistance/impaired Eating/Feeding: Bed level;Sitting;Set up Eating/Feeding Details (indicate cue type and reason): bed level or seated in recliner Grooming: Wash/dry face;Sitting;Set up Grooming Details (indicate cue type and reason): sitting with back support in recliner.              Lower Body Dressing: Total assistance;Bed level   Toilet Transfer: Anterior/posterior;Minimal assistance Toilet Transfer Details (indicate cue type and reason): min assist at pad to ensure smooth transition; pt requiring multiple scoots and prefers using elbows on the armrests to power body back in to chair.  Toileting- Clothing Manipulation and Hygiene: Total assistance;Sitting/lateral lean         General ADL Comments: Pt able to complete mobility to chair with anterior/posterior technique and min assist. Took care to protect wound vac dressing. Noted edge of dressing stuck to pad and gently replaced on pt. No leaking noted at that time but after sitting in chair for approximately 5 minutes,  vac began to alarm with leak alert and notified RN.      Vision   Vision Assessment?: No apparent visual deficits   Perception     Praxis      Cognition Arousal/Alertness: Awake/alert Behavior During Therapy: Impulsive Overall Cognitive Status:  Impaired/Different from baseline Area of Impairment: Attention;Following commands;Safety/judgement;Awareness                   Current Attention Level: Selective   Following Commands: Follows one step commands with increased time Safety/Judgement: Decreased awareness of safety;Decreased awareness of deficits Awareness: Emergent Problem Solving: Slow processing;Difficulty sequencing;Requires verbal cues General Comments: Pt internally distracted. He requires cues to remain on topics.         Exercises     Shoulder Instructions       General Comments Wound vac present at sacrum as well as R residual limb.    Pertinent Vitals/ Pain       Pain Assessment: Faces Faces Pain Scale: Hurts even more Pain Location: L residual limb with transfer Pain Descriptors / Indicators: Grimacing;Guarding Pain Intervention(s): Limited activity within patient's tolerance;Monitored during session;Repositioned  Home Living                                          Prior Functioning/Environment              Frequency  Min 2X/week        Progress Toward Goals  OT Goals(current goals can now be found in the care plan section)  Progress towards OT goals: Progressing toward goals  Acute Rehab OT Goals Patient Stated Goal: to get stronger OT Goal Formulation: With patient Time For Goal Achievement: 01/22/18 Potential to Achieve Goals: Good  Plan Discharge plan remains appropriate    Co-evaluation                 AM-PAC PT "6 Clicks" Daily Activity     Outcome Measure   Help from another person eating meals?: None Help from another person taking care of personal grooming?: None(with back support) Help from another person toileting, which includes using toliet, bedpan, or urinal?: A Lot Help from another person bathing (including washing, rinsing, drying)?: A Lot Help from another person to put on and taking off regular upper body clothing?: A  Little Help from another person to put on and taking off regular lower body clothing?: Total 6 Click Score: 16    End of Session Equipment Utilized During Treatment: Gait belt;Rolling walker  OT Visit Diagnosis: Other abnormalities of gait and mobility (R26.89);Pain Pain - Right/Left: Right Pain - part of body: Leg(residual limb)   Activity Tolerance Patient limited by pain   Patient Left in bed;with call bell/phone within reach   Nurse Communication Mobility status        Time: 1010-1033 OT Time Calculation (min): 23 min  Charges: OT General Charges $OT Visit: 1 Visit OT Treatments $Self Care/Home Management : 23-37 mins  Derrell Lolling, OTR/L Acute Rehabilitation Services Pager [phone] Office [phone]    [facility] A Lashaye Fisk 01/15/2018, 12:19 PM

## 2018-01-15 NOTE — Plan of Care (Signed)
  Problem: Health Behavior/Discharge Planning: Goal: Ability to manage health-related needs will improve Outcome: Not Progressing   Problem: Clinical Measurements: Goal: Will remain free from infection Outcome: Progressing Goal: Diagnostic test results will improve Outcome: Progressing

## 2018-01-16 LAB — GLUCOSE, CAPILLARY: Glucose-Capillary: 99 mg/dL (ref 70–99)

## 2018-01-16 MED ORDER — FENTANYL 75 MCG/HR TD PT72: 75.0000 ug | MEDICATED_PATCH | TRANSDERMAL | 0 refills | Status: DC

## 2018-01-16 MED ORDER — OXYCODONE HCL 10 MG PO TABS: 10.0000 mg | ORAL_TABLET | ORAL | 0 refills | Status: DC | PRN

## 2018-01-16 MED ORDER — DIAZEPAM 10 MG PO TABS: 10.0000 mg | ORAL_TABLET | Freq: Two times a day (BID) | ORAL | 0 refills | Status: DC | PRN

## 2018-01-16 NOTE — Social Work (Signed)
Authorization continues to pend with [facility] Medicare, have discussed case with CSW Chiropodist. Facility will not accept LOG. Continue to follow.  Doy Hutching, LCSWA [facility] Clinical Social Work [phone]

## 2018-01-16 NOTE — Progress Notes (Signed)
Physical Therapy Treatment Patient Details Name: Kevin Whitney MRN: [id number] DOB: 13-Nov-1960 Today's Date: 01/16/2018    History of Present Illness Kevin Whitney is a 57 y.o. 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 Further work-up showed MRSA bacteremia, likely source his sacral ulcer.  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    PT Comments    Pt performed bed mobility including scooting and rolling as he continues to refuse OOB activity.  Pt reports he is in too much pain from dressing change.  Pt's bandage falling off of residual limb and PTA wrapped for compression.  Informed nursing he may benefit from limb shrinker.  Of note patient also presenting with increased redness of inner thighs and groin and could benefit from powder of some source to dry out this area.  Pt is self limiting in regards to behavior and requires constant cues to focus on task.  Continue to recommend SNF placement for improved strength and function at this time.     Follow Up Recommendations  SNF     Equipment Recommendations  None recommended by PT    Recommendations for Other Services       Precautions / Restrictions Precautions Precautions: Fall Precaution Comments: wound VAC x2 Restrictions Weight Bearing Restrictions: Yes LLE Weight Bearing: Non weight bearing    Mobility  Bed Mobility Overal bed mobility: Needs Assistance Bed Mobility: Rolling Rolling: Supervision         General bed mobility comments: Pt performed boosting to [facility] and  rolling with supervision.  Pt slow and guarded due to pain but did not require assistance to roll and boost refused to sit edge of bed so focused on LE strengthening.    Transfers                    Ambulation/Gait                 Stairs             Wheelchair Mobility    Modified Rankin (Stroke Patients Only)       Balance                                            Cognition Arousal/Alertness: Awake/alert Behavior During Therapy: Impulsive Overall Cognitive Status: Impaired/Different from baseline Area of Impairment: Attention;Following commands;Safety/judgement;Awareness                   Current Attention Level: Selective   Following Commands: Follows one step commands with increased time Safety/Judgement: Decreased awareness of safety;Decreased awareness of deficits Awareness: Emergent Problem Solving: Slow processing;Difficulty sequencing;Requires verbal cues General Comments: Pt internally distracted. He requires cues to remain on topics.       Exercises General Exercises - Lower Extremity Ankle Circles/Pumps: AROM;AAROM;Right;20 reps;Supine Quad Sets: AROM;Right;10 reps;Supine Heel Slides: AROM;Right;10 reps;Supine Amputee Exercises Hip Extension: Left;10 reps;Sidelying;AAROM Straight Leg Raises: AROM;Left;10 reps;Supine    General  Comments        Pertinent Vitals/Pain Pain Assessment: Faces Faces Pain Scale: Hurts even more Pain Location: L residual limb with movement. Pain Descriptors / Indicators: Grimacing;Guarding Pain Intervention(s): Monitored during session;Repositioned    Home Living                      Prior Function            PT Goals (current goals can now be found in the care plan section) Acute Rehab PT Goals Patient Stated Goal: to get stronger Potential to Achieve Goals: Poor Progress towards PT goals: Progressing toward goals    Frequency    Min 3X/week       PT Plan Current plan remains appropriate    Co-evaluation              AM-PAC PT "6 Clicks" Daily Activity  Outcome Measure  Difficulty turning over in bed (including adjusting bedclothes, sheets and blankets)?: A Lot Difficulty moving from lying on back to sitting on the side of the bed? : Unable Difficulty sitting down on and standing up from a chair with arms (e.g., wheelchair, bedside commode, etc,.)?: Unable Help needed moving to and from a bed to chair (including a wheelchair)?: Total Help needed walking in hospital room?: Total Help needed climbing 3-5 steps with a railing? : Total 6 Click Score: 7    End of Session Equipment Utilized During Treatment: Gait belt Activity Tolerance: Patient limited by pain Patient left: in chair;with call bell/phone within reach Nurse Communication: Mobility status PT Visit Diagnosis: Muscle weakness (generalized) (M62.81);Other abnormalities of gait and mobility (R26.89);Pain Pain - Right/Left: Left Pain - part of body: Leg     Time: 1610-9604 PT Time Calculation (min) (ACUTE ONLY): 31 min  Charges:  $Therapeutic Exercise: 8-22 mins $Therapeutic Activity: 8-22 mins                     Joycelyn Rua, PTA pager [phone]    Florestine Avers 01/16/2018, 5:22 PM

## 2018-01-16 NOTE — Discharge Summary (Signed)
[facility]  Physician Discharge Summary   Patient ID: Kevin Whitney MRN: [id number] DOB/AGE: June 08, 1960 57 y.o.  Admit date: 01/05/2018 Discharge date: 01/16/2018  PCP: [name], MD  DISCHARGE DIAGNOSES:  Left foot gangrene secondary to peripheral vascular disease status post amputation MRSA bacteremia Sacral decubitus status post debridement Normocytic anemia   RECOMMENDATIONS FOR OUTPATIENT FOLLOW UP: 1. Follow up with PCP in 1-2 weeks 2. Please obtain BMP/CBC in one week 3. Needs follow up with wound care team.  4. Follow up with Dr Lajoyce Corners in 1 week.  5. He will need 2 more weeks of IV vancomycin. Needs follow up blood culture when he finished treatment.  6. Needs repeat ACTH/Cortisol level in 3 to 4 weeks.  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 Further work-up showed MRSA bacteremia, likely source his sacral ulcer. Pt was also found to have LLE gangrene, CT arteriogram showed severe PVD, and was subsequently transferred to [facility] for AKA by Dr Lajoyce Corners. General surgery was also consulted for his multiple ulcers.   Consultations:  Dr. Lajoyce Corners  Phone conversation with infectious disease   HOSPITAL COURSE:   Left Foot gangrene 2/2 PVD S/P AKA wound vac by Dr Lajoyce Corners 8-25. Arteriogram studies shows complete occlusion of the popliteal fossa on the left.  Wound VAC was placed.  Wound VAC was subsequently  removed on 9/30.  Patient will need to go to skilled nursing facility for short-term rehab.  Follow-up with Dr. Lajoyce Corners in 1 week.  Continue aspirin and statin.  Sacral Decubitus S/P debridement on 01/07/18. Was seen by general surgery.  Wound VAC removed.  Wound care.  MRSA Bacteremia  Required IV pressors on admission at [place].  BC at [place] grew MRSA. Repeated Blood culture; negative TTE and TEE done at [facility] are both unremarkable Case was discussed by Dr Sharolyn [name] with Dr Orvan Falconer, and patient will need 3 Weeks of IV vancomycin from 8/26.  End date will be 9/15. Has Right IJ lumen tunneled placed by IR.   Hypotension/low cortisol Lactic acid normal.  Cortisol low normal, ACTH cortisol 30 -60 minutes at 12--18. Discussed with endocrinologist, who recommend repeating test outpatient. No need to start hydrocortisone at this time  Received one unit PRBC Stable. Discharge on midodrine.   Anemia; hb trending down.  Anemia panel reviewed.  Ferritin 172.  TIBC 111.  Iron 44.  B12 373.  Folate 7.0.  Continue with oral iron supplementation.  He was also given blood transfusion.  Hemoglobin is stable. Stool occult ordered and is negative.   Hypoglycemia Resolved  Proteus UTI Treated with keflex.   Chronic diastolic HF Stable.   Severe protein calorie malnutrition  Nutritional supplements.  Encourage oral intake.    Chronic pain syndrome/narcotic dependence Continue pain management with home fentanyl patch   Overall stable.  Okay for discharge to skilled nursing facility when insurance authorization is available.   PERTINENT LABS:  The results of significant diagnostics from  this hospitalization (including imaging, microbiology, ancillary and laboratory) are listed below for reference.    Microbiology: Recent Results (from the past 240 hour(s))  Culture, blood (routine x 2)     Status: None   Collection Time: 01/06/18 12:26 PM  Result Value Ref Range Status    Specimen Description BLOOD LEFT ANTECUBITAL  Final   Special Requests   Final    BOTTLES DRAWN AEROBIC AND ANAEROBIC Blood Culture adequate volume   Culture   Final    NO GROWTH 5 DAYS Performed at [facility], [address], [place], [place] [address]    Report Status 01/11/2018 FINAL  Final  Culture, blood (routine x 2)     Status: None   Collection Time: 01/06/18 12:26 PM  Result Value Ref Range Status   Specimen Description BLOOD RIGHT ANTECUBITAL  Final   Special Requests   Final    BOTTLES DRAWN AEROBIC AND ANAEROBIC Blood Culture adequate volume   Culture   Final    NO GROWTH 5 DAYS Performed at [facility], [address] [place], [place] [address]    Report Status 01/11/2018 FINAL  Final  Aerobic/Anaerobic Culture (surgical/deep wound)     Status: None   Collection Time: 01/07/18  1:25 PM  Result Value Ref Range Status   Specimen Description ABSCESS  Final   Special Requests SACRAL DECUBITUS ULCER  Final   Gram Stain   Final    ABUNDANT WBC PRESENT, PREDOMINANTLY PMN FEW GRAM POSITIVE COCCI    Culture   Final    MODERATE METHICILLIN RESISTANT STAPHYLOCOCCUS AUREUS NO ANAEROBES ISOLATED Performed at [facility], [address] [place], [place] [address]    Report Status 01/12/2018 FINAL  Final   Organism ID, Bacteria METHICILLIN RESISTANT STAPHYLOCOCCUS AUREUS  Final      Susceptibility   Methicillin resistant staphylococcus aureus - MIC*    CIPROFLOXACIN >=8 RESISTANT Resistant     ERYTHROMYCIN >=8 RESISTANT Resistant     GENTAMICIN <=0.5 SENSITIVE Sensitive     OXACILLIN >=4 RESISTANT Resistant     TETRACYCLINE <=1 SENSITIVE Sensitive     VANCOMYCIN <=0.5 SENSITIVE Sensitive     TRIMETH/SULFA <=10 SENSITIVE Sensitive     CLINDAMYCIN <=0.25 SENSITIVE Sensitive     RIFAMPIN <=0.5 SENSITIVE Sensitive     Inducible Clindamycin NEGATIVE Sensitive     * MODERATE METHICILLIN RESISTANT STAPHYLOCOCCUS AUREUS     Labs: Basic Metabolic  Panel: Recent Labs  Lab 01/10/18 0450 01/11/18 0407 01/13/18 0431 01/14/18 0322  NA 140 140 138 137  K 3.8 3.3* 3.8 3.8  CL 107 109 105 105  CO2 28 28 29 29   GLUCOSE 110* 91 134* 107*  BUN 11 12 14 12   CREATININE 0.48* 0.58* 0.61 0.46*  CALCIUM 7.3* 7.6* 7.8* 7.9*   CBC: Recent Labs  Lab 01/10/18 0450 01/11/18 0407 01/12/18 0357 01/13/18 0431 01/14/18 0322  WBC 14.8* 13.3* 14.6* 13.0* 11.4*  NEUTROABS 10.6* 9.2* 9.9* 9.1* 7.6  HGB 7.9* 7.7* 9.5* 9.2* 9.5*  HCT 26.2* 25.5* 31.0* 29.9* 31.1*  MCV 94.2 95.1 94.2 94.0 95.4  PLT 423* 387 397 357 340    CBG: Recent Labs  Lab 01/12/18 0742 01/13/18 0803 01/14/18 0809 01/15/18 0842 01/16/18 0824  GLUCAP 86 79 103* 94 99     IMAGING STUDIES Ir Fluoro Guide Cv Line Right  Result Date: 01/07/2018 INDICATION: 57 year old male with a history of above knee amputation EXAM: ULTRASOUND-GUIDED TUNNELED CENTRAL CATHETER MEDICATIONS:  None ANESTHESIA/SEDATION: None FLUOROSCOPY TIME:  Fluoroscopy Time: 0 minutes 6 seconds COMPLICATIONS: None PROCEDURE: After written informed consent was obtained, patient was placed in the supine position on angiographic table. Patency of the right internal jugular vein was confirmed with ultrasound with image documentation. Patient was prepped and draped in the usual sterile fashion including the right neck and right superior chest. Using ultrasound guidance, the skin and subcutaneous tissues overlying the right internal jugular vein were generously infiltrated with 1% lidocaine without epinephrine. Using ultrasound guidance, the right internal jugular vein was punctured with a micropuncture needle, and an 018 wire was advanced into the right heart confirming venous access. A small stab incision was made with an 11 blade scalpel. Peel-away sheath was placed over the wire, and then the wire was removed, marking the wire for estimation of internal catheter length. The chest wall was then generously  infiltrated with 1% lidocaine for local anesthesia along the tissue tract. Small stab incision was made with 11 blade scalpel, and then the catheter was back tunneled to the puncture site at the right internal jugular vein. Catheter was pulled through the tract, with the catheter amputated at 23.5 cm cm. Catheter was advanced through the peel-away sheath, and the peel-away sheath was removed. Final image was stored. The catheter was anchored to the chest wall with 2 retention sutures, and Derma bond was used to seal the right internal jugular vein incision site and at the right chest wall. Patient tolerated the procedure well and remained hemodynamically stable throughout. No complications were encountered and no significant blood loss was encountered. IMPRESSION: Status post right IJ tunneled cuffed catheter placement. Catheter ready for use. Signed, Yvone Neu. Reyne Dumas, RPVI [facility] Radiology Electronically Signed   By: Gilmer Mor D.O.   On: 01/07/2018 11:42   Ir US Guide Vasc Access Right  Result Date: 01/07/2018 INDICATION: 57 year old male with a history of above knee amputation EXAM: ULTRASOUND-GUIDED TUNNELED CENTRAL CATHETER MEDICATIONS: None ANESTHESIA/SEDATION: None FLUOROSCOPY TIME:  Fluoroscopy Time: 0 minutes 6 seconds COMPLICATIONS: None PROCEDURE: After written informed consent was obtained, patient was placed in the supine position on angiographic table. Patency of the right internal jugular vein was confirmed with ultrasound with image documentation. Patient was prepped and draped in the usual sterile fashion including the right neck and right superior chest. Using ultrasound guidance, the skin and subcutaneous tissues overlying the right internal jugular vein were generously infiltrated with 1% lidocaine without epinephrine. Using ultrasound guidance, the right internal jugular vein was punctured with a micropuncture needle, and an 018  wire was advanced into the right heart confirming venous access. A small stab incision was made with an 11 blade scalpel. Peel-away sheath was placed over the wire, and then the wire was removed, marking the wire for estimation of internal catheter length. The chest wall was then generously infiltrated with 1% lidocaine for local anesthesia along the tissue tract. Small stab incision was made with 11 blade scalpel, and then the catheter was back tunneled to the puncture site at the right internal jugular vein. Catheter was pulled through the tract, with the catheter amputated at 23.5 cm cm. Catheter was advanced through the peel-away sheath, and the peel-away sheath was removed. Final image was stored. The catheter was anchored to the chest wall with 2 retention sutures, and Derma bond was used to seal the right internal jugular vein incision site and at the right chest wall. Patient tolerated the procedure well and remained  hemodynamically stable throughout. No complications were encountered and no significant blood loss was encountered. IMPRESSION: Status post right IJ tunneled cuffed catheter placement. Catheter ready for use. Signed, Yvone Neu. Reyne Dumas, RPVI [facility] Radiology Electronically Signed   By: Gilmer Mor D.O.   On: 01/07/2018 11:42   Dg Chest Port 1 View  Result Date: 01/05/2018 CLINICAL DATA:  Line placement, preoperative for left above-the-knee amputation EXAM: PORTABLE CHEST 1 VIEW COMPARISON:  01/01/2018 chest radiograph. FINDINGS: Right subclavian central venous catheter terminates in the middle third of the SVC. Spinal fusion hardware overlies the left lower thoracic spine. Stable cardiomediastinal silhouette with normal heart size. No pneumothorax. No pleural effusion. Nodular 1 cm opacity overlying peripheral mid to lower right lung, stable since 01/01/2018 radiograph, new since 03/09/2016 chest CT. No pulmonary edema. Stable mild  scarring versus atelectasis at the left greater than right lung bases. IMPRESSION: 1. Nodular 1 cm opacity overlying the peripheral mid to lower right lung, new since 2017 chest CT. Cannot exclude pulmonary nodule. Recommend further evaluation chest CT. 2. Stable mild scarring at the left lung base. 3. Well-positioned right subclavian central venous catheter. Electronically Signed   By: Delbert Phenix M.D.   On: 01/05/2018 16:52    DISCHARGE EXAMINATION: Vitals:   01/15/18 1435 01/15/18 2047 01/16/18 0420 01/16/18 0643  BP: 106/67 125/79 116/67   Pulse: (!) 105 (!) 104 92   Resp: 16 16 16    Temp: 98.4 F (36.9 C) 99 F (37.2 C) 99 F (37.2 C)   TempSrc:  Oral Oral   SpO2: 100% 99% 97%   Weight:    60.6 kg  Height:       General appearance: alert, cooperative, appears stated age and no distress Resp: clear to auscultation bilaterally Cardio: regular rate and rhythm, S1, S2 normal, no murmur, click, rub or gallop GI: soft, non-tender; bowel sounds normal; no masses,  no organomegaly  DISPOSITION: SNF    Discharge Instructions    Diet - low sodium heart healthy   Complete by:  As directed    Increase activity slowly   Complete by:  As directed         Allergies as of 01/16/2018   No Known Allergies     Medication List    TAKE these medications   aspirin 81 MG EC tablet Take 1 tablet (81 mg total) by mouth daily.   atorvastatin 40 MG tablet Commonly known as:  LIPITOR Take 1 tablet (40 mg total) by mouth daily at 6 PM.   cyclobenzaprine 10 MG tablet Commonly known as:  FLEXERIL Take 1 tablet (10 mg total) by mouth 3 (three) times daily as needed for muscle spasms.   DESITIN 40 % ointment Generic drug:  liver oil-zinc oxide Apply 1 application topically as needed for irritation.   diazepam 10 MG tablet Commonly known as:  VALIUM Take 1 tablet (10 mg total) by mouth every 12 (twelve) hours as needed for anxiety.   docusate sodium 100 MG capsule Commonly known as:   COLACE Take 1 capsule (100 mg total) by mouth 2 (two) times daily.   famotidine 20 MG tablet Commonly known as:  PEPCID Take 1 tablet (20 mg total) by mouth daily.   feeding supplement (ENSURE ENLIVE) Liqd Take 237 mLs by mouth 3 (three) times daily between meals.   feeding supplement (PRO-STAT SUGAR FREE 64) Liqd Take 30 mLs by mouth 2 (two) times daily.   fentaNYL 75 MCG/HR Commonly  known as:  DURAGESIC - dosed mcg/hr Place 1 patch (75 mcg total) onto the skin every 3 (three) days. Start taking on:  01/17/2018 What changed:  when to take this   ferrous sulfate 325 (65 FE) MG tablet Take 1 tablet (325 mg total) by mouth 2 (two) times daily with a meal.   midodrine 5 MG tablet Commonly known as:  PROAMATINE Take 1 tablet (5 mg total) by mouth 3 (three) times daily with meals.   multivitamin with minerals Tabs tablet Take 1 tablet by mouth daily.   Oxycodone HCl 10 MG Tabs Take 1 tablet (10 mg total) by mouth every 3 (three) hours as needed.   polyethylene glycol packet Commonly known as:  MIRALAX / GLYCOLAX Take 17 g by mouth 2 (two) times daily.   vancomycin 1-5 GM/200ML-% Soln Commonly known as:  VANCOCIN Inject 200 mLs (1,000 mg total) into the vein every 12 (twelve) hours.         Contact information for follow-up providers    Nadara Mustard, MD In 1 week.   Specialty:  Orthopedic Surgery Contact information: [address] [place] [address] [phone]        [facility]             . Call.   Why:  Call and schedule an appointment in a few weeks when discharged from SNF to check sacral decubitus wound.  Contact information: [address] [place] [phone]           Contact information for after-discharge care    Destination    [facility]erred [facility]F .   Service:  Skilled Nursing Contact information: [address] [place]  [place] [address] [phone]                  TOTAL DISCHARGE TIME: 35 mins  Osvaldo Shipper  [facility] Pager [phone]  01/16/2018, 11:22 AM

## 2018-01-16 NOTE — Care Management Important Message (Signed)
Important Message  Patient Details  Name: Kevin Whitney MRN: [id number] Date of Birth: 25-Nov-1960   Medicare Important Message Given:  Yes    Lawerance Sabal, RN 01/16/2018, 10:41 AM

## 2018-01-17 LAB — BASIC METABOLIC PANEL
ANION GAP: 7 (ref 5–15)
BUN: 6 mg/dL (ref 6–20)
CHLORIDE: 106 mmol/L (ref 98–111)
CO2: 26 mmol/L (ref 22–32)
CREATININE: 0.44 mg/dL — AB (ref 0.61–1.24)
Calcium: 8.4 mg/dL — ABNORMAL LOW (ref 8.9–10.3)
GFR calc non Af Amer: >60 mL/min (ref >60)
Glucose, Bld: 100 mg/dL — ABNORMAL HIGH (ref 70–99)
POTASSIUM: 3.7 mmol/L (ref 3.5–5.1)
Sodium: 139 mmol/L (ref 135–145)

## 2018-01-17 LAB — VANCOMYCIN, TROUGH: Vancomycin Tr: 16 ug/mL (ref 15–20)

## 2018-01-17 LAB — GLUCOSE, CAPILLARY: GLUCOSE-CAPILLARY: 91 mg/dL (ref 70–99)

## 2018-01-17 MED ORDER — HEPARIN SOD (PORK) LOCK FLUSH 100 UNIT/ML IV SOLN
250.0000 [IU] | INTRAVENOUS | Status: AC | PRN
  Administered 2018-01-17: 250 [IU]

## 2018-01-17 NOTE — Social Work (Signed)
Clinical Social Worker facilitated patient discharge including contacting patient family and facility to confirm patient discharge plans.  Clinical information faxed to facility and family agreeable with plan.  CSW arranged ambulance transport via [facility] to [facility].  RN to call [phone] with report  prior to discharge.  Clinical Social Worker will sign off for now as social work intervention is no longer needed. Please consult [place] again if new need arises.  Doy Hutching, [place] Clinical Social Worker [phone]

## 2018-01-17 NOTE — Progress Notes (Signed)
Patient could not go to skilled nursing facility yesterday due to insurance issues.  Apparently he is going better today.  Patient seen and examined.  No new complaints.  Vital signs remain stable.  No change in examination.  Assessment plan as noted in the discharge summary dated 01/16/2018.  Okay for discharge today.  Osvaldo Shipper 01/17/2018

## 2018-01-17 NOTE — Clinical Social Work Placement (Signed)
   CLINICAL SOCIAL WORK PLACEMENT  NOTE  [facility]  Date:  01/17/2018  Patient Details  Name: Kevin Whitney MRN: [id number] Date of Birth: 1960/11/15  Clinical Social Work is seeking post-discharge placement for this patient at the Skilled  Nursing Facility level of care (*CSW will initial, date and re-position this form in  chart as items are completed):  Yes   Patient/family provided with [place] Clinical Social Work Department's list of facilities offering this level of care within the geographic area requested by the patient (or if unable, by the patient's family).  Yes   Patient/family informed of their freedom to choose among providers that offer the needed level of care, that participate in Medicare, Medicaid or managed care program needed by the patient, have an available bed and are willing to accept the patient.  Yes   Patient/family informed of [place]'s ownership interest in [facility] and [facility], as well as of the fact that they are under no obligation to receive care at these facilities.  PASRR submitted to EDS on       PASRR number received on       Existing PASRR number confirmed on 01/07/18     FL2 transmitted to all facilities in geographic area requested by pt/family on 01/07/18     FL2 transmitted to all facilities within larger geographic area on       Patient informed that his/her managed care company has contracts with or will negotiate with certain facilities, including the following:        Yes   Patient/family informed of bed offers received.  Patient chooses bed at [facility]     Physician recommends and patient chooses bed at      Patient to be transferred to [facility] on 01/17/18.  Patient to be transferred to facility by [facility]     Patient family notified on 01/17/18 of transfer.  Name of family member notified:  pt father     PHYSICIAN       Additional Comment:     _______________________________________________ Doy Hutching, LCSWA 01/17/2018, 9:56 AM

## 2018-01-17 NOTE — Progress Notes (Signed)
Patient discharged to [facility] nursing facility. Right IJ in situ, IV team RN changed dressing .Report given to [facility].

## 2018-01-17 NOTE — Progress Notes (Signed)
Pharmacy Antibiotic Note  Kevin Whitney is a 57 y.o. male transferred from [place] on 01/05/2018 with MRSA bacteremia and MRSA in sacral wound. TEE negative for IE at [facility].  Pharmacy has been consulted to continue vancomycin and ID recommended 3 weeks of therapy.  Renal function is stable and repeat vancomycin trough remains therapeutic.  Afebrile, WBC trending down.   Plan: Continue vanc 1gm IV Q12H Monitor renal fxn, clinical progress, weekly vanc trough    Height: 6\' 1"  (185.4 cm) Weight: 133 lb 8 oz (60.6 kg) IBW/kg (Calculated) : 79.9  Temp (24hrs), Avg:98.4 F (36.9 C), Min:97.7 F (36.5 C), Max:98.8 F (37.1 C)  Recent Labs  Lab 01/11/18 0407 01/11/18 1030 01/12/18 0357 01/13/18 0431 01/14/18 0322 01/17/18 0931  WBC 13.3*  --  14.6* 13.0* 11.4*  --   CREATININE 0.58*  --   --  0.61 0.46* 0.44*  VANCOTROUGH  --  17  --   --   --  16    Estimated Creatinine Clearance: 89.4 mL/min (A) (by C-G formula based on SCr of 0.44 mg/dL (L)).    No Known Allergies   Vanc (PTA at [facility]) >>  Rocephin/Keflex/?ancef for protein UTI (started?) >> 8/25  [place]: Vanc 1500mg /18>>Trough 11.6>>incr to 1g/12 hr; then 8/23 trough 15.7 8/30 VT = 17 on 1g IV q12h  >> no change 9/5 VT = 16 mcg/mL >> no change  [place] 8/20: MRSA bacteremia [facility] 8/20: SacraL wound: MRSA [place] urine: Proteus 8/25 BCx - negative 8/26 sacral decub abscess - MRSA   [name] D. Laney Potash, PharmD, BCPS, BCCCP 01/17/2018, 10:41 AM

## 2018-01-17 NOTE — Progress Notes (Signed)
Sacral wound clean without need for further surgical debridement. VAC removed and patient transitioned to BID wet-to-dry dressing changes, more frequently as needed for wound contamination. He should continue this wound care at [facility]. CCS will sign off. Call as needed with questions/concerns. Follow up at wound care center.      Hosie Spangle, PA-C [facility] [place] [facility] Pager: [phone]

## 2018-01-23 ENCOUNTER — Encounter (INDEPENDENT_AMBULATORY_CARE_PROVIDER_SITE_OTHER): Payer: Self-pay | Admitting: Physician Assistant

## 2018-01-23 ENCOUNTER — Ambulatory Visit (INDEPENDENT_AMBULATORY_CARE_PROVIDER_SITE_OTHER): Payer: Medicare Other | Admitting: Physician Assistant

## 2018-01-23 DIAGNOSIS — E43 Unspecified severe protein-calorie malnutrition: Secondary | ICD-10-CM

## 2018-01-23 DIAGNOSIS — G8220 Paraplegia, unspecified: Secondary | ICD-10-CM

## 2018-01-23 DIAGNOSIS — Z89612 Acquired absence of left leg above knee: Secondary | ICD-10-CM

## 2018-01-23 NOTE — Progress Notes (Signed)
Office Visit Note   Patient: Kevin Whitney           Date of Birth: [birth year]           MRN: [id number] Visit Date: 01/23/2018              Requested by: [name], MD [address] [facility] [place], [place] [address] PCP: [name], MD  Chief Complaint  Patient presents with  . 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  He also had a stage IV sacral decubitus which was treated by general surgery during this admission.  He is now a resident at skilled nursing facility in the [place] area, [facility].  He reports that he is been doing well since discharge to skilled nursing.  He reports he is continuing to take at least one protein supplement daily.  He had a VAC dressing on while he was hospitalized but this was discontinued just prior to his discharge to the skilled nursing facility.  He reports minimal discomfort in the amputation site.  He is still taking some oxycodone for pain and is on a fentanyl patch which is chronic for him.  Assessment & Plan: Visit Diagnoses:  1. 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 Severe protein-calorie malnutrition (HCC)   3. Paraparesis of both lower limbs (HCC)     Plan:  sutures and staples were harvested this visit.  We are going to have him clean the AKA daily, he may shower and get the area wet and use soap and water to the area and cover the incisional area with 4 x 4's and Ace wrapping for edema control.  I did give him a prescription for [employer] for prosthesis, as a K1 ambulator as he can use the prosthesis for transfers and perhaps for some standing.  He will follow-up here in 2 weeks. Follow-Up Instructions: Return in about 2 weeks (around  02/06/2018).   Ortho Exam  Patient is alert, oriented, no adenopathy, well-dressed, normal affect, normal respiratory effort. Left above-the-knee amputations incision is healing well.  There is scant drainage.  No signs of cellulitis.  Minimal edema.  Patient presents at the wheelchair level.  He does have some movement in his right lower extremity and does use the right lower extremity for transfers.  He does report that he had some limited ambulatory ability prior to his most recent hospitalization.  Imaging: No results found. No images are attached to the encounter.  Labs: Lab Results  Component Value Date   REPTSTATUS 01/12/2018 FINAL 01/07/2018   GRAMSTAIN  01/07/2018    ABUNDANT WBC PRESENT, PREDOMINANTLY PMN FEW GRAM POSITIVE COCCI    CULT  01/07/2018    MODERATE METHICILLIN RESISTANT STAPHYLOCOCCUS AUREUS NO ANAEROBES ISOLATED Performed at [facility], [address] [place], [place] [address]    LABORGA METHICILLIN RESISTANT STAPHYLOCOCCUS AUREUS 01/07/2018     Lab Results  Component Value Date   ALBUMIN 2.2 (L) 02/09/2011   ALBUMIN 3.0 (L) 02/07/2011    There is no height or weight on file to calculate BMI.  Orders:  No orders of the defined types were placed in this encounter.  No orders of the defined types were  placed in this encounter.    Procedures: No procedures performed  Clinical Data: No additional findings.  ROS:  All other systems negative, except as noted in the HPI. Review of Systems  Objective: Vital Signs: There were no vitals taken for this visit.  Specialty Comments:  No specialty comments available.  PMFS History: Patient Active Problem List   Diagnosis Date Noted  . Bacteremia   . Pressure injury of skin of sacral region   . DDD (degenerative disc disease), lumbosacral   . Chronic pain syndrome   . Leukocytosis   . Acute blood loss anemia   . Left buttock abscess 01/05/2018  . Stage III pressure ulcer of buttock  (HCC) 01/05/2018  . MRSA bacteremia 01/05/2018  . Atherosclerosis of native artery of left lower extremity with gangrene (HCC) 01/05/2018  . Antibiotic-associated diarrhea 01/05/2018  . Proteus infection 01/05/2018  . Acute lower UTI 01/05/2018  . Pressure ulcer of left upper back, stage 3 (HCC) 01/05/2018  . Hypokalemia 01/05/2018  . H/O degenerative disc disease 01/05/2018  . Chronic narcotic dependence (HCC) 01/05/2018  . Disuse atrophy of muscle 01/05/2018  . Tobacco abuse counseling 01/05/2018  . Right middle lobe pulmonary nodule 01/05/2018  . Chronic diastolic CHF (congestive heart failure) (HCC) 01/05/2018  . Non-healing ulcer of groin with fat layer exposed (HCC) 01/05/2018  . Gangrene of left foot (HCC) 01/05/2018  . Severe protein-calorie malnutrition (HCC)   . DDD (degenerative disc disease), lumbar 11/02/2015  . Facet syndrome, lumbar 11/02/2015  . Sacroiliac joint dysfunction 11/02/2015   Past Medical History:  Diagnosis Date  . Acute blood loss anemia 12/2017  . Bacteremia   . Broken legs   . CHF (congestive heart failure) (HCC)   . Chronic pain   . Chronic pain syndrome   . Complication of anesthesia    " i hallucinate "  . DDD (degenerative disc disease), lumbar   . 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 MRSA (methicillin resistant staph aureus) culture positive    2003   . Pressure injury of skin of sacral region   . Protein calorie malnutrition (HCC)   . Staph infection   . UTI (urinary tract infection) 12/2017    Family History  Problem Relation Age of Onset  . Arthritis Father     Past Surgical History:  Procedure Laterality Date  . AMPUTATION Left 01/06/2018   Procedure: AMPUTATION ABOVE KNEE;  Surgeon: Nadara Mustard, MD;  Location: [facility] OR;  Service: Orthopedics;  Laterality: Left;  . BACK SURGERY    . DEBRIDMENT OF DECUBITUS ULCER N/A 01/07/2018   Procedure: DEBRIDMENT OF DECUBITUS ULCER;  Surgeon: Manus Rudd, MD;  Location: [facility] OR;  Service: General;  Laterality: N/A;  . FEMUR SURGERY     rt rod  . IR FLUORO GUIDE CV LINE RIGHT  01/07/2018  . IR US GUIDE VASC ACCESS RIGHT  01/07/2018  . surgery every 3 wks for mrsa    . WRIST SURGERY     bilaterally   Social History   Occupational History  . Not on file  Tobacco Use  . Smoking status: Former Smoker    Packs/day: 0.75    Types: Cigarettes    Last attempt to quit: 01/06/2018    Years since quitting: 0.0  . Smokeless tobacco: Never Used  Substance and Sexual Activity  . Alcohol use: No    Alcohol/week: 0.0 standard drinks  . Drug use: No  . Sexual activity:  Not on file

## 2018-02-06 ENCOUNTER — Ambulatory Visit (INDEPENDENT_AMBULATORY_CARE_PROVIDER_SITE_OTHER): Payer: Medicare Other | Admitting: Physician Assistant

## 2018-02-06 ENCOUNTER — Encounter (INDEPENDENT_AMBULATORY_CARE_PROVIDER_SITE_OTHER): Payer: Self-pay | Admitting: Physician Assistant

## 2018-02-06 VITALS — Ht 73.0 in | Wt 133.5 lb

## 2018-02-06 DIAGNOSIS — E43 Unspecified severe protein-calorie malnutrition: Secondary | ICD-10-CM

## 2018-02-06 DIAGNOSIS — G8220 Paraplegia, unspecified: Secondary | ICD-10-CM

## 2018-02-06 DIAGNOSIS — Z89612 Acquired absence of left leg above knee: Secondary | ICD-10-CM

## 2018-02-06 NOTE — Progress Notes (Signed)
Office Visit Note   Patient: Kevin Whitney           Date of Birth: [birth year]           MRN: [id number] Visit Date: 02/06/2018              Requested by: [name], MD [address] [facility] [place], [place] [address] PCP: [name], MD  Chief Complaint  Patient presents with  . Left Leg - Routine Post Op    BKA      HPI: Patient is a 57 year old male who was seen for postoperative follow-up following left above the knee amputation on 01/06/2018.  He is currently in rehab at [facility] skilled nursing facility.  The patient reports that he is likely can be discharged home.  He reports he continues to take at least one protein supplement daily.  He is taking some gabapentin for phantom pain as well as medications for his spasticity following his spinal cord injury.  He has been working with [employer] and obtained stump shrinker stockings.  He reports he is not sure if he will proceed with a prosthesis as he had limited ability to ambulate prior to the amputation.  He does feel however that the prosthesis might help him with transfers.  Assessment & Plan: Visit Diagnoses:  1. 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 Severe protein-calorie malnutrition (HCC)   3. Paraparesis of both lower limbs (HCC)     Plan: The patient will continue with his stump shrinker stocking.  He will follow-up with [employer] concerning obtaining a prosthesis.  He will follow-up here in 4 weeks or sooner should he have any difficulties in the interim.  Follow-Up Instructions: Return in about 4 weeks (around 03/06/2018).   Ortho Exam  Patient is alert, oriented, no adenopathy, well-dressed, normal affect, normal respiratory effort. The left knee amputation is healing well and without signs of cellulitis.  There is minimal edema.  Imaging: No results found. No images are attached to the encounter.  Labs: Lab Results  Component Value Date   REPTSTATUS 01/12/2018  FINAL 01/07/2018   GRAMSTAIN  01/07/2018    ABUNDANT WBC PRESENT, PREDOMINANTLY PMN FEW GRAM POSITIVE COCCI    CULT  01/07/2018    MODERATE METHICILLIN RESISTANT STAPHYLOCOCCUS AUREUS NO ANAEROBES ISOLATED Performed at [facility], [address] [place], [place] [address]    LABORGA METHICILLIN RESISTANT STAPHYLOCOCCUS AUREUS 01/07/2018     Lab Results  Component Value Date   ALBUMIN 2.2 (L) 02/09/2011   ALBUMIN 3.0 (L) 02/07/2011    Body mass index is 17.61 kg/m.  Orders:  No orders of the defined types were placed in this encounter.  No orders of the defined types were placed in this encounter.    Procedures: No procedures performed  Clinical Data: No additional findings.  ROS:  All other systems negative, except as noted in the HPI. Review of Systems  Objective: Vital Signs: Ht 6\' 1"  (1.854 m)   Wt 133 lb 8 oz (60.6 kg)   BMI 17.61 kg/m   Specialty Comments:  No specialty comments available.  PMFS History: Patient Active Problem List   Diagnosis Date Noted  . Bacteremia   . Pressure injury of skin of sacral region   . DDD (degenerative disc disease), lumbosacral   . Chronic pain syndrome   . Leukocytosis   . Acute blood loss anemia   . Left buttock abscess 01/05/2018  .  Stage III pressure ulcer of buttock (HCC) 01/05/2018  . MRSA bacteremia 01/05/2018  . Atherosclerosis of native artery of left lower extremity with gangrene (HCC) 01/05/2018  . Antibiotic-associated diarrhea 01/05/2018  . Proteus infection 01/05/2018  . Acute lower UTI 01/05/2018  . Pressure ulcer of left upper back, stage 3 (HCC) 01/05/2018  . Hypokalemia 01/05/2018  . H/O degenerative disc disease 01/05/2018  . Chronic narcotic dependence (HCC) 01/05/2018  . Disuse atrophy of muscle 01/05/2018  . Tobacco abuse counseling 01/05/2018  . Right middle lobe pulmonary nodule 01/05/2018  . Chronic diastolic CHF (congestive heart failure) (HCC) 01/05/2018  . Non-healing  ulcer of groin with fat layer exposed (HCC) 01/05/2018  . Gangrene of left foot (HCC) 01/05/2018  . Severe protein-calorie malnutrition (HCC)   . DDD (degenerative disc disease), lumbar 11/02/2015  . Facet syndrome, lumbar 11/02/2015  . Sacroiliac joint dysfunction 11/02/2015   Past Medical History:  Diagnosis Date  . Acute blood loss anemia 12/2017  . Bacteremia   . Broken legs   . CHF (congestive heart failure) (HCC)   . Chronic pain   . Chronic pain syndrome   . Complication of anesthesia    " i hallucinate "  . DDD (degenerative disc disease), lumbar   . 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 MRSA (methicillin resistant staph aureus) culture positive    2003   . Pressure injury of skin of sacral region   . Protein calorie malnutrition (HCC)   . Staph infection   . UTI (urinary tract infection) 12/2017    Family History  Problem Relation Age of Onset  . Arthritis Father     Past Surgical History:  Procedure Laterality Date  . AMPUTATION Left 01/06/2018   Procedure: AMPUTATION ABOVE KNEE;  Surgeon: Nadara Mustard, MD;  Location: [facility] OR;  Service: Orthopedics;  Laterality: Left;  . BACK SURGERY    . DEBRIDMENT OF DECUBITUS ULCER N/A 01/07/2018   Procedure: DEBRIDMENT OF DECUBITUS ULCER;  Surgeon: Manus Rudd, MD;  Location: [facility] OR;  Service: General;  Laterality: N/A;  . FEMUR SURGERY     rt rod  . IR FLUORO GUIDE CV LINE RIGHT  01/07/2018  . IR US GUIDE VASC ACCESS RIGHT  01/07/2018  . surgery every 3 wks for mrsa    . WRIST SURGERY     bilaterally   Social History   Occupational History  . Not on file  Tobacco Use  . Smoking status: Former Smoker    Packs/day: 0.75    Types: Cigarettes    Last attempt to quit: 01/06/2018    Years since quitting: 0.0  . Smokeless tobacco: Never Used  Substance and Sexual Activity  . Alcohol use: No    Alcohol/week: 0.0 standard drinks  . Drug use: No  . Sexual activity: Not on file

## 2018-03-06 ENCOUNTER — Encounter (INDEPENDENT_AMBULATORY_CARE_PROVIDER_SITE_OTHER): Payer: Self-pay | Admitting: Physician Assistant

## 2018-03-06 ENCOUNTER — Ambulatory Visit (INDEPENDENT_AMBULATORY_CARE_PROVIDER_SITE_OTHER): Payer: Medicare Other | Admitting: Physician Assistant

## 2018-03-06 VITALS — Ht 73.0 in | Wt 133.5 lb

## 2018-03-06 DIAGNOSIS — F1120 Opioid dependence, uncomplicated: Secondary | ICD-10-CM

## 2018-03-06 DIAGNOSIS — E43 Unspecified severe protein-calorie malnutrition: Secondary | ICD-10-CM

## 2018-03-06 DIAGNOSIS — Z89612 Acquired absence of left leg above knee: Secondary | ICD-10-CM

## 2018-03-06 NOTE — Progress Notes (Signed)
Office Visit Note   Patient: Kevin Whitney           Date of Birth: [birth year]           MRN: [id number] Visit Date: 03/06/2018              Requested by: [name], MD [address] [facility] [place], [place] [address] PCP: [name], MD  Chief Complaint  Patient presents with  . Left Leg - Routine Post Op    BKA left leg      HPI: The patient is a 57 yo male who is seen for post operative follow up following Left above the knee amputation on 01/06/2018. He was in skilled nursing facility following his hospitalization, but has returned home over the past week. He has been smoking some following his return home and we discussed the importance of trying to quit smoking.  He has been working with [employer] for his prosthesis in [place]. Wearing stump shrinker stocking except for hygiene and using Shea butter for hydration of skin.   Assessment & Plan: Visit Diagnoses:  1. 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 Severe protein-calorie malnutrition (HCC)   3. 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 Continue protein supplementation. Counseled to work on trying to quit smoking. follow up in 2 months or sooner if difficulties in the interim.   Follow-Up Instructions: Return in about 2 months (around 05/06/2018).   Ortho Exam  Patient is alert, oriented, no adenopathy, well-dressed, normal affect, normal respiratory effort. Patient presents in wheelchair . Left AKA well healed and without signs of cellulitis or infection.  Edema markedly decreased. Mild skin irritation from the shrinker stocking over the medial thigh.  Very dry skin.  Imaging: No results found. No images are attached to the encounter.  Labs: Lab Results  Component Value Date   REPTSTATUS 01/12/2018 FINAL 01/07/2018   GRAMSTAIN  01/07/2018    ABUNDANT WBC PRESENT, PREDOMINANTLY PMN FEW GRAM POSITIVE COCCI      CULT  01/07/2018    MODERATE METHICILLIN RESISTANT STAPHYLOCOCCUS AUREUS NO ANAEROBES ISOLATED Performed at [facility], [address] [place], [place] [address]    LABORGA METHICILLIN RESISTANT STAPHYLOCOCCUS AUREUS 01/07/2018     Lab Results  Component Value Date   ALBUMIN 2.2 (L) 02/09/2011   ALBUMIN 3.0 (L) 02/07/2011    Body mass index is 17.61 kg/m.  Orders:  No orders of the defined types were placed in this encounter.  No orders of the defined types were placed in this encounter.    Procedures: No procedures performed  Clinical Data: No additional findings.  ROS:  All other systems negative, except as noted in the HPI. Review of Systems  Objective: Vital Signs: Ht 6\' 1"  (1.854 m)   Wt 133 lb 8 oz (60.6 kg)   BMI 17.61 kg/m   Specialty Comments:  No specialty comments available.  PMFS History: Patient Active Problem List   Diagnosis Date Noted  . Bacteremia   . Pressure injury of skin of sacral region   . DDD (degenerative disc disease), lumbosacral   . Chronic pain syndrome   . Leukocytosis   . Acute blood loss anemia   . Left buttock abscess 01/05/2018  . Stage III pressure ulcer of buttock (HCC) 01/05/2018  . MRSA bacteremia 01/05/2018  . Atherosclerosis of native artery of left lower extremity with gangrene (  HCC) 01/05/2018  . Antibiotic-associated diarrhea 01/05/2018  . Proteus infection 01/05/2018  . Acute lower UTI 01/05/2018  . Pressure ulcer of left upper back, stage 3 (HCC) 01/05/2018  . Hypokalemia 01/05/2018  . H/O degenerative disc disease 01/05/2018  . Chronic narcotic dependence (HCC) 01/05/2018  . Disuse atrophy of muscle 01/05/2018  . Tobacco abuse counseling 01/05/2018  . Right middle lobe pulmonary nodule 01/05/2018  . Chronic diastolic CHF (congestive heart failure) (HCC) 01/05/2018  . Non-healing ulcer of groin with fat layer exposed (HCC) 01/05/2018  . Gangrene of left foot (HCC) 01/05/2018  . Severe  protein-calorie malnutrition (HCC)   . DDD (degenerative disc disease), lumbar 11/02/2015  . Facet syndrome, lumbar 11/02/2015  . Sacroiliac joint dysfunction 11/02/2015   Past Medical History:  Diagnosis Date  . Acute blood loss anemia 12/2017  . Bacteremia   . Broken legs   . CHF (congestive heart failure) (HCC)   . Chronic pain   . Chronic pain syndrome   . Complication of anesthesia    " i hallucinate "  . DDD (degenerative disc disease), lumbar   . 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 MRSA (methicillin resistant staph aureus) culture positive    2003   . Pressure injury of skin of sacral region   . Protein calorie malnutrition (HCC)   . Staph infection   . UTI (urinary tract infection) 12/2017    Family History  Problem Relation Age of Onset  . Arthritis Father     Past Surgical History:  Procedure Laterality Date  . AMPUTATION Left 01/06/2018   Procedure: AMPUTATION ABOVE KNEE;  Surgeon: Nadara Mustard, MD;  Location: [facility] OR;  Service: Orthopedics;  Laterality: Left;  . BACK SURGERY    . DEBRIDMENT OF DECUBITUS ULCER N/A 01/07/2018   Procedure: DEBRIDMENT OF DECUBITUS ULCER;  Surgeon: Manus Rudd, MD;  Location: [facility] OR;  Service: General;  Laterality: N/A;  . FEMUR SURGERY     rt rod  . IR FLUORO GUIDE CV LINE RIGHT  01/07/2018  . IR US GUIDE VASC ACCESS RIGHT  01/07/2018  . surgery every 3 wks for mrsa    . WRIST SURGERY     bilaterally   Social History   Occupational History  . Not on file  Tobacco Use  . Smoking status: Former Smoker    Packs/day: 0.75    Types: Cigarettes    Last attempt to quit: 01/06/2018    Years since quitting: 0.1  . Smokeless tobacco: Never Used  Substance and Sexual Activity  . Alcohol use: No    Alcohol/week: 0.0 standard drinks  . Drug use: No  . Sexual activity: Not on file

## 2018-03-20 ENCOUNTER — Encounter (HOSPITAL_COMMUNITY): Payer: Self-pay | Admitting: *Deleted

## 2018-03-20 ENCOUNTER — Emergency Department (HOSPITAL_COMMUNITY): Payer: Medicare Other

## 2018-03-20 ENCOUNTER — Inpatient Hospital Stay (HOSPITAL_COMMUNITY)
Admission: EM | Admit: 2018-03-20 | Discharge: 2018-04-02 | DRG: 871 | Disposition: A | Discharge status: Hospice | Payer: Medicare Other | Attending: Internal Medicine | Admitting: Internal Medicine

## 2018-03-20 DIAGNOSIS — R6521 Severe sepsis with septic shock: Secondary | ICD-10-CM | POA: Diagnosis present

## 2018-03-20 DIAGNOSIS — Z66 Do not resuscitate: Secondary | ICD-10-CM | POA: Diagnosis not present

## 2018-03-20 DIAGNOSIS — K219 Gastro-esophageal reflux disease without esophagitis: Secondary | ICD-10-CM | POA: Diagnosis present

## 2018-03-20 DIAGNOSIS — J9600 Acute respiratory failure, unspecified whether with hypoxia or hypercapnia: Secondary | ICD-10-CM | POA: Diagnosis not present

## 2018-03-20 DIAGNOSIS — G9340 Encephalopathy, unspecified: Secondary | ICD-10-CM | POA: Diagnosis not present

## 2018-03-20 DIAGNOSIS — R1310 Dysphagia, unspecified: Secondary | ICD-10-CM

## 2018-03-20 DIAGNOSIS — E875 Hyperkalemia: Secondary | ICD-10-CM | POA: Diagnosis not present

## 2018-03-20 DIAGNOSIS — Z993 Dependence on wheelchair: Secondary | ICD-10-CM

## 2018-03-20 DIAGNOSIS — Z7189 Other specified counseling: Secondary | ICD-10-CM

## 2018-03-20 DIAGNOSIS — R64 Cachexia: Secondary | ICD-10-CM | POA: Diagnosis present

## 2018-03-20 DIAGNOSIS — G894 Chronic pain syndrome: Secondary | ICD-10-CM | POA: Diagnosis present

## 2018-03-20 DIAGNOSIS — Z8614 Personal history of Methicillin resistant Staphylococcus aureus infection: Secondary | ICD-10-CM

## 2018-03-20 DIAGNOSIS — Z9189 Other specified personal risk factors, not elsewhere classified: Secondary | ICD-10-CM | POA: Diagnosis not present

## 2018-03-20 DIAGNOSIS — L89323 Pressure ulcer of left buttock, stage 3: Secondary | ICD-10-CM | POA: Diagnosis present

## 2018-03-20 DIAGNOSIS — R402363 Coma scale, best motor response, obeys commands, at hospital admission: Secondary | ICD-10-CM | POA: Diagnosis present

## 2018-03-20 DIAGNOSIS — J9383 Other pneumothorax: Secondary | ICD-10-CM | POA: Diagnosis not present

## 2018-03-20 DIAGNOSIS — R0602 Shortness of breath: Secondary | ICD-10-CM | POA: Diagnosis not present

## 2018-03-20 DIAGNOSIS — L89152 Pressure ulcer of sacral region, stage 2: Secondary | ICD-10-CM | POA: Diagnosis present

## 2018-03-20 DIAGNOSIS — A419 Sepsis, unspecified organism: Secondary | ICD-10-CM | POA: Diagnosis present

## 2018-03-20 DIAGNOSIS — E876 Hypokalemia: Secondary | ICD-10-CM | POA: Diagnosis present

## 2018-03-20 DIAGNOSIS — M5136 Other intervertebral disc degeneration, lumbar region: Secondary | ICD-10-CM | POA: Diagnosis present

## 2018-03-20 DIAGNOSIS — J0410 Acute tracheitis without obstruction: Secondary | ICD-10-CM | POA: Diagnosis present

## 2018-03-20 DIAGNOSIS — Y95 Nosocomial condition: Secondary | ICD-10-CM | POA: Diagnosis present

## 2018-03-20 DIAGNOSIS — N179 Acute kidney failure, unspecified: Secondary | ICD-10-CM | POA: Diagnosis not present

## 2018-03-20 DIAGNOSIS — T17998A Other foreign object in respiratory tract, part unspecified causing other injury, initial encounter: Secondary | ICD-10-CM | POA: Diagnosis not present

## 2018-03-20 DIAGNOSIS — J9601 Acute respiratory failure with hypoxia: Secondary | ICD-10-CM | POA: Diagnosis present

## 2018-03-20 DIAGNOSIS — T17908D Unspecified foreign body in respiratory tract, part unspecified causing other injury, subsequent encounter: Secondary | ICD-10-CM | POA: Diagnosis not present

## 2018-03-20 DIAGNOSIS — J189 Pneumonia, unspecified organism: Secondary | ICD-10-CM | POA: Diagnosis present

## 2018-03-20 DIAGNOSIS — T17908A Unspecified foreign body in respiratory tract, part unspecified causing other injury, initial encounter: Secondary | ICD-10-CM | POA: Diagnosis not present

## 2018-03-20 DIAGNOSIS — Z888 Allergy status to other drugs, medicaments and biological substances status: Secondary | ICD-10-CM

## 2018-03-20 DIAGNOSIS — L8990 Pressure ulcer of unspecified site, unspecified stage: Secondary | ICD-10-CM

## 2018-03-20 DIAGNOSIS — Z89612 Acquired absence of left leg above knee: Secondary | ICD-10-CM | POA: Diagnosis not present

## 2018-03-20 DIAGNOSIS — Z881 Allergy status to other antibiotic agents status: Secondary | ICD-10-CM

## 2018-03-20 DIAGNOSIS — E43 Unspecified severe protein-calorie malnutrition: Secondary | ICD-10-CM | POA: Diagnosis present

## 2018-03-20 DIAGNOSIS — R6520 Severe sepsis without septic shock: Secondary | ICD-10-CM | POA: Diagnosis not present

## 2018-03-20 DIAGNOSIS — E2740 Unspecified adrenocortical insufficiency: Secondary | ICD-10-CM | POA: Diagnosis present

## 2018-03-20 DIAGNOSIS — G8929 Other chronic pain: Secondary | ICD-10-CM | POA: Diagnosis not present

## 2018-03-20 DIAGNOSIS — F411 Generalized anxiety disorder: Secondary | ICD-10-CM

## 2018-03-20 DIAGNOSIS — Z7982 Long term (current) use of aspirin: Secondary | ICD-10-CM

## 2018-03-20 DIAGNOSIS — F1123 Opioid dependence with withdrawal: Secondary | ICD-10-CM | POA: Diagnosis not present

## 2018-03-20 DIAGNOSIS — J939 Pneumothorax, unspecified: Secondary | ICD-10-CM | POA: Diagnosis not present

## 2018-03-20 DIAGNOSIS — M549 Dorsalgia, unspecified: Secondary | ICD-10-CM | POA: Diagnosis present

## 2018-03-20 DIAGNOSIS — Z4682 Encounter for fitting and adjustment of non-vascular catheter: Secondary | ICD-10-CM

## 2018-03-20 DIAGNOSIS — L89123 Pressure ulcer of left upper back, stage 3: Secondary | ICD-10-CM | POA: Diagnosis present

## 2018-03-20 DIAGNOSIS — L89893 Pressure ulcer of other site, stage 3: Secondary | ICD-10-CM | POA: Diagnosis present

## 2018-03-20 DIAGNOSIS — Z79899 Other long term (current) drug therapy: Secondary | ICD-10-CM

## 2018-03-20 DIAGNOSIS — Z4659 Encounter for fitting and adjustment of other gastrointestinal appliance and device: Secondary | ICD-10-CM

## 2018-03-20 DIAGNOSIS — Z0189 Encounter for other specified special examinations: Secondary | ICD-10-CM

## 2018-03-20 DIAGNOSIS — J9690 Respiratory failure, unspecified, unspecified whether with hypoxia or hypercapnia: Secondary | ICD-10-CM

## 2018-03-20 DIAGNOSIS — A4102 Sepsis due to Methicillin resistant Staphylococcus aureus: Principal | ICD-10-CM | POA: Diagnosis present

## 2018-03-20 DIAGNOSIS — R402143 Coma scale, eyes open, spontaneous, at hospital admission: Secondary | ICD-10-CM | POA: Diagnosis present

## 2018-03-20 DIAGNOSIS — F1320 Sedative, hypnotic or anxiolytic dependence, uncomplicated: Secondary | ICD-10-CM | POA: Diagnosis present

## 2018-03-20 DIAGNOSIS — I5032 Chronic diastolic (congestive) heart failure: Secondary | ICD-10-CM | POA: Diagnosis present

## 2018-03-20 DIAGNOSIS — Z87891 Personal history of nicotine dependence: Secondary | ICD-10-CM

## 2018-03-20 DIAGNOSIS — E785 Hyperlipidemia, unspecified: Secondary | ICD-10-CM | POA: Diagnosis present

## 2018-03-20 DIAGNOSIS — R252 Cramp and spasm: Secondary | ICD-10-CM | POA: Diagnosis present

## 2018-03-20 DIAGNOSIS — Z79891 Long term (current) use of opiate analgesic: Secondary | ICD-10-CM

## 2018-03-20 DIAGNOSIS — M4800 Spinal stenosis, site unspecified: Secondary | ICD-10-CM | POA: Diagnosis present

## 2018-03-20 DIAGNOSIS — J690 Pneumonitis due to inhalation of food and vomit: Secondary | ICD-10-CM | POA: Diagnosis not present

## 2018-03-20 DIAGNOSIS — Z515 Encounter for palliative care: Secondary | ICD-10-CM | POA: Diagnosis not present

## 2018-03-20 DIAGNOSIS — I739 Peripheral vascular disease, unspecified: Secondary | ICD-10-CM | POA: Diagnosis present

## 2018-03-20 DIAGNOSIS — R402253 Coma scale, best verbal response, oriented, at hospital admission: Secondary | ICD-10-CM | POA: Diagnosis present

## 2018-03-20 LAB — CBC WITH DIFFERENTIAL/PLATELET
ABS IMMATURE GRANULOCYTES: 0.07 10*3/uL (ref 0.00–0.07)
Basophils Absolute: 0 10*3/uL (ref 0.0–0.1)
Basophils Relative: 0 %
Eosinophils Absolute: 0.1 10*3/uL (ref 0.0–0.5)
Eosinophils Relative: 0 %
HCT: 45.3 % (ref 39.0–52.0)
HEMOGLOBIN: 13.3 g/dL (ref 13.0–17.0)
Immature Granulocytes: 0 %
Lymphocytes Relative: 5 %
Lymphs Abs: 1 10*3/uL (ref 0.7–4.0)
MCH: 27.2 pg (ref 26.0–34.0)
MCHC: 29.4 g/dL — ABNORMAL LOW (ref 30.0–36.0)
MCV: 92.6 fL (ref 80.0–100.0)
MONO ABS: 0.9 10*3/uL (ref 0.1–1.0)
MONOS PCT: 5 %
NEUTROS ABS: 16.3 10*3/uL — AB (ref 1.7–7.7)
Neutrophils Relative %: 90 %
Platelets: 177 10*3/uL (ref 150–400)
RBC: 4.89 MIL/uL (ref 4.22–5.81)
RDW: 15.6 % — ABNORMAL HIGH (ref 11.5–15.5)
WBC: 18.3 10*3/uL — AB (ref 4.0–10.5)
nRBC: 0 % (ref 0.0–0.2)

## 2018-03-20 LAB — CBC
HEMATOCRIT: 40.4 % (ref 39.0–52.0)
Hemoglobin: 12.1 g/dL — ABNORMAL LOW (ref 13.0–17.0)
MCH: 27.1 pg (ref 26.0–34.0)
MCHC: 30 g/dL (ref 30.0–36.0)
MCV: 90.4 fL (ref 80.0–100.0)
NRBC: 0 % (ref 0.0–0.2)
PLATELETS: 152 10*3/uL (ref 150–400)
RBC: 4.47 MIL/uL (ref 4.22–5.81)
RDW: 15.3 % (ref 11.5–15.5)
WBC: 19.5 10*3/uL — ABNORMAL HIGH (ref 4.0–10.5)

## 2018-03-20 LAB — COMPREHENSIVE METABOLIC PANEL
ALBUMIN: 1.5 g/dL — AB (ref 3.5–5.0)
ALK PHOS: 88 U/L (ref 38–126)
ALT: 16 U/L (ref 0–44)
AST: 21 U/L (ref 15–41)
Anion gap: 6 (ref 5–15)
BUN: 17 mg/dL (ref 6–20)
CALCIUM: 8.1 mg/dL — AB (ref 8.9–10.3)
CO2: 33 mmol/L — AB (ref 22–32)
CREATININE: 0.72 mg/dL (ref 0.61–1.24)
Chloride: 110 mmol/L (ref 98–111)
GFR calc Af Amer: >60 mL/min (ref >60)
GFR calc non Af Amer: >60 mL/min (ref >60)
Glucose, Bld: 97 mg/dL (ref 70–99)
Potassium: 2.5 mmol/L — CL (ref 3.5–5.1)
SODIUM: 149 mmol/L — AB (ref 135–145)
Total Bilirubin: 1.1 mg/dL (ref 0.3–1.2)
Total Protein: 6.1 g/dL — ABNORMAL LOW (ref 6.5–8.1)

## 2018-03-20 LAB — CREATININE, SERUM
Creatinine, Ser: 0.62 mg/dL (ref 0.61–1.24)
GFR calc non Af Amer: >60 mL/min (ref >60)

## 2018-03-20 LAB — I-STAT CG4 LACTIC ACID, ED
LACTIC ACID, VENOUS: 1.28 mmol/L (ref 0.5–1.9)
Lactic Acid, Venous: 2.03 mmol/L (ref 0.5–1.9)

## 2018-03-20 LAB — BASIC METABOLIC PANEL
Anion gap: 4 — ABNORMAL LOW (ref 5–15)
BUN: 16 mg/dL (ref 6–20)
CALCIUM: 7.1 mg/dL — AB (ref 8.9–10.3)
CO2: 29 mmol/L (ref 22–32)
Chloride: 117 mmol/L — ABNORMAL HIGH (ref 98–111)
Creatinine, Ser: 0.55 mg/dL — ABNORMAL LOW (ref 0.61–1.24)
GFR calc Af Amer: >60 mL/min (ref >60)
GLUCOSE: 96 mg/dL (ref 70–99)
Potassium: 2.3 mmol/L — CL (ref 3.5–5.1)
Sodium: 150 mmol/L — ABNORMAL HIGH (ref 135–145)

## 2018-03-20 LAB — TROPONIN I: TROPONIN I: 0.03 ng/mL — AB (ref <0.03)

## 2018-03-20 LAB — LACTIC ACID, PLASMA: Lactic Acid, Venous: 1.4 mmol/L (ref 0.5–1.9)

## 2018-03-20 LAB — MRSA PCR SCREENING: MRSA by PCR: POSITIVE — AB

## 2018-03-20 LAB — MAGNESIUM: Magnesium: 1.9 mg/dL (ref 1.7–2.4)

## 2018-03-20 LAB — GLUCOSE, CAPILLARY: Glucose-Capillary: 98 mg/dL (ref 70–99)

## 2018-03-20 LAB — PROTIME-INR
INR: 1.86
Prothrombin Time: 21.2 seconds — ABNORMAL HIGH (ref 11.4–15.2)

## 2018-03-20 LAB — BRAIN NATRIURETIC PEPTIDE: B Natriuretic Peptide: 173.8 pg/mL — ABNORMAL HIGH (ref 0.0–100.0)

## 2018-03-20 MED ORDER — HYDROCORTISONE NA SUCCINATE PF 100 MG IJ SOLR
100.0000 mg | Freq: Once | INTRAMUSCULAR | Status: AC
  Administered 2018-03-20: 100 mg via INTRAVENOUS
  Filled 2018-03-20: qty 2

## 2018-03-20 MED ORDER — SODIUM CHLORIDE 0.9 % IV BOLUS (SEPSIS)
1000.0000 mL | Freq: Once | INTRAVENOUS | Status: AC
  Administered 2018-03-20: 1000 mL via INTRAVENOUS

## 2018-03-20 MED ORDER — DIAZEPAM 5 MG PO TABS: 10.0000 mg | ORAL_TABLET | Freq: Two times a day (BID) | ORAL | Status: DC | PRN

## 2018-03-20 MED ORDER — ORAL CARE MOUTH RINSE
15.0000 mL | Freq: Two times a day (BID) | OROMUCOSAL | Status: DC
  Administered 2018-03-20 – 2018-03-22 (×3): 15 mL via OROMUCOSAL

## 2018-03-20 MED ORDER — ASPIRIN EC 81 MG PO TBEC
81.0000 mg | DELAYED_RELEASE_TABLET | Freq: Every day | ORAL | Status: DC
  Administered 2018-03-20 – 2018-03-23 (×4): 81 mg via ORAL
  Filled 2018-03-20 (×4): qty 1

## 2018-03-20 MED ORDER — ENSURE ENLIVE PO LIQD
237.0000 mL | Freq: Three times a day (TID) | ORAL | Status: DC
  Administered 2018-03-22: 237 mL via ORAL

## 2018-03-20 MED ORDER — VANCOMYCIN HCL 10 G IV SOLR
1250.0000 mg | Freq: Once | INTRAVENOUS | Status: AC
  Administered 2018-03-20: 1250 mg via INTRAVENOUS
  Filled 2018-03-20: qty 1250

## 2018-03-20 MED ORDER — SODIUM CHLORIDE 0.9 % IV BOLUS
500.0000 mL | Freq: Once | INTRAVENOUS | Status: AC
  Administered 2018-03-20: 500 mL via INTRAVENOUS

## 2018-03-20 MED ORDER — ATORVASTATIN CALCIUM 40 MG PO TABS
40.0000 mg | ORAL_TABLET | Freq: Every day | ORAL | Status: DC
  Administered 2018-03-20 – 2018-03-28 (×7): 40 mg via ORAL
  Filled 2018-03-20 (×7): qty 1

## 2018-03-20 MED ORDER — HYDROCORTISONE NA SUCCINATE PF 100 MG IJ SOLR
50.0000 mg | Freq: Four times a day (QID) | INTRAMUSCULAR | Status: DC
  Administered 2018-03-20 – 2018-03-22 (×7): 50 mg via INTRAVENOUS
  Filled 2018-03-20 (×8): qty 2

## 2018-03-20 MED ORDER — MIDODRINE HCL 5 MG PO TABS
5.0000 mg | ORAL_TABLET | Freq: Three times a day (TID) | ORAL | Status: DC
  Administered 2018-03-20 – 2018-03-23 (×9): 5 mg via ORAL
  Filled 2018-03-20 (×9): qty 1

## 2018-03-20 MED ORDER — PRO-STAT SUGAR FREE PO LIQD
30.0000 mL | Freq: Two times a day (BID) | ORAL | Status: DC
  Administered 2018-03-21 – 2018-03-22 (×2): 30 mL via ORAL
  Filled 2018-03-20 (×3): qty 30

## 2018-03-20 MED ORDER — SODIUM CHLORIDE 0.9 % IV SOLN
2.0000 g | INTRAVENOUS | Status: DC
  Administered 2018-03-20: 2 g via INTRAVENOUS
  Filled 2018-03-20: qty 20

## 2018-03-20 MED ORDER — SODIUM CHLORIDE 0.9 % IV SOLN
500.0000 mg | INTRAVENOUS | Status: DC
  Administered 2018-03-20: 500 mg via INTRAVENOUS
  Filled 2018-03-20: qty 500

## 2018-03-20 MED ORDER — HEPARIN SODIUM (PORCINE) 5000 UNIT/ML IJ SOLN
5000.0000 [IU] | Freq: Three times a day (TID) | INTRAMUSCULAR | Status: DC
  Administered 2018-03-20 – 2018-03-29 (×26): 5000 [IU] via SUBCUTANEOUS
  Filled 2018-03-20 (×25): qty 1

## 2018-03-20 MED ORDER — FENTANYL 50 MCG/HR TD PT72: 50.0000 ug | MEDICATED_PATCH | TRANSDERMAL | Status: DC

## 2018-03-20 MED ORDER — ACETAMINOPHEN 325 MG PO TABS: 650.0000 mg | ORAL_TABLET | ORAL | Status: DC | PRN

## 2018-03-20 MED ORDER — ONDANSETRON HCL 4 MG/2ML IJ SOLN
4.0000 mg | Freq: Four times a day (QID) | INTRAMUSCULAR | Status: DC | PRN
  Administered 2018-03-29: 4 mg via INTRAVENOUS
  Filled 2018-03-20: qty 2

## 2018-03-20 MED ORDER — POTASSIUM CHLORIDE 10 MEQ/100ML IV SOLN
10.0000 meq | INTRAVENOUS | Status: AC
  Administered 2018-03-20 – 2018-03-21 (×6): 10 meq via INTRAVENOUS
  Filled 2018-03-20 (×6): qty 100

## 2018-03-20 MED ORDER — CYCLOBENZAPRINE HCL 10 MG PO TABS
10.0000 mg | ORAL_TABLET | Freq: Three times a day (TID) | ORAL | Status: DC | PRN
  Administered 2018-03-21 – 2018-03-28 (×4): 10 mg via ORAL
  Filled 2018-03-20 (×4): qty 1

## 2018-03-20 MED ORDER — LACTATED RINGERS IV SOLN
INTRAVENOUS | Status: DC
  Administered 2018-03-20 – 2018-03-22 (×3): via INTRAVENOUS

## 2018-03-20 MED ORDER — VANCOMYCIN HCL IN DEXTROSE 1-5 GM/200ML-% IV SOLN
1000.0000 mg | Freq: Once | INTRAVENOUS | Status: DC
  Filled 2018-03-20: qty 200

## 2018-03-20 MED ORDER — POLYETHYLENE GLYCOL 3350 17 G PO PACK
17.0000 g | PACK | Freq: Two times a day (BID) | ORAL | Status: DC
  Administered 2018-03-22 – 2018-03-27 (×5): 17 g via ORAL
  Filled 2018-03-20 (×12): qty 1

## 2018-03-20 MED ORDER — FAMOTIDINE 20 MG PO TABS
20.0000 mg | ORAL_TABLET | Freq: Every day | ORAL | Status: DC
  Administered 2018-03-20 – 2018-03-21 (×2): 20 mg via ORAL
  Filled 2018-03-20 (×2): qty 1

## 2018-03-20 MED ORDER — DOCUSATE SODIUM 100 MG PO CAPS
100.0000 mg | ORAL_CAPSULE | Freq: Two times a day (BID) | ORAL | Status: DC
  Administered 2018-03-20 – 2018-03-27 (×6): 100 mg via ORAL
  Filled 2018-03-20 (×10): qty 1

## 2018-03-20 MED ORDER — ADULT MULTIVITAMIN W/MINERALS CH
1.0000 | ORAL_TABLET | Freq: Every day | ORAL | Status: DC
  Administered 2018-03-20 – 2018-03-29 (×9): 1 via ORAL
  Filled 2018-03-20 (×9): qty 1

## 2018-03-20 MED ORDER — PHENYLEPHRINE HCL-NACL 10-0.9 MG/250ML-% IV SOLN
0.0000 ug/min | INTRAVENOUS | Status: DC
  Administered 2018-03-20: 20 ug/min via INTRAVENOUS
  Administered 2018-03-20: 60 ug/min via INTRAVENOUS
  Administered 2018-03-21: 80 ug/min via INTRAVENOUS
  Administered 2018-03-21: 30 ug/min via INTRAVENOUS
  Administered 2018-03-21: 75 ug/min via INTRAVENOUS
  Administered 2018-03-21: 70 ug/min via INTRAVENOUS
  Filled 2018-03-20 (×8): qty 250

## 2018-03-20 MED ORDER — IPRATROPIUM-ALBUTEROL 0.5-2.5 (3) MG/3ML IN SOLN
3.0000 mL | Freq: Once | RESPIRATORY_TRACT | Status: AC
  Administered 2018-03-20: 3 mL via RESPIRATORY_TRACT
  Filled 2018-03-20: qty 3

## 2018-03-20 MED ORDER — PIPERACILLIN-TAZOBACTAM 3.375 G IVPB
3.3750 g | Freq: Three times a day (TID) | INTRAVENOUS | Status: AC
  Administered 2018-03-21 – 2018-03-26 (×19): 3.375 g via INTRAVENOUS
  Filled 2018-03-20 (×22): qty 50

## 2018-03-20 MED ORDER — NOREPINEPHRINE 4 MG/250ML-% IV SOLN
0.0000 ug/min | INTRAVENOUS | Status: DC
  Administered 2018-03-20: 2 ug/min via INTRAVENOUS
  Filled 2018-03-20: qty 250

## 2018-03-20 MED ORDER — VANCOMYCIN HCL IN DEXTROSE 750-5 MG/150ML-% IV SOLN
750.0000 mg | Freq: Three times a day (TID) | INTRAVENOUS | Status: DC
  Administered 2018-03-21 – 2018-03-22 (×5): 750 mg via INTRAVENOUS
  Filled 2018-03-20 (×9): qty 150

## 2018-03-20 NOTE — ED Notes (Signed)
Notified doctor of patient's blood pressure. 

## 2018-03-20 NOTE — Progress Notes (Signed)
Sepsis - Repeat Assessment  Performed at:    1400  Vitals     Blood pressure (!) 82/65, pulse (!) 101, temperature 98.5 F (36.9 C), temperature source Oral, resp. rate (!) 4, height 6\' 1"  (1.854 m), weight 60.3 kg, SpO2 100 %.  Heart:     Regular rate and rhythm  Lungs:    Rhonchi  Capillary Refill:   <2 sec  Peripheral Pulse:   Radial pulse palpable  Skin:     Normal Color

## 2018-03-20 NOTE — ED Provider Notes (Signed)
Emergency Department Provider Note   I have reviewed the triage vital signs and the nursing notes.   HISTORY  Chief Complaint Respiratory Distress   HPI Kevin Whitney is a 57 y.o. male with PMH of CHF, chronic pain, PVD s/p LLE AKA, MRSA bacteremia, and known pressure ulcers as to the emergency department for evaluation of somnolence, hypotension, and poor respiratory drive.  EMS was initially called out by home health with concern for possible opiate withdrawal.  EMS found the patient to have a fentanyl patch on his leg which was removed.  They report hypoxemia to the 70s on room air and initial blood pressure in the 80s.  Family at home unable to provide significant history regarding the patient's baseline health or recent symptoms.  The patient was started on a nonrebreather and given IV fluids in route.   Level 5 caveat:   Past Medical History:  Diagnosis Date  . Acute blood loss anemia 12/2017  . Bacteremia   . Broken legs   . CHF (congestive heart failure) (HCC)   . Chronic pain   . Chronic pain syndrome   . Complication of anesthesia    " i hallucinate "  . DDD (degenerative disc disease), lumbar   . 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 MRSA (methicillin resistant staph aureus) culture positive    2003   . Pressure injury of skin of sacral region   . Protein calorie malnutrition (HCC)   . Staph infection   . UTI (urinary tract infection) 12/2017    Patient Active Problem List   Diagnosis Date Noted  . HCAP (healthcare-associated pneumonia) 03/20/2018  . Bacteremia   . Pressure injury of skin of sacral region   . DDD (degenerative disc disease), lumbosacral   . Chronic pain syndrome   . Leukocytosis   . Acute blood loss anemia   . Left buttock abscess 01/05/2018  . Stage III pressure ulcer of buttock (HCC) 01/05/2018  . MRSA bacteremia 01/05/2018  . Atherosclerosis of native artery of left lower extremity with  gangrene (HCC) 01/05/2018  . Antibiotic-associated diarrhea 01/05/2018  . Proteus infection 01/05/2018  . Acute lower UTI 01/05/2018  . Pressure ulcer of left upper back, stage 3 (HCC) 01/05/2018  . Hypokalemia 01/05/2018  . H/O degenerative disc disease 01/05/2018  . Chronic narcotic dependence (HCC) 01/05/2018  . Disuse atrophy of muscle 01/05/2018  . Tobacco abuse counseling 01/05/2018  . Right middle lobe pulmonary nodule 01/05/2018  . Chronic diastolic CHF (congestive heart failure) (HCC) 01/05/2018  . Non-healing ulcer of groin with fat layer exposed (HCC) 01/05/2018  . Gangrene of left foot (HCC) 01/05/2018  . Severe protein-calorie malnutrition (HCC)   . DDD (degenerative disc disease), lumbar 11/02/2015  . Facet syndrome, lumbar 11/02/2015  . Sacroiliac joint dysfunction 11/02/2015    Past Surgical History:  Procedure Laterality Date  . AMPUTATION Left 01/06/2018   Procedure: AMPUTATION ABOVE KNEE;  Surgeon: Nadara Mustard, MD;  Location: [facility] OR;  Service: Orthopedics;  Laterality: Left;  . BACK SURGERY    . DEBRIDMENT OF DECUBITUS ULCER N/A 01/07/2018   Procedure: DEBRIDMENT OF DECUBITUS ULCER;  Surgeon: Manus Rudd, MD;  Location: [facility] OR;  Service: General;  Laterality: N/A;  . FEMUR SURGERY     rt rod  . IR FLUORO GUIDE CV LINE RIGHT  01/07/2018  . IR US GUIDE VASC ACCESS RIGHT  01/07/2018  . surgery every 3 wks for mrsa    .  WRIST SURGERY     bilaterally    Allergies Cephalexin and Paroxetine hcl  Family History  Problem Relation Age of Onset  . Arthritis Father     Social History Social History   Tobacco Use  . Smoking status: Former Smoker    Packs/day: 0.75    Types: Cigarettes    Last attempt to quit: 01/06/2018    Years since quitting: 0.2  . Smokeless tobacco: Never Used  Substance Use Topics  . Alcohol use: No    Alcohol/week: 0.0 standard drinks  . Drug use: No    Review of Systems  Level 5 caveat:  Somnolent  ____________________________________________   PHYSICAL EXAM:  VITAL SIGNS: Vitals:   03/20/18 1530 03/20/18 1545  BP: (!) 89/65 (!) 82/65  Pulse: (!) 102 (!) 101  Resp: (!) 21 (!) 4  Temp:    SpO2: 100% 100%   Constitutional: Drowsy and cachectic. Patient is ill-appearing.  Eyes: Conjunctivae are normal. Head: Atraumatic. Nose: No congestion/rhinnorhea. Mouth/Throat: Mucous membranes are very dry.  Neck: No stridor.  Cardiovascular: Tachycardia. Good peripheral circulation. Grossly normal heart sounds.   Respiratory: Poor respiratory effort.  No retractions. Lungs with faint crackles at the bases.  Gastrointestinal: Soft and nontender. No distention.  Musculoskeletal: No lower extremity tenderness nor edema. Left AKA.  Neurologic:  Normal speech and language. Sacral decubitus ulcers noted.  Skin:  Skin is warm, dry and intact. No rash noted.  ____________________________________________   LABS (all labs ordered are listed, but only abnormal results are displayed)  Labs Reviewed  COMPREHENSIVE METABOLIC PANEL - Abnormal; Notable for the following components:      Result Value   Sodium 149 (*)    Potassium 2.5 (*)    CO2 33 (*)    Calcium 8.1 (*)    Total Protein 6.1 (*)    Albumin 1.5 (*)    All other components within normal limits  CBC WITH DIFFERENTIAL/PLATELET - Abnormal; Notable for the following components:   WBC 18.3 (*)    MCHC 29.4 (*)    RDW 15.6 (*)    Neutro Abs 16.3 (*)    All other components within normal limits  TROPONIN I - Abnormal; Notable for the following components:   Troponin I 0.03 (*)    All other components within normal limits  BRAIN NATRIURETIC PEPTIDE - Abnormal; Notable for the following components:   B Natriuretic Peptide 173.8 (*)    All other components within normal limits  PROTIME-INR - Abnormal; Notable for the following components:   Prothrombin Time 21.2 (*)    All other components within normal limits  I-STAT  CG4 LACTIC ACID, ED - Abnormal; Notable for the following components:   Lactic Acid, Venous 2.03 (*)    All other components within normal limits  CULTURE, BLOOD (ROUTINE X 2)  CULTURE, BLOOD (ROUTINE X 2)  URINALYSIS, ROUTINE W REFLEX MICROSCOPIC  I-STAT CG4 LACTIC ACID, ED   ____________________________________________  EKG   EKG Interpretation  Date/Time:  Wednesday March 20 2018 12:51:49 EST Ventricular Rate:  110 PR Interval:    QRS Duration: 93 QT Interval:  340 QTC Calculation: 460 R Axis:   77 Text Interpretation:  Sinus tachycardia Low voltage, extremity leads Repol abnrm suggests ischemia, diffuse leads No STEMI.  Confirmed by Alona Bene [phone]) on 03/20/2018 1:00:39 PM       ____________________________________________  RADIOLOGY  Dg Chest Portable 1 View  Result Date: 03/20/2018 CLINICAL DATA:  Shortness of breath today. EXAM: PORTABLE  CHEST 1 VIEW COMPARISON:  CT chest 03/11/2016. Single-view of the chest 01/05/2018 and 12/31/2017. FINDINGS: There is new airspace disease in the right lung base. Left lung is clear. Heart size is normal. No pneumothorax or pleural effusion. Aortic atherosclerosis is noted. Postoperative change of lower thoracic corpectomy and fusion noted. IMPRESSION: Right basilar airspace disease most consistent with pneumonia. Atherosclerosis. Electronically Signed   By: Drusilla Kanner M.D.   On: 03/20/2018 13:18    ____________________________________________   PROCEDURES  Procedure(s) performed:   Procedures  CRITICAL CARE Performed by: Maia Plan Total critical care time: 45 minutes Critical care time was exclusive of separately billable procedures and treating other patients. Critical care was necessary to treat or prevent imminent or life-threatening deterioration. Critical care was time spent personally by me on the following activities: development of treatment plan with patient and/or surrogate as well as nursing,  discussions with consultants, evaluation of patient's response to treatment, examination of patient, obtaining history from patient or surrogate, ordering and performing treatments and interventions, ordering and review of laboratory studies, ordering and review of radiographic studies, pulse oximetry and re-evaluation of patient's condition.  Alona Bene, MD Emergency Medicine  ____________________________________________   INITIAL IMPRESSION / ASSESSMENT AND PLAN / ED COURSE  Pertinent labs & imaging results that were available during my care of the patient were reviewed by me and considered in my medical decision making (see chart for details).  She presents to the emergency department for evaluation of somnolence and poor respiratory drive.  Fentanyl patch removed from the patient in route.  Mental status improving according to EMS.  Patient is very cachectic.  Blood pressure here is normal requiring high flow nasal cannula to maintain oxygen saturation.  Unclear etiology of respiratory symptoms/hypoxemia.  Plan for sepsis work-up.  Patient is making eye contact and attempting to answer questions.  It is difficult to understand him at times.  During last admission was found to have low cortisol.  He was supposed to have outpatient testing.  We will give hydrocortisone here.   Labs and CXR reviewed. Patient with PNA. Tolerating HFNC well at 10L. Somewhat fluid responsive with mental status unchanged. Lactate elevated. Pneumonia on CXR.   Discussed patient's case with ICU to request admission. Patient and family (if present) updated with plan. Care transferred to ICU service.  I reviewed all nursing notes, vitals, pertinent old records, EKGs, labs, imaging (as available).  ____________________________________________  FINAL CLINICAL IMPRESSION(S) / ED DIAGNOSES  Final diagnoses:  Septic shock (HCC)  Community acquired pneumonia, unspecified laterality     MEDICATIONS GIVEN DURING  THIS VISIT:  Medications  cefTRIAXone (ROCEPHIN) 2 g in sodium chloride 0.9 % 100 mL IVPB (0 g Intravenous Stopped 03/20/18 1430)  azithromycin (ZITHROMAX) 500 mg in sodium chloride 0.9 % 250 mL IVPB (0 mg Intravenous Stopped 03/20/18 1528)  vancomycin (VANCOCIN) 1,250 mg in sodium chloride 0.9 % 250 mL IVPB (1,250 mg Intravenous New Bag/Given 03/20/18 1451)  sodium chloride 0.9 % bolus 500 mL (0 mLs Intravenous Stopped 03/20/18 1342)  ipratropium-albuterol (DUONEB) 0.5-2.5 (3) MG/3ML nebulizer solution 3 mL (3 mLs Nebulization Given 03/20/18 1458)  hydrocortisone sodium succinate (SOLU-CORTEF) 100 MG injection 100 mg (100 mg Intravenous Given 03/20/18 1321)  sodium chloride 0.9 % bolus 1,000 mL (0 mLs Intravenous Stopped 03/20/18 1454)    And  sodium chloride 0.9 % bolus 1,000 mL (0 mLs Intravenous Stopped 03/20/18 1433)    Note:  This document was prepared using Dragon voice recognition software and  may include unintentional dictation errors.  Alona Bene, MD Emergency Medicine    Syriah Delisi, Arlyss Repress, MD 03/20/18 1556

## 2018-03-20 NOTE — Progress Notes (Signed)
eLink Physician-Brief Progress Note Patient Name: Kevin Whitney DOB: [birth year] MRN: [id number]   Date of Service  03/20/2018  HPI/Events of Note  K=2.3  eICU Interventions  Replace K, check MAG levels     Intervention Category Intermediate Interventions: Electrolyte abnormality - evaluation and management  Erin Fulling 03/20/2018, 7:36 PM

## 2018-03-20 NOTE — Progress Notes (Signed)
Called by RN for hypotension Plan to start neosynephrine for MAP > 65, order written  Heber [place], MD [place] [facility] Pager: [phone] Cell: [phone] After 3pm or if no response, call [phone]

## 2018-03-20 NOTE — ED Notes (Signed)
Nurse obtain one set of blood cultures upon arrival. Phlebtomy at bedside attempting 2nd set however unable to obtain blood. Will start antibiotics with one set.

## 2018-03-20 NOTE — ED Notes (Signed)
Kendrick Fries, MD notified re: pt BP order to be placed for Norepinephrine, MD aware med will be infused in #20 [facility]

## 2018-03-20 NOTE — ED Triage Notes (Signed)
Pt in from home via [facility], per report pts home health RN called d/t concern for pt being off of pain meds, per Home health RN pt just started back on Fentanyl patch and had it in place upon EMS arrival, EMS removed patch and was unable to state dose of patch, pt reported to have 73% O2 sats on RA, sys BP 82 and received 200 mL NS pta with last BP 120/70, pt has # 20 to L AC, pt unable to tolerate being in supine position, pt on NRB upon arrival to ED, pt reported to have decub ulcers and recent amputation to L AKA, pt A&O x4

## 2018-03-20 NOTE — Progress Notes (Signed)
Pharmacy Antibiotic Note  Kevin Whitney is a 57 y.o. male admitted on 03/20/2018 with sepsis.  Pharmacy has been consulted for vancomycin and zosyn dosing.  Received vancomycin 1250mg  x 1, azithromycin 500mg  IV x 1, ceftriaxone 2g IV x1 in ED.  Plan: Vancomycin 750mg  IV every 8 hours Zosyn 3.375g IV every 8 hours Monitor renal function, clinical progression, PCR/Cx ability to narrow Vancomycin level at steady state  Height: 6\' 1"  (185.4 cm) Weight: 133 lb (60.3 kg) IBW/kg (Calculated) : 79.9  Temp (24hrs), Avg:98.5 F (36.9 C), Min:98.5 F (36.9 C), Max:98.5 F (36.9 C)  Recent Labs  Lab 03/20/18 1307 03/20/18 1315 03/20/18 1657  WBC 18.3*  --   --   CREATININE 0.72  --   --   LATICACIDVEN  --  2.03* 1.28    Estimated Creatinine Clearance: 89 mL/min (by C-G formula based on SCr of 0.72 mg/dL).    Allergies  Allergen Reactions  . Cephalexin Other (See Comments)  . Paroxetine Hcl Other (See Comments)    SEDATION    Antimicrobials this admission: Vanc 11/6>> Zosyn 11/6>> azithro x1 Ceftriaxone x 1  Dose adjustments this admission: n/a  Microbiology results: BCx 11/6: sent  Daylene Posey, PharmD Clinical Pharmacist Please check AMION for all [facility] Pharmacy numbers 03/20/2018 5:09 PM

## 2018-03-20 NOTE — Progress Notes (Signed)
NAME:  Kevin Whitney, MRN:  [id number], DOB:  02/01/1961, LOS: 0 ADMISSION DATE:  03/20/2018, CONSULTATION DATE:  03/20/2018 REFERRING MD: Dr. 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, history of MRSA hardware infection spine, long history of skin and soft tissue wound left chest wall, opiate dependence Significant Hospital Events   March 20, 2018 admission  Consults: date of consult/date signed off & final recs:  March 20, 2018 wound ostomy consult  Procedures (surgical and bedside):    Significant Diagnostic Tests:    Micro Data:  March 20, 2018 blood culture   Antimicrobials:  March 20, 2018 vancomycin March 20, 2018 ceftriaxone x1 March 20, 2018 azithromycin x1 March 20, 2018 Zosyn  Subjective:  As above  Objective   Blood pressure (!) 86/75, pulse (!) 108, temperature 98.5 F (36.9 C), temperature source Oral, resp. rate (!) 23, height 6\' 1"  (1.854 m), weight 60.3 kg, SpO2 93 %.        Intake/Output Summary (Last 24 hours) at 03/20/2018 1528 Last data filed at 03/20/2018 1528 Gross per 24 hour  Intake 350 ml  Output -  Net 350 ml   Filed Weights   03/20/18 1256  Weight: 60.3 kg    Examination: General: Cachectic white male lying in bed, confused but no respiratory distress HENT: Mucous membranes profoundly dry, temporal wasting Lungs: Rhonchi right lower lobe, no accessory muscle use, speaking in full sentences Cardiovascular: Regular rate and rhythm no clear murmurs gallops or rubs, 2 plus pulses, cap refill  intact Abdomen: Scaphoid, soft nontender Extremities: Status post left AKA Dermatologic: 2 separate stage II ulcers noted on sacrum, largest approximately 0.5 cm x 2.5 cm, in addition there is a large penetrating wound on his back just medial to the mid axillary line and just under the scapula which is approximately 5 to 6 cm in length and 3 to 4 cm which is deep, muscle visible under the wound Neuro: Awake, speech is slurred but he is coherent and interacting GU: No abnormalities  Resolved Hospital Problem list     Assessment & Plan:  Severe sepsis due to healthcare associated pneumonia: Now status post 30 cc per cake of crystalloid resuscitation, blood pressure is low but at his baseline, he does not show signs of poor perfusion right now > Continue IV fluid with lactated Ringer's > Repeat lactic acid > Monitor blood pressure closely > Goal blood pressure is systolic greater than 80 > Monitor urine output closely > Change antibiotics to healthcare associated pneumonia coverage: Continue vancomycin > Start Zosyn > Stop ceftriaxone > Stop azithromycin  Recent MRSA infection > monitor blood culture  Multiple wounds present on admission: Wound care consult  History of hypotension baseline > Continue Midrin  Question of adrenal insufficiency last admission: > Start IV hydrocortisone  Acute respiratory failure with hypoxemia due to healthcare associated pneumonia > Admit to ICU > Close monitoring of O2 saturation > Administer O2 to maintain O2 sat greater than 90%  Hypokalemia, question spurious > Repeat stat  Narcotic dependence: > Decrease dose of Duragesic patch  Chronic pain/spasticity: > continue home medications  Peripheral vascular  disease > continue statin  GERD > famotidine  Protein calorie malnutrition, severe > continue reg diet and dietary supplementation  Disposition / Summary of Today's Plan 03/20/18   Admit to ICU due to severe HCAP  Call [facility]  for pick up in AM as currently hemodynamically stable and hypoxemia is improving     Diet: regular Pain/Anxiety/Delirium protocol (if indicated): APAP prn, fentanyl patch VAP protocol (if indicated): n/a DVT prophylaxis: sub q hep GI prophylaxis: famotidine Hyperglycemia protocol: n/a Mobility: PT consult  Code Status: Full Family Communication: updated mother, father bedside  Labs   CBC: Recent Labs  Lab 03/20/18 1307  WBC 18.3*  NEUTROABS 16.3*  HGB 13.3  HCT 45.3  MCV 92.6  PLT 177    Basic Metabolic Panel: Recent Labs  Lab 03/20/18 1307  NA 149*  K 2.5*  CL 110  CO2 33*  GLUCOSE 97  BUN 17  CREATININE 0.72  CALCIUM 8.1*   GFR: Estimated Creatinine Clearance: 89 mL/min (by C-G formula based on SCr of 0.72 mg/dL). Recent Labs  Lab 03/20/18 1307 03/20/18 1315  WBC 18.3*  --   LATICACIDVEN  --  2.03*    Liver Function Tests: Recent Labs  Lab 03/20/18 1307  AST 21  ALT 16  ALKPHOS 88  BILITOT 1.1  PROT 6.1*  ALBUMIN 1.5*   No results for input(s): LIPASE, AMYLASE in the last 168 hours. No results for input(s): AMMONIA in the last 168 hours.  ABG No results found for: PHART, PCO2ART, PO2ART, HCO3, TCO2, ACIDBASEDEF, O2SAT   Coagulation Profile: Recent Labs  Lab 03/20/18 1402  INR 1.86    Cardiac Enzymes: Recent Labs  Lab 03/20/18 1307  TROPONINI 0.03*    HbA1C: No results found for: HGBA1C  CBG: No results for input(s): GLUCAP in the last 168 hours.  Admitting History of Present Illness.   This is a 57 year old male with a past medical history significant for chronic pain syndrome, narcotic dependence who presented to our facility with shortness of breath.  He was recently hospitalized here for gangrene of his left leg with an MRSA infection including MRSA bacteremia.  During that hospitalization and evaluation he had an echocardiogram performed at an outside hospital which did not show evidence of endocarditis.  He was treated  here in our facility and underwent an above-the-knee amputation on January 06, 2018.  He was treated with IV vancomycin, started on Midodrin for low blood pressure, and there was concern for hypercortisolemia.  His family says that he was in his usual state of health which is mostly bedbound.  He has had chronic wounds ever since a back surgery multiple years ago and he receives nursing support from [facility] for this with wound care changes.  However, yesterday he started to have worsening shortness of breath so his family had him brought here today.  He is found to have right lower lobe pneumonia, some hypotension, and sepsis.  In the emergency room he is received IV fluids and IV saline.  His family says that EMS took off his fentanyl patch on the way.  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   Surgical History    Past Surgical History:  Procedure Laterality Date  . AMPUTATION Left 01/06/2018   Procedure: AMPUTATION ABOVE KNEE;  Surgeon: Nadara Mustard, MD;  Location[facility] OR;  Service: Orthopedics;  Laterality: Left;  . BACK SURGERY    . DEBRIDMENT OF DECUBITUS ULCER N/A 01/07/2018   Procedure: DEBRIDMENT OF DECUBITUS ULCER;  Surgeon: Manus Rudd, MD;  Location: [facility] OR;  Service: General;  Laterality: N/A;  . FEMUR SURGERY     rt rod  . IR FLUORO GUIDE CV LINE RIGHT  01/07/2018  . IR US GUIDE VASC ACCESS RIGHT  01/07/2018  . surgery every 3 wks for  mrsa    . WRIST SURGERY     bilaterally     Social History   Social History   Socioeconomic History  . Marital status: Married    Spouse name: Not on file  . Number of children: Not on file  . Years of education: Not on file  . Highest education level: Not on file  Occupational History  . Not on file  Social Needs  . Financial resource strain: Not on file  . Food insecurity:    Worry: Not on file    Inability: Not on file  . Transportation needs:    Medical: Not on file    Non-medical: Not on file  Tobacco Use  . Smoking status: Former Smoker    Packs/day: 0.75    Types: Cigarettes    Last attempt to quit: 01/06/2018    Years since quitting: 0.2  . Smokeless tobacco: Never Used  Substance and Sexual Activity  . Alcohol use: No    Alcohol/week: 0.0 standard drinks  . Drug use: No  . Sexual activity: Not on file  Lifestyle  . Physical activity:    Days per week: Not on file    Minutes per session: Not on file  . Stress: Not on file  Relationships  . Social connections:    Talks on phone: Not on file    Gets together: Not on file    Attends religious service: Not on file    Active member of club or organization: Not on file    Attends meetings of clubs or organizations: Not on file    Relationship status: Not on file  . Intimate partner violence:    Fear of current or ex partner: Not on file    Emotionally abused: Not on file    Physically abused: Not on file    Forced sexual activity: Not on file  Other Topics Concern  . Not on file  Social History Narrative  . Not on file  ,  reports that he quit smoking about 2 months ago. His smoking use included cigarettes. He smoked 0.75 packs per day. He has never used smokeless tobacco. He reports that he does not drink alcohol or use drugs.   Family History   His family history includes Arthritis in his father.   Allergies No Known Allergies   Home Medications  Prior to Admission medications   Medication Sig  Start Date End Date Taking? Authorizing Provider  Amino Acids-Protein Hydrolys (FEEDING SUPPLEMENT, PRO-STAT SUGAR FREE 64,) LIQD Take 30 mLs by mouth 2 (two) times daily. 01/15/18   Regalado, Jon Billings A, MD  aspirin EC 81 MG EC tablet Take 1 tablet (81 mg total) by mouth daily. 01/16/18  Regalado, Belkys A, MD  atorvastatin (LIPITOR) 40 MG tablet Take 1 tablet (40 mg total) by mouth daily at 6 PM. 01/15/18   Regalado, Belkys A, MD  cyclobenzaprine (FLEXERIL) 10 MG tablet Take 1 tablet (10 mg total) by mouth 3 (three) times daily as needed for muscle spasms. 01/15/18   Regalado, Belkys A, MD  diazepam (VALIUM) 10 MG tablet Take 1 tablet (10 mg total) by mouth every 12 (twelve) hours as needed for anxiety. 01/16/18   Osvaldo Shipper, MD  docusate sodium (COLACE) 100 MG capsule Take 1 capsule (100 mg total) by mouth 2 (two) times daily. 01/15/18   Regalado, Belkys A, MD  famotidine (PEPCID) 20 MG tablet Take 1 tablet (20 mg total) by mouth daily. 01/15/18   Regalado, Belkys A, MD  feeding supplement, ENSURE ENLIVE, (ENSURE ENLIVE) LIQD Take 237 mLs by mouth 3 (three) times daily between meals. 01/15/18   Regalado, Belkys A, MD  fentaNYL (DURAGESIC - DOSED MCG/HR) 75 MCG/HR Place 1 patch (75 mcg total) onto the skin every 3 (three) days. 01/17/18   Osvaldo Shipper, MD  ferrous sulfate 325 (65 FE) MG tablet Take 1 tablet (325 mg total) by mouth 2 (two) times daily with a meal. 01/15/18   Regalado, Belkys A, MD  liver oil-zinc oxide (DESITIN) 40 % ointment Apply 1 application topically as needed for irritation.    [provider]  midodrine (PROAMATINE) 5 MG tablet Take 1 tablet (5 mg total) by mouth 3 (three) times daily with meals. 01/15/18   Regalado, Belkys A, MD  Multiple Vitamin (MULTIVITAMIN WITH MINERALS) TABS tablet Take 1 tablet by mouth daily.    [provider]  Oxycodone HCl 10 MG TABS Take 1 tablet (10 mg total) by mouth every 3 (three) hours as needed. 01/16/18   Osvaldo Shipper, MD  polyethylene  glycol [facility]) packet Take 17 g by mouth 2 (two) times daily. 01/15/18   Regalado, Belkys A, MD  vancomycin (VANCOCIN) 1-5 GM/200ML-% SOLN Inject 200 mLs (1,000 mg total) into the vein every 12 (twelve) hours. 01/15/18   Alba Cory, MD     Critical care time: 45 minutes    Heber [place], MD [place] Pager: [phone] Cell: [phone] After 3pm or if no response, call [phone]

## 2018-03-21 LAB — BASIC METABOLIC PANEL
Anion gap: 4 — ABNORMAL LOW (ref 5–15)
BUN: 18 mg/dL (ref 6–20)
CALCIUM: 7.6 mg/dL — AB (ref 8.9–10.3)
CHLORIDE: 118 mmol/L — AB (ref 98–111)
CO2: 26 mmol/L (ref 22–32)
CREATININE: 0.56 mg/dL — AB (ref 0.61–1.24)
GFR calc Af Amer: >60 mL/min (ref >60)
GFR calc non Af Amer: >60 mL/min (ref >60)
Glucose, Bld: 126 mg/dL — ABNORMAL HIGH (ref 70–99)
Potassium: 3.1 mmol/L — ABNORMAL LOW (ref 3.5–5.1)
SODIUM: 148 mmol/L — AB (ref 135–145)

## 2018-03-21 LAB — CBC
HCT: 40.1 % (ref 39.0–52.0)
Hemoglobin: 12.1 g/dL — ABNORMAL LOW (ref 13.0–17.0)
MCH: 27.6 pg (ref 26.0–34.0)
MCHC: 30.2 g/dL (ref 30.0–36.0)
MCV: 91.3 fL (ref 80.0–100.0)
Platelets: 193 10*3/uL (ref 150–400)
RBC: 4.39 MIL/uL (ref 4.22–5.81)
RDW: 15.4 % (ref 11.5–15.5)
WBC: 25.2 10*3/uL — ABNORMAL HIGH (ref 4.0–10.5)
nRBC: 0 % (ref 0.0–0.2)

## 2018-03-21 MED ORDER — MUPIROCIN 2 % EX OINT
TOPICAL_OINTMENT | Freq: Two times a day (BID) | CUTANEOUS | Status: DC
  Administered 2018-03-21 – 2018-03-22 (×2): via NASAL
  Administered 2018-03-22 – 2018-03-23 (×3): 1 via NASAL
  Administered 2018-03-23 – 2018-03-24 (×3): via NASAL
  Administered 2018-03-25: 1 via NASAL
  Administered 2018-03-26 – 2018-03-29 (×8): via NASAL
  Administered 2018-03-29: 1 via NASAL
  Administered 2018-03-30 (×2): via NASAL
  Administered 2018-03-31: 1 via NASAL
  Administered 2018-03-31 – 2018-04-02 (×4): via NASAL
  Filled 2018-03-21 (×5): qty 22

## 2018-03-21 MED ORDER — IPRATROPIUM-ALBUTEROL 0.5-2.5 (3) MG/3ML IN SOLN: 3.0000 mL | RESPIRATORY_TRACT | Status: DC | PRN

## 2018-03-21 MED ORDER — POTASSIUM CHLORIDE 20 MEQ PO PACK
40.0000 meq | PACK | Freq: Two times a day (BID) | ORAL | Status: DC
  Administered 2018-03-21: 40 meq via ORAL
  Filled 2018-03-21 (×2): qty 2

## 2018-03-21 NOTE — Progress Notes (Signed)
SLP Cancellation Note  Patient Details Name: Simeon Vera MRN: [id number] DOB: [birth year]   Cancelled treatment:       Reason Eval/Treat Not Completed: Patient declined, no reason specified; pt with pain of 10 via faces; refused PO's d/t pain level; nursing stated he is taking medications with puree without overt s/s of aspiration; ST will continue efforts as pt is able/willing to participate.   Tressie Stalker, M.S., CCC-SLP 03/21/2018, 1:21 PM

## 2018-03-21 NOTE — Progress Notes (Signed)
Elink notified of no urine output. Bladder scan showed . No orders at this time. Will continue to monitor.

## 2018-03-21 NOTE — Consult Note (Signed)
WOC Nurse wound consult note Reason for Consult: multiple pressure injuries on admission Wound type: 1. Stage 3 Pressure injury: left scapula 2-6 Deep Tissue Injuries (4) along the right lateral rib cage, 1 on the thoracic spine, 1 on the left buttock  7. Stage 2 Pressure injury: left upper buttock/sacral region 8.  Old surgical wound of some type: healed but scabbed right groin 9.. Healed right heel pressure injury Pressure Injury POA: Yes Measurement: 1. Left scapula: 1cm x 3cm x 0.5cm; pale non granular, chronic open wound, with surrounding evidence of continued pressure, severe epibole of the wound edges 2-6.Right lateral rib cage: 4 100% dark purple areas with one rib with superficial skin peeling each aprox. 0.5cm x 1.5-2.0cm, spinal area 100% dark purple non blanchable; 100% dark purple left buttock non blanchable  7. Left upper buttock; 1.5cm x 0.3cm x 0.1cm : 100% dark purple non blanchable with superficial skin loss 8-9 did not measure, healed areas   Wound bed: see above Drainage (amount, consistency, odor) no significant drainage from any of the patient's wounds, no odor. Wounds do not appear acutely infected  Periwound: frail, cachetic man  Pressure injuries are all clear evidence this patient is WC bound and leans from side to side in his WC to create pressure on the spine, ribs, scapula and buttocks. Ideally patient should be custom fit for a WC for pressure redistribution.   Dressing procedure/placement/frequency: Silver hydrofiber for recalcitrant wound left scapula, cover with foam Foam dressings to the other sites Prevalon boot to the right foot to offload vunerable heel. Maximize nutrition for wound healing Low air loss mattress if transferred from the ICU.  No topical care really needed for the right groin.  Discussed POC with patient and bedside nurse.  Re consult if needed, will not follow at this time. Thanks  Antwan Pandya [employer], RN,CWOCN, CNS, CWON-AP  [phone])

## 2018-03-21 NOTE — Progress Notes (Signed)
MEWS/VS Documentation      03/21/2018 1500 03/21/2018 1514 03/21/2018 1634 03/21/2018 1700   MEWS Score:  2  4  1  2    MEWS Score Color:  Yellow  Red  Green  Yellow   Resp:  (!) 22  -  18  -   Pulse:  93  -  78  -   BP:  (!) 88/73  -  100/73  -   Temp:  -  98.2 F (36.8 C)  98.2 F (36.8 C)  -   O2 Device:  -  -  Nasal Cannula  -   O2 Flow Rate (L/min):  -  -  4 L/min  -   Level of Consciousness:  -  -  -  Responds to Voice

## 2018-03-21 NOTE — Progress Notes (Signed)
Patient arrived to unit, patient on O2 and has IV fluids going. Patient states he feels fine. Patient transferred to bed via staff (total assist).

## 2018-03-21 NOTE — Plan of Care (Signed)
  Problem: Clinical Measurements: Goal: Respiratory complications will improve Outcome: Progressing   

## 2018-03-21 NOTE — Progress Notes (Addendum)
NAME:  Kevin Whitney, MRN:  [id number], DOB:  10-27-1960, LOS: 1 ADMISSION DATE:  03/20/2018, CONSULTATION DATE:  03/20/2018 REFERRING MD: Dr. 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  Weaning down today morning Continues on high flow nasal cannula.  No complaints today morning.  Objective   Blood pressure 109/73, pulse 90, temperature 98.5 F (36.9 C), temperature source Oral, resp. rate (!) 29, height 6\' 1"  (1.854 m), weight 60 kg, SpO2 100 %.        Intake/Output Summary (Last 24 hours) at 03/21/2018 1019 Last data filed at 03/21/2018 0900 Gross per 24 hour  Intake 4395.27 ml  Output 300 ml  Net 4095.27 ml   Filed Weights   03/20/18 1256 03/20/18 1816 03/21/18 0500  Weight: 60.3 kg 57.6 kg 60 kg   Examination: Gen:   Cachectic white male. HEENT:  EOMI, sclera anicteric Neck:     No masses; no thyromegaly Lungs:    Clear to auscultation bilaterally; normal respiratory  effort CV:         Regular rate and rhythm; no murmurs Abd:      + bowel sounds; soft, non-tender; no palpable masses, no distension Ext:   Lt aka Skin:      Warm and dry; no rash Neuro: Mild confusion, slurring speech.  Follows simple commands.  Resolved Hospital Problem list     Assessment & Plan:  Septic shock due to healthcare associated pneumonia Wean down pressors, high flow nasal cannula Continue Vanco, Zosyn. Continue Midodrin Stress dose steroids.  Recent MRSA infection, multiple wounds Wound care consult  AKI, hyperkalemia Replete potassium. Follow BUN/creatinine  Narcotic dependence Chronic pain/spasticity Continue to Duragesic pack Continue home medications  Peripheral vascular disease Continue statin  GERD Famotidine  Protein calorie malnutrition, severe Continue reg diet and dietary supplementation  Disposition / Summary of Today's Plan 03/21/18   Weaning off high flow nasal cannula and pressors Transfer out of ICU if he is stable later today.    Diet: regular Pain/Anxiety/Delirium protocol (if indicated): APAP prn, fentanyl patch VAP protocol (if indicated): n/a DVT prophylaxis: sub q hep GI prophylaxis: famotidine Hyperglycemia protocol: n/a Mobility: PT consult  Code Status: Full Family Communication: updated mother, father bedside 11/6.  No family at bedside 11/7.  The patient is critically ill with multiple organ system failure and requires high complexity decision making for assessment and support, frequent evaluation and titration of therapies, advanced monitoring, review of radiographic studies and interpretation of complex data.   Critical Care Time devoted to patient care services, exclusive of  separately billable procedures, described in this note is 35 minutes.   Chilton Greathouse MD [facility] Pager [phone] If no answer or after 3pm call: [phone] 03/21/2018, 10:53 AM

## 2018-03-22 ENCOUNTER — Inpatient Hospital Stay (HOSPITAL_COMMUNITY): Payer: Medicare Other

## 2018-03-22 DIAGNOSIS — L8990 Pressure ulcer of unspecified site, unspecified stage: Secondary | ICD-10-CM

## 2018-03-22 DIAGNOSIS — T17908A Unspecified foreign body in respiratory tract, part unspecified causing other injury, initial encounter: Secondary | ICD-10-CM

## 2018-03-22 DIAGNOSIS — J9600 Acute respiratory failure, unspecified whether with hypoxia or hypercapnia: Secondary | ICD-10-CM

## 2018-03-22 DIAGNOSIS — R0602 Shortness of breath: Secondary | ICD-10-CM

## 2018-03-22 DIAGNOSIS — R6521 Severe sepsis with septic shock: Secondary | ICD-10-CM

## 2018-03-22 DIAGNOSIS — A419 Sepsis, unspecified organism: Secondary | ICD-10-CM

## 2018-03-22 LAB — BASIC METABOLIC PANEL
Anion gap: 4 — ABNORMAL LOW (ref 5–15)
BUN: 18 mg/dL (ref 6–20)
CHLORIDE: 118 mmol/L — AB (ref 98–111)
CO2: 28 mmol/L (ref 22–32)
CREATININE: 0.62 mg/dL (ref 0.61–1.24)
Calcium: 8.2 mg/dL — ABNORMAL LOW (ref 8.9–10.3)
GFR calc Af Amer: >60 mL/min (ref >60)
GFR calc non Af Amer: >60 mL/min (ref >60)
Glucose, Bld: 118 mg/dL — ABNORMAL HIGH (ref 70–99)
POTASSIUM: 3.4 mmol/L — AB (ref 3.5–5.1)
SODIUM: 150 mmol/L — AB (ref 135–145)

## 2018-03-22 LAB — POCT I-STAT 3, ART BLOOD GAS (G3+)
Acid-Base Excess: 3 mmol/L — ABNORMAL HIGH (ref 0.0–2.0)
Bicarbonate: 25.2 mmol/L (ref 20.0–28.0)
O2 Saturation: 97 %
Patient temperature: 98
TCO2: 26 mmol/L (ref 22–32)
pCO2 arterial: 27.7 mmHg — ABNORMAL LOW (ref 32.0–48.0)
pH, Arterial: 7.566 — ABNORMAL HIGH (ref 7.350–7.450)
pO2, Arterial: 78 mmHg — ABNORMAL LOW (ref 83.0–108.0)

## 2018-03-22 LAB — CBC
HEMATOCRIT: 40.3 % (ref 39.0–52.0)
HEMOGLOBIN: 12 g/dL — AB (ref 13.0–17.0)
MCH: 27.1 pg (ref 26.0–34.0)
MCHC: 29.8 g/dL — AB (ref 30.0–36.0)
MCV: 91 fL (ref 80.0–100.0)
Platelets: 174 10*3/uL (ref 150–400)
RBC: 4.43 MIL/uL (ref 4.22–5.81)
RDW: 15.1 % (ref 11.5–15.5)
WBC: 12.1 10*3/uL — ABNORMAL HIGH (ref 4.0–10.5)
nRBC: 0.2 % (ref 0.0–0.2)

## 2018-03-22 LAB — BLOOD GAS, ARTERIAL
Acid-Base Excess: 5.7 mmol/L — ABNORMAL HIGH (ref 0.0–2.0)
Bicarbonate: 29.7 mmol/L — ABNORMAL HIGH (ref 20.0–28.0)
DRAWN BY: 511911
O2 Content: 4 L/min
O2 Saturation: 95.7 %
PATIENT TEMPERATURE: 98.6
PH ART: 7.445 (ref 7.350–7.450)
pCO2 arterial: 44 mmHg (ref 32.0–48.0)
pO2, Arterial: 79.8 mmHg — ABNORMAL LOW (ref 83.0–108.0)

## 2018-03-22 LAB — GLUCOSE, CAPILLARY
GLUCOSE-CAPILLARY: 136 mg/dL — AB (ref 70–99)
Glucose-Capillary: 106 mg/dL — ABNORMAL HIGH (ref 70–99)
Glucose-Capillary: 125 mg/dL — ABNORMAL HIGH (ref 70–99)
Glucose-Capillary: 101 mg/dL — ABNORMAL HIGH (ref 70–99)
Glucose-Capillary: 116 mg/dL — ABNORMAL HIGH (ref 70–99)
Glucose-Capillary: 95 mg/dL (ref 70–99)

## 2018-03-22 LAB — MAGNESIUM
MAGNESIUM: 1.9 mg/dL (ref 1.7–2.4)
Magnesium: 1.8 mg/dL (ref 1.7–2.4)

## 2018-03-22 LAB — PHOSPHORUS
PHOSPHORUS: 1.6 mg/dL — AB (ref 2.5–4.6)
Phosphorus: 2.5 mg/dL (ref 2.5–4.6)

## 2018-03-22 LAB — VANCOMYCIN, TROUGH: Vancomycin Tr: 23 ug/mL (ref 15–20)

## 2018-03-22 MED ORDER — FENTANYL 2500MCG IN NS 250ML (10MCG/ML) PREMIX INFUSION
25.0000 ug/h | INTRAVENOUS | Status: DC
  Administered 2018-03-22: 50 ug/h via INTRAVENOUS
  Administered 2018-03-23: 100 ug/h via INTRAVENOUS
  Filled 2018-03-22 (×2): qty 250

## 2018-03-22 MED ORDER — POTASSIUM PHOSPHATES 15 MMOLE/5ML IV SOLN
20.0000 mmol | Freq: Once | INTRAVENOUS | Status: AC
  Administered 2018-03-22: 20 mmol via INTRAVENOUS
  Filled 2018-03-22: qty 6.67

## 2018-03-22 MED ORDER — SODIUM CHLORIDE 0.9 % IV SOLN
INTRAVENOUS | Status: DC | PRN
  Administered 2018-03-22 – 2018-03-27 (×2): 250 mL via INTRAVENOUS
  Administered 2018-03-31: 1000 mL via INTRAVENOUS

## 2018-03-22 MED ORDER — VITAL AF 1.2 CAL PO LIQD
1000.0000 mL | ORAL | Status: DC
  Administered 2018-03-22: 1000 mL

## 2018-03-22 MED ORDER — PRO-STAT SUGAR FREE PO LIQD
30.0000 mL | Freq: Two times a day (BID) | ORAL | Status: DC
  Administered 2018-03-22 – 2018-03-23 (×2): 30 mL
  Filled 2018-03-22 (×2): qty 30

## 2018-03-22 MED ORDER — FENTANYL CITRATE (PF) 100 MCG/2ML IJ SOLN
50.0000 ug | Freq: Once | INTRAMUSCULAR | Status: AC
  Administered 2018-03-22: 50 ug via INTRAVENOUS

## 2018-03-22 MED ORDER — HYDROCORTISONE NA SUCCINATE PF 100 MG IJ SOLR
50.0000 mg | Freq: Two times a day (BID) | INTRAMUSCULAR | Status: DC
  Administered 2018-03-22 – 2018-03-24 (×5): 50 mg via INTRAVENOUS
  Filled 2018-03-22 (×5): qty 2

## 2018-03-22 MED ORDER — VITAL HIGH PROTEIN PO LIQD: 1000.0000 mL | ORAL | Status: DC

## 2018-03-22 MED ORDER — NALOXONE HCL 0.4 MG/ML IJ SOLN: 0.4000 mg | INTRAMUSCULAR | Status: DC | PRN

## 2018-03-22 MED ORDER — NALOXONE HCL 0.4 MG/ML IJ SOLN
INTRAMUSCULAR | Status: AC
  Filled 2018-03-22: qty 1

## 2018-03-22 MED ORDER — MIDAZOLAM HCL 2 MG/2ML IJ SOLN
2.0000 mg | INTRAMUSCULAR | Status: DC | PRN
  Administered 2018-03-23: 2 mg via INTRAVENOUS
  Filled 2018-03-22: qty 2

## 2018-03-22 MED ORDER — MIDAZOLAM HCL 2 MG/2ML IJ SOLN: 2.0000 mg | INTRAMUSCULAR | Status: DC | PRN

## 2018-03-22 MED ORDER — ORAL CARE MOUTH RINSE
15.0000 mL | OROMUCOSAL | Status: DC
  Administered 2018-03-22 – 2018-03-23 (×12): 15 mL via OROMUCOSAL

## 2018-03-22 MED ORDER — VITAMIN C 250 MG PO TABS
250.0000 mg | ORAL_TABLET | Freq: Two times a day (BID) | ORAL | Status: DC
  Administered 2018-03-22 – 2018-03-29 (×12): 250 mg
  Filled 2018-03-22 (×12): qty 1

## 2018-03-22 MED ORDER — ETOMIDATE 2 MG/ML IV SOLN
20.0000 mg | Freq: Once | INTRAVENOUS | Status: AC
  Administered 2018-03-22: 20 mg via INTRAVENOUS

## 2018-03-22 MED ORDER — CHLORHEXIDINE GLUCONATE 0.12% ORAL RINSE (MEDLINE KIT)
15.0000 mL | Freq: Two times a day (BID) | OROMUCOSAL | Status: DC
  Administered 2018-03-22 – 2018-03-23 (×3): 15 mL via OROMUCOSAL

## 2018-03-22 MED ORDER — FENTANYL BOLUS VIA INFUSION
25.0000 ug | INTRAVENOUS | Status: DC | PRN
  Administered 2018-03-22 – 2018-03-23 (×2): 25 ug via INTRAVENOUS
  Filled 2018-03-22: qty 25

## 2018-03-22 MED ORDER — VANCOMYCIN HCL IN DEXTROSE 1-5 GM/200ML-% IV SOLN
1000.0000 mg | Freq: Two times a day (BID) | INTRAVENOUS | Status: DC
  Administered 2018-03-22 – 2018-03-25 (×6): 1000 mg via INTRAVENOUS
  Filled 2018-03-22 (×6): qty 200

## 2018-03-22 MED ORDER — MIDAZOLAM HCL 2 MG/2ML IJ SOLN
2.0000 mg | Freq: Once | INTRAMUSCULAR | Status: AC
  Administered 2018-03-22: 2 mg via INTRAVENOUS

## 2018-03-22 MED ORDER — FAMOTIDINE 20 MG PO TABS
20.0000 mg | ORAL_TABLET | Freq: Two times a day (BID) | ORAL | Status: DC
  Administered 2018-03-22 – 2018-03-23 (×3): 20 mg via ORAL
  Filled 2018-03-22 (×3): qty 1

## 2018-03-22 NOTE — Significant Event (Addendum)
Rapid Response Event Note  Overview: Respiratory/Neurologic  Initial Focused Assessment: RN called concerned about patient's respiratory status and overall neurologic status. Per RN, patient sounds "wet" and there is concern for aspiration. Upon arrival, patient was very lethargic, awakens to physical stimuli, would mouth a word or two but did not phonate any speech. intermittently follow commands, very weak cough, SLP session was cancelled yesterday but I am concerned that patient is aspirating, he does not seem like he can clear his secretions. Lung sounds + rhonchi throughout, very diminished, minimal air movement, shallow/fast respirations, RR in the upper 30s - low 40s. 99% on 5L [place]. Skin very pale and cool to touch. SBP 80-90s, MAP > 60, HR in the 70-80s.   I called [facility], spoke with the [facility] RN, RN updated on patient's condition. [facility] MD to follow up with CXR/ABG and will send PCCM rounding team. Currently patient is not protecting his airway.   Interventions: -- STAT CXR - mild progression of bilateral basilar infiltration, atelectasis, and effusions. -- STAT ABG - normal -- BS - 136   Plan of Care: -- PCCM NP came to the bedside, plan to transfer to ICU, and patient will need to be intubated.   Event Summary:    at    Call Time 0057 Arrival Time 0058 End Time 0245  Cheikh Bramble R

## 2018-03-22 NOTE — Progress Notes (Signed)
Initial Nutrition Assessment  DOCUMENTATION CODES:   Severe malnutrition in context of chronic illness  INTERVENTION:   Tube Feeding:  Vital AF 1.2 @ 55 ml/hr Pro-Stat 30 mL BID Provided 129 g of protein, 1784 kcals, 1069 mL of free water Meets 100% protein needs, calorie needs Given refeeding risk, being at 20 ml/hr, titrate by 10 mL q 8 hours until goal rate  Monitor magnesium, potassium, and phosphorus q 12 hours for at least 5 occurences, MD to replete as needed, as pt is at risk for refeeding syndrome given .  Given poor wound healing and poor nutritional status, recommend addition of Vitamin C 250 mg BID given possible deficiency  NUTRITION DIAGNOSIS:   Severe Malnutrition related to chronic illness as evidenced by energy intake < 75% for > or equal to 1 month, severe fat depletion, severe muscle depletion.  GOAL:   Patient will meet greater than or equal to 90% of their needs  MONITOR:   Vent status, Labs, Weight trends, Skin  REASON FOR ASSESSMENT:   Consult, Ventilator Enteral/tube feeding initiation and management  ASSESSMENT:   57 yo male admitted with acute respiratory failure and severe sepsis related to HCAP, AKI. Pt initially on HFNC but decompensated on 11/8 requiring intubation. Pt with recent admission for MRSA bacteremia related to wounds. PMH includes spinal stenosis, L AKA, multiple MRSA infections, wheelchair bound, PVD, narcotic dependence   Patient is currently intubated on ventilator support, fentanyl and versed for sedation MV: 13 L/min Temp (24hrs), Avg:98.1 F (36.7 C), Min:96.9 F (36.1 C), Max:98.7 F (37.1 C)   Family at bedside. Family reports pt has been "eating like a horse." However, they indicate that he eats 3 meals per day but these are small meals.  A meal might be 1 hotdog or pot roast with potatoes. Pt drinks lots of 7up. Pt drinks Ensure but not very often. He eats whatever he wants and likes a variety of foods including fruits  and vegetables. indicates pt has been having diarrhea most days and multiple times per day post hospoitalization  Muscle wasting expected in LE given pt medical hx and wheel chair bound status. However, subcutaneous fat wasting is not expected but is present, likely related to inadequate nutrition. Pt also with severe wasting in temporal region and upper body. Again likely related to inadequate nutrition. Family reports pt utilizes both arms and feeds self etc at baseline  Family indicates that pt is scheduled to have prostheses made for leg  Weight 57.6 kg on admission; current wt documented as 64 kg. Plan to utilize admission wt as EDW. Pt net + 6L since admission.   Pt with multiple wounds, WOC RN following  Pt is edentulous, no dentures  Labs: sodium 150 (H), potassium 3.4, phosphorus 1.6 (L) Meds: MVI, potassium phosphate  NUTRITION - FOCUSED PHYSICAL EXAM:    Most Recent Value  Orbital Region  Severe depletion  Upper Arm Region  Moderate depletion  Thoracic and Lumbar Region  Severe depletion  Buccal Region  Severe depletion  Temple Region  Severe depletion  Clavicle Bone Region  Severe depletion  Clavicle and Acromion Bone Region  Severe depletion  Scapular Bone Region  Severe depletion  Dorsal Hand  Moderate depletion  Patellar Region  Severe depletion  Anterior Thigh Region  Severe depletion  Posterior Calf Region  Severe depletion  Edema (RD Assessment)  None  Hair  -- [brittle, thin]  Eyes  Unable to assess  Mouth  -- [teeth "rotted out", edentulous]  Skin  Reviewed  Nails  Reviewed       Diet Order:   Diet Order            Diet NPO time specified  Diet effective now              EDUCATION NEEDS:   Not appropriate for education at this time  Skin:  Skin Assessment: Skin Integrity Issues: Skin Integrity Issues:: Stage II, Stage III, DTI DTI: thoracic spine, buttock, right lateral rib cage x 4 Stage II: left upper buttock/sacrum Stage III: left  scapula  Last BM:  no documented BM, diarrhea reported PTA  Height:   Ht Readings from Last 1 Encounters:  03/20/18 6\' 1"  (1.854 m)    Weight:   Wt Readings from Last 1 Encounters:  03/21/18 64 kg    Ideal Body Weight:     BMI:  Body mass index is 18.6 kg/m.  Estimated Nutritional Needs:   Kcal:  1797 kcals   Protein:  115-135 g  Fluid:  >/= 1.8 L    Romelle Starcher MS, RD, LDN, CNSC [phone] Pager  [phone] Weekend/On-Call Pager

## 2018-03-22 NOTE — Plan of Care (Signed)
  Problem: Elimination: Goal: Will not experience complications related to bowel motility 03/22/2018 0442 by Barton Dubois, RN Outcome: Not Progressing 03/22/2018 0438 by Barton Dubois, RN Outcome: Not Met (add Reason)

## 2018-03-22 NOTE — Progress Notes (Signed)
Pt. Noted with labored respiration, RR of 40, wet lung sounds, rhonchi, upon assessment. Pt. arousable to voice and touch. RR RN paged to assess pt. Upon assessment, pt. transferred to higher level of care. Pts. Family updated on pts. condiiton by critical care MD.

## 2018-03-22 NOTE — Progress Notes (Signed)
NAME:  Kevin Whitney, MRN:  [id number], DOB:  1960-09-27, LOS: 2 ADMISSION DATE:  03/20/2018, CONSULTATION DATE:  03/20/2018 REFERRING MD: Dr. 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, history of MRSA hardware infection spine, long history of skin and soft tissue wound left chest wall, opiate dependence Significant Hospital Events   March 20, 2018 admission 11/7 transfer out of icu after tx with HFNC, pressors, ABX for HCAP 11/8 MS worse, not protecting airway, transferred back to ICU   Consults: date of consult/date signed off & final recs:  March 20, 2018 wound ostomy consult  Procedures (surgical and bedside):  ETT 11/8 >  Significant Diagnostic Tests:    Micro Data:  March 20, 2018 blood culture  Antimicrobials:  March 20, 2018 vancomycin > March 20, 2018 Zosyn > March 20, 2018 ceftriaxone x1 March 20, 2018 azithromycin x1  INTERVAL HISTORY:  Off pressors 11/7 and moved out of ICU, as the evening progressed his mental status progressively worsened and his respiratory status declined. Pola Corn was called with these concerns around 0200 11/8. Upon my arrival the patient is lethargic and difficult to arouse. He has pooled upper airway secretions he is not lucid enough to manage. Coarse bilateral breath sounds. Have called family who are in favor of [place] moving forward with intubation if necessary.   Objective   Blood pressure 102/82, pulse 74, temperature 98.7 F (37.1 C), temperature source Oral, resp. rate 18, height 6\' 1"  (1.854 m), weight 64  kg, SpO2 93 %.        Intake/Output Summary (Last 24 hours) at 03/22/2018 0218 Last data filed at 03/21/2018 1835 Gross per 24 hour  Intake 2341.69 ml  Output 300 ml  Net 2041.69 ml   Filed Weights   03/20/18 1816 03/21/18 0500 03/21/18 1634  Weight: 57.6 kg 60 kg 64 kg   Examination: Gen:   Cachectic male appears older than stated age HEENT:  [place]/AT, PERRL, no appreciable JVD Lungs:    Coarse rhonchi throughout, pooling upper airway secretions.   CV:         Tachy, regular, no MRG Abd:     Soft, non-tender, abdominal aortic pulse bounding.  Ext:   Lt aka Skin:      Warm and dry; no rash Neuro: Lethargic. Arouses to verbal only momentarily, them goes back to sleep. Unable to have meaningful interaction.  Resolved Hospital Problem list     Assessment & Plan:   Inability to protect airway - transfer to ICU for intubation - Full vent support - Follow CXR, ABG - VAP bundle  Septic shock due to healthcare associated pneumonia, suspect now complicated by aspiration.  - Continue Vanco, Zosyn - Continue Midodrine, will restart pressors if needed - Stress dose steroids continue  Recent MRSA infection, multiple wounds - Wound care nurse following - ABX as above  AKI, hyperkalemia - Repeat BMP now - Follow BUN/creatinine  Narcotic dependence Chronic pain/spasticity - DC duragesic - Holding home medications  Peripheral vascular disease - Continue statin  GERD - Famotidine  Protein calorie malnutrition, severe - TF per protocol  Disposition / Summary of Today's  Plan 03/22/18   Transfer back to ICU, intubated. Continue to treat HCAP. Suspect he has been aspiration.     Diet: regular Pain/Anxiety/Delirium protocol (if indicated): Fenatnyl infusion. PRN versed VAP protocol (if indicated): yes DVT prophylaxis: sub q hep GI prophylaxis: famotidine Hyperglycemia protocol: n/a Mobility: BR Code Status: Full Family Communication: Called mother prior to intubation  and relayed plan to her.   My critical care time excluding procedures: 45 minutes   Joneen Roach, AGACNP-BC [facility] Pulmonary/Critical Care Pager [phone] or [phone]  03/22/2018 3:18 AM

## 2018-03-22 NOTE — Progress Notes (Signed)
CRITICAL VALUE ALERT  Critical Value:  Vanc Trough >23  Date & Time Notied:  03/22/2018 1600  Provider Notified: Dr. Isaiah Serge  Orders Received/Actions taken: Hold 1500 Vanc dose and inform Pharmacy

## 2018-03-22 NOTE — Progress Notes (Signed)
SLP Cancellation Note  Patient Details Name: Kevin Whitney MRN: [id number] DOB: [birth year]   Cancelled treatment:       Reason Eval/Treat Not Completed: Medical issues which prohibited therapy. Chart reviewed - pt now intubated. Will f/u for readiness to try swallow evaluation.   Maxcine Ham 03/22/2018, 8:27 AM  Maxcine Ham, M.A. CCC-SLP Acute Herbalist [phone] Office [phone]

## 2018-03-22 NOTE — Progress Notes (Signed)
PCCM interval note  Patient intubated overnight Stable on the vent, awake, follows commands Hemodynamically stable  Blood pressure 96/72, pulse 71, temperature (!) 96.9 F (36.1 C), temperature source Axillary, resp. rate 20, height 6\' 1"  (1.854 m), weight 64 kg, SpO2 97 %. Gen:      Frail, cachectic HEENT:  EOMI, sclera anicteric Neck:     No masses; no thyromegaly Lungs:    Clear to auscultation bilaterally; normal respiratory effort CV:         Regular rate and rhythm; no murmurs Abd:      + bowel sounds; soft, non-tender; no palpable masses, no distension Ext:    Lt AKA Skin:      Warm and dry; no rash Neuro: Awake, responsive.  Chest x-ray 03/22/2018- ETT tube in good position, bilateral basal opacities, small bilateral effusions.  I have reviewed the images personally.  Assessment/plan: 57 year old with multiple chronic medical issues Pneumonia, aspiration  Respiratory failure Continue vent support Start weaning trials Spectrum antibiotics Follow cultures  Hypophosphatemia Replete lytes  Septic shock Resolved.  Off pressors Wean down stress dose steroids.  Goals of care Spoke with mother and father at bedside.  He apparently has good quality of life at home and is able to make his own decisions They prefer that we discuss with him when he is more awake. He remains full code for now.  The patient is critically ill with multiple organ system failure and requires high complexity decision making for assessment and support, frequent evaluation and titration of therapies, advanced monitoring, review of radiographic studies and interpretation of complex data.   Critical Care Time devoted to patient care services, exclusive of separately billable procedures, described in this note is 35 minutes.   Chilton Greathouse MD [place] [facility] and Critical [facility] Pager [phone] If no answer or after 3pm call: [phone] 03/22/2018, 11:25 AM

## 2018-03-22 NOTE — Plan of Care (Signed)
  Problem: Elimination: Goal: Will not experience complications related to bowel motility Outcome: Not Met (add Reason)

## 2018-03-22 NOTE — Progress Notes (Signed)
Rt advanced Endotracheal tube 2 CM per MD request.

## 2018-03-22 NOTE — Procedures (Signed)
Intubation Procedure Note Kevin Whitney [id number] 1960/08/28  Procedure: Intubation Indications: Airway protection and maintenance  Procedure Details Consent: Risks of procedure as well as the alternatives and risks of each were explained to the (patient/caregiver).  Consent for procedure obtained. Time Out: Verified patient identification, verified procedure, site/side was marked, verified correct patient position, special equipment/implants available, medications/allergies/relevent history reviewed, required imaging and test results available.  Performed  Maximum clean technique was used including  MAC and 4  Medications: - 67mg fentanyl  - 226mversed - Etomidate 2064m Evaluation Hemodynamic Status: BP stable throughout; O2 sats: stable throughout, bradycardia during procedure. Self limiting.  Patient's Current Condition: stable Complications: No apparent complications Patient did tolerate procedure well. Chest X-ray ordered to verify placement.  CXR: tube position high-repostitioned.   PauGeorgann HousekeeperGACNP-BC [facility] [facility] [phone]/12/2017 3:26 AM

## 2018-03-22 NOTE — Progress Notes (Signed)
Pharmacy Antibiotic Note  Kerem Gilmer is a 57 y.o. male admitted on 03/20/2018 with sepsis/PNA.  Pharmacy has been consulted for vancomycin and Zosyn dosing.    Renal function is stable and vancomycin trough is slightly supra-therapeutic.  Afebrile and WBC is improving.   Plan: Change vanc to 1gm IV Q12H Continue Zosyn EID 3.375gm IV Q8H Monitor renal fxn, clinical progress, repeat vanc trough as indicated   Height: 6\' 1"  (185.4 cm) Weight: 141 lb (64 kg) IBW/kg (Calculated) : 79.9  Temp (24hrs), Avg:98.1 F (36.7 C), Min:96.9 F (36.1 C), Max:98.7 F (37.1 C)  Recent Labs  Lab 03/20/18 1307 03/20/18 1315 03/20/18 1648 03/20/18 1657 03/20/18 2019 03/21/18 0245 03/22/18 0609 03/22/18 1454  WBC 18.3*  --   --   --  19.5* 25.2* 12.1*  --   CREATININE 0.72  --  0.55*  --  0.62 0.56* 0.62  --   LATICACIDVEN  --  2.03* 1.4 1.28  --   --   --   --   VANCOTROUGH  --   --   --   --   --   --   --  23*    Estimated Creatinine Clearance: 94.4 mL/min (by C-G formula based on SCr of 0.62 mg/dL).    Allergies  Allergen Reactions  . Cephalexin Other (See Comments)  . Paroxetine Hcl Other (See Comments)    SEDATION    Vanc 11/6>> Zosyn 11/6>> azithro x1 Ceftriaxone x 1  11/8 VT = 23 mcg/mL on 750mg  q8 >> 1g q12  BCx 11/6: sent MRSA PCR +   Kadence Mikkelson D. Laney Potash, PharmD, BCPS, BCCCP 03/22/2018, 4:42 PM

## 2018-03-23 ENCOUNTER — Inpatient Hospital Stay (HOSPITAL_COMMUNITY): Payer: Medicare Other

## 2018-03-23 DIAGNOSIS — J9601 Acute respiratory failure with hypoxia: Secondary | ICD-10-CM

## 2018-03-23 LAB — GLUCOSE, CAPILLARY
GLUCOSE-CAPILLARY: 108 mg/dL — AB (ref 70–99)
GLUCOSE-CAPILLARY: 130 mg/dL — AB (ref 70–99)
GLUCOSE-CAPILLARY: 148 mg/dL — AB (ref 70–99)
Glucose-Capillary: 104 mg/dL — ABNORMAL HIGH (ref 70–99)
Glucose-Capillary: 125 mg/dL — ABNORMAL HIGH (ref 70–99)
Glucose-Capillary: 76 mg/dL (ref 70–99)
Glucose-Capillary: 89 mg/dL (ref 70–99)

## 2018-03-23 LAB — BASIC METABOLIC PANEL
Anion gap: 4 — ABNORMAL LOW (ref 5–15)
BUN: 20 mg/dL (ref 6–20)
CHLORIDE: 118 mmol/L — AB (ref 98–111)
CO2: 26 mmol/L (ref 22–32)
Calcium: 7.8 mg/dL — ABNORMAL LOW (ref 8.9–10.3)
Creatinine, Ser: 0.77 mg/dL (ref 0.61–1.24)
GFR calc non Af Amer: >60 mL/min (ref >60)
Glucose, Bld: 145 mg/dL — ABNORMAL HIGH (ref 70–99)
Potassium: 2.6 mmol/L — CL (ref 3.5–5.1)
Sodium: 148 mmol/L — ABNORMAL HIGH (ref 135–145)

## 2018-03-23 LAB — CBC
HCT: 36.4 % — ABNORMAL LOW (ref 39.0–52.0)
Hemoglobin: 11 g/dL — ABNORMAL LOW (ref 13.0–17.0)
MCH: 26.8 pg (ref 26.0–34.0)
MCHC: 30.2 g/dL (ref 30.0–36.0)
MCV: 88.8 fL (ref 80.0–100.0)
PLATELETS: 162 10*3/uL (ref 150–400)
RBC: 4.1 MIL/uL — AB (ref 4.22–5.81)
RDW: 15.4 % (ref 11.5–15.5)
WBC: 10.4 10*3/uL (ref 4.0–10.5)
nRBC: 0.2 % (ref 0.0–0.2)

## 2018-03-23 LAB — PHOSPHORUS
PHOSPHORUS: 3.7 mg/dL (ref 2.5–4.6)
Phosphorus: 2 mg/dL — ABNORMAL LOW (ref 2.5–4.6)

## 2018-03-23 LAB — MAGNESIUM
Magnesium: 1.8 mg/dL (ref 1.7–2.4)
Magnesium: 2 mg/dL (ref 1.7–2.4)

## 2018-03-23 MED ORDER — POTASSIUM PHOSPHATES 15 MMOLE/5ML IV SOLN
30.0000 mmol | Freq: Once | INTRAVENOUS | Status: AC
  Administered 2018-03-23: 30 mmol via INTRAVENOUS
  Filled 2018-03-23: qty 10

## 2018-03-23 MED ORDER — ORAL CARE MOUTH RINSE
15.0000 mL | Freq: Two times a day (BID) | OROMUCOSAL | Status: DC
  Administered 2018-03-23 – 2018-03-26 (×6): 15 mL via OROMUCOSAL

## 2018-03-23 MED ORDER — FAMOTIDINE IN NACL 20-0.9 MG/50ML-% IV SOLN
20.0000 mg | Freq: Two times a day (BID) | INTRAVENOUS | Status: DC
  Administered 2018-03-23 – 2018-03-28 (×10): 20 mg via INTRAVENOUS
  Filled 2018-03-23 (×11): qty 50

## 2018-03-23 MED ORDER — POTASSIUM CHLORIDE 20 MEQ/15ML (10%) PO SOLN
ORAL | Status: AC
  Filled 2018-03-23: qty 30

## 2018-03-23 MED ORDER — POTASSIUM CHLORIDE 20 MEQ/15ML (10%) PO SOLN
80.0000 meq | Freq: Once | ORAL | Status: AC
  Administered 2018-03-23: 80 meq
  Filled 2018-03-23: qty 60

## 2018-03-23 NOTE — Progress Notes (Signed)
eLink Physician-Brief Progress Note Patient Name: Kevin Whitney DOB: Jun 02, 1960 MRN: [id number]   Date of Service  03/23/2018  HPI/Events of Note  Patient NPO - Request to change Pepcid per tube to IV.  eICU Interventions  Will change Pepcid to IV.     Intervention Category Intermediate Interventions: Other:  Lenell Antu 03/23/2018, 9:26 PM

## 2018-03-23 NOTE — Procedures (Signed)
Extubation Procedure Note  Patient Details:   Name: Kevin Whitney DOB: March 09, 1961 MRN: [id number]   Airway Documentation:    Vent end date: 03/23/18 Vent end time: 1148   Evaluation  O2 sats: stable throughout Complications: No apparent complications Patient did tolerate procedure well. Bilateral Breath Sounds: Rhonchi   Yes  Peggye Form 03/23/2018, 11:49 AM

## 2018-03-23 NOTE — Progress Notes (Signed)
NAME:  Kevin Whitney, MRN:  [id number], DOB:  26-Aug-1960, LOS: 3 ADMISSION DATE:  03/20/2018, CONSULTATION DATE:  03/20/2018 REFERRING MD: Dr. 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, history of MRSA hardware infection spine, long history of skin and soft tissue wound left chest wall, opiate dependence   Significant Hospital Events   March 20, 2018 admission 11/7 transfer out of icu after tx with HFNC, pressors, ABX for HCAP 11/8 MS worse, not protecting airway, transferred back to ICU, intubated 11/9 Small right pneumothorax  Consults: date of consult/date signed off & final recs:  March 20, 2018 wound ostomy consult  Procedures (surgical and bedside):  ETT 11/8 >   Significant Diagnostic Tests:  11/6 blood cultures-negative  Micro Data:    Antimicrobials:  March 20, 2018 vancomycin > March 20, 2018 Zosyn > March 20, 2018 ceftriaxone x1 March 20, 2018 azithromycin x1  INTERVAL HISTORY:  Stable overnight, weaning on pressure support On the vent, follows commands.  Objective   Blood pressure (!) 89/75, pulse 80, temperature 99.8 F (37.7 C), temperature source Oral, resp. rate 12, height 6\' 1"  (1.854 m), weight 64 kg, SpO2 97 %.    Vent Mode: CPAP FiO2 (%):  [40 %-50 %] 40 % Set Rate:  [20 bmp] 20 bmp Vt Set:  [550 mL] 550 mL PEEP:  [5 cmH20] 5 cmH20 Pressure Support:  [5 cmH20] 5 cmH20 Plateau Pressure:  [15 cmH20] 15 cmH20   Intake/Output Summary (Last 24 hours) at 03/23/2018 1013 Last data filed at 03/23/2018 0800 Gross per 24  hour  Intake 1720.2 ml  Output 495 ml  Net 1225.2 ml   Filed Weights   03/20/18 1816 03/21/18 0500 03/21/18 1634  Weight: 57.6 kg 60 kg 64 kg   Examination: Gen:   Frail, cachectic HEENT:  EOMI, sclera anicteric, ET tube Neck:     No masses; no thyromegaly Lungs:    Clear to auscultation bilaterally; normal respiratory effort CV:         Regular rate and rhythm; no murmurs Abd:      + bowel sounds; soft, non-tender; no palpable masses, no distension Ext:    No edema; adequate peripheral perfusion Skin:      Warm and dry; no rash Neuro: Sedated, wakes up to stimuli.  No focal deficits.  Resolved Hospital Problem list     Assessment & Plan:  Respiratory failure Intubated for inability to protect airway Small right pneumothorax Doing well on weaning trial.  Will extubate Feel that the pneumothorax is too small right now for chest tube.  We will monitor this after removing positive pressure. Repeat chest x-ray later today.  Septic shock due to healthcare associated pneumonia, suspect now complicated by aspiration.  Vanco, Zosyn Continue monitoring Wean down stress dose steroids..  Recent MRSA infection, multiple wounds Wound care Antibiotics as above  AKI, hypokalemia Follow BUN/creatinine. Replete lites.  Narcotic dependence Chronic pain/spasticity Taken of the Duragesic Holding home medication  Peripheral vascular disease Continue statin  GERD Pepcid  Protein calorie malnutrition, severe Holding tube feeds for extubation.  Goals of care Spoke with mother and father  at bedside 11/8.  He apparently has good quality of life at home and is able to make his own decisions They prefer that we discuss with him when he is more awake. He remains full code for now.  Disposition / Summary of Today's Plan 03/23/18   Extubated, monitor pneumothorax Continue antibiotics.    Diet: regular Pain/Anxiety/Delirium protocol (if indicated): Holding sedation VAP protocol  (if indicated): yes DVT prophylaxis: sub q hep GI prophylaxis: famotidine Hyperglycemia protocol: n/a Mobility: BR Code Status: Full Family Communication: Family updated 11/8.  No family at bedside 9/9.   The patient is critically ill with multiple organ system failure and requires high complexity decision making for assessment and support, frequent evaluation and titration of therapies, advanced monitoring, review of radiographic studies and interpretation of complex data.   Critical Care Time devoted to patient care services, exclusive of separately billable procedures, described in this note is 35 minutes.   Chilton Greathouse MD [place] [facility] and Critical Care Pager [phone] If no answer or after 3pm call: [phone] 03/23/2018, 10:29 AM

## 2018-03-23 NOTE — Progress Notes (Signed)
CRITICAL VALUE ALERT  Critical Value:  Potassium 2.6  Date & Time Notied:  03/23/2018; 0505  Provider Notified: Pola Corn  Orders Received/Actions taken: awaiting orders   Noe Gens, RN

## 2018-03-23 NOTE — Progress Notes (Signed)
Fentanyl 14ml's wasted via sink witnessed by [employer], Charity fundraiser.

## 2018-03-24 ENCOUNTER — Other Ambulatory Visit: Payer: Self-pay

## 2018-03-24 ENCOUNTER — Inpatient Hospital Stay (HOSPITAL_COMMUNITY): Payer: Medicare Other

## 2018-03-24 DIAGNOSIS — J939 Pneumothorax, unspecified: Secondary | ICD-10-CM

## 2018-03-24 LAB — BASIC METABOLIC PANEL
Anion gap: 4 — ABNORMAL LOW (ref 5–15)
BUN: 20 mg/dL (ref 6–20)
CALCIUM: 7.7 mg/dL — AB (ref 8.9–10.3)
CO2: 25 mmol/L (ref 22–32)
Chloride: 118 mmol/L — ABNORMAL HIGH (ref 98–111)
Creatinine, Ser: 0.79 mg/dL (ref 0.61–1.24)
GLUCOSE: 96 mg/dL (ref 70–99)
Potassium: 4 mmol/L (ref 3.5–5.1)
Sodium: 147 mmol/L — ABNORMAL HIGH (ref 135–145)

## 2018-03-24 LAB — GLUCOSE, CAPILLARY
GLUCOSE-CAPILLARY: 87 mg/dL (ref 70–99)
GLUCOSE-CAPILLARY: 87 mg/dL (ref 70–99)
Glucose-Capillary: 83 mg/dL (ref 70–99)
Glucose-Capillary: 75 mg/dL (ref 70–99)
Glucose-Capillary: 77 mg/dL (ref 70–99)

## 2018-03-24 LAB — CBC
HEMATOCRIT: 41.1 % (ref 39.0–52.0)
Hemoglobin: 12.6 g/dL — ABNORMAL LOW (ref 13.0–17.0)
MCH: 27.6 pg (ref 26.0–34.0)
MCHC: 30.7 g/dL (ref 30.0–36.0)
MCV: 89.9 fL (ref 80.0–100.0)
PLATELETS: 155 10*3/uL (ref 150–400)
RBC: 4.57 MIL/uL (ref 4.22–5.81)
RDW: 15.4 % (ref 11.5–15.5)
WBC: 12.2 10*3/uL — ABNORMAL HIGH (ref 4.0–10.5)
nRBC: 0 % (ref 0.0–0.2)

## 2018-03-24 LAB — PHOSPHORUS: Phosphorus: 3.6 mg/dL (ref 2.5–4.6)

## 2018-03-24 LAB — MAGNESIUM: Magnesium: 2 mg/dL (ref 1.7–2.4)

## 2018-03-24 MED ORDER — LIDOCAINE HCL (PF) 1 % IJ SOLN
INTRAMUSCULAR | Status: AC
  Filled 2018-03-24: qty 30

## 2018-03-24 MED ORDER — LACTATED RINGERS IV SOLN
INTRAVENOUS | Status: DC
  Administered 2018-03-24: 17:00:00 via INTRAVENOUS

## 2018-03-24 MED ORDER — MIDAZOLAM HCL 2 MG/2ML IJ SOLN
INTRAMUSCULAR | Status: AC
  Administered 2018-03-24: 1 mg via INTRAVENOUS
  Filled 2018-03-24: qty 2

## 2018-03-24 MED ORDER — MIDAZOLAM HCL 2 MG/2ML IJ SOLN
1.0000 mg | Freq: Once | INTRAMUSCULAR | Status: AC
  Administered 2018-03-24: 1 mg via INTRAVENOUS

## 2018-03-24 MED ORDER — FENTANYL CITRATE (PF) 100 MCG/2ML IJ SOLN
12.5000 ug | Freq: Four times a day (QID) | INTRAMUSCULAR | Status: DC | PRN
  Administered 2018-03-24 – 2018-03-26 (×5): 12.5 ug via INTRAVENOUS
  Filled 2018-03-24 (×5): qty 2

## 2018-03-24 NOTE — Plan of Care (Signed)
  Interdisciplinary Goals of Care Family Meeting   Date carried out:: 03/24/2018  Location of the meeting: Bedside  Member's involved: Physician, Bedside Registered Nurse and Family Member or next of kin  Durable Power of Attorney or acting medical decision maker: Father, Mother    Discussion: We discussed goals of care for [employer] .  Explained that Cyprian is critically ill, frail and is not able to articulate his preferences due to mental status change.  We will put him back on life support, use Bipap if needed however we have agreed that in case his heart were to stop or he goes into cardiac arrest then we would not do CPR or defibrillations. The family agrees to this decision.  Continue goals of care discussion with palliative care team.  Code status: Limited code. Ok for intubation, bipap but no CPR or defibrillation   Disposition: Continue current acute care  Time spent for the meeting: 25 mins  Lotta Frankenfield 03/24/2018, 4:54 PM

## 2018-03-24 NOTE — Procedures (Signed)
Chest Tube Insertion Procedure Note  Indications:  Clinically significant Right Pneumothorax  Procedure Details  Unable to obtain informed consent due to emergent necessity.  After sterile skin prep, using standard technique, a Wayne catheter was placed in the right anterior subclavicular area  Estimated Blood Loss:  Minimal         Specimens:  None              Complications:  None; patient tolerated the procedure well.  Condition: Stable  Chilton Greathouse MD [place] [facility] 03/24/2018, 1:51 PM

## 2018-03-24 NOTE — Evaluation (Signed)
Clinical/Bedside Swallow Evaluation Patient Details  Name: Kevin Whitney MRN: [id number] Date of Birth: [birth year]  Today's Date: 03/24/2018 Time: SLP Start Time (ACUTE ONLY): 1030 SLP Stop Time (ACUTE ONLY): 1040 SLP Time Calculation (min) (ACUTE ONLY): 10 min  Past Medical History:  Past Medical History:  Diagnosis Date  . Acute blood loss anemia 12/2017  . Bacteremia   . Broken legs   . CHF (congestive heart failure) (HCC)   . Chronic pain   . Chronic pain syndrome   . Complication of anesthesia    " i hallucinate "  . DDD (degenerative disc disease), lumbar   . 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 MRSA (methicillin resistant staph aureus) culture positive    2003   . Pressure injury of skin of sacral region   . Protein calorie malnutrition (HCC)   . Staph infection   . UTI (urinary tract infection) 12/2017   Past Surgical History:  Past Surgical History:  Procedure Laterality Date  . AMPUTATION Left 01/06/2018   Procedure: AMPUTATION ABOVE KNEE;  Surgeon: Nadara Mustard, MD;  Location: [facility] OR;  Service: Orthopedics;  Laterality: Left;  . BACK SURGERY    . DEBRIDMENT OF DECUBITUS ULCER N/A 01/07/2018   Procedure: DEBRIDMENT OF DECUBITUS ULCER;  Surgeon: Manus Rudd, MD;  Location: [facility] OR;  Service: General;  Laterality: N/A;  . FEMUR SURGERY     rt rod  . IR FLUORO GUIDE CV LINE RIGHT  01/07/2018  . IR US GUIDE VASC ACCESS RIGHT  01/07/2018  . surgery every 3 wks for mrsa    . WRIST SURGERY     bilaterally   HPI:  Pt is a 57 year old male with a lengthy past medical history including recent MRSA bacteremia related to a wound returns to our facility with acute respiratory failure with hypoxemia and severe sepsis from healthcare associated pneumonia on March 20, 2018.  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  Pt briefly intubated 11/8-9, under 48 hours.  Abnormal CXR 11/10   Assessment / Plan / Recommendation Clinical Impression  Pt presents with moderate risk for aspiraiton in setting of HCAP with recent intubation.  Pt's voice is very weak with extremely poor volitional cough.  Pt is confused and was unable to follow directions consistently limiting oral motor assessment; however, pt appears weak and deconditioned.  Pt tolerated ice chip without clinical s/s of aspiraiton.  With small amount of thin liquid by spoon, there were multiple swallows and seemingly wet breath sounds.  Pt did not phonate on command to assess vocal quality.  Pt tolerated puree without overt s/s of aspiraiton, but required cuing to close lips around spoon for bolus stripping.  Recommend pt remain NPO pending instrumental assessment of swallowing.  Allow ice chips for comfort in moderation, after good oral care, when fully awake/alert, with upright positioning and supervision.  Priority oral medications may be given with puree. SLP Visit Diagnosis: Dysphagia, oropharyngeal phase (R13.12)    Aspiration Risk  Moderate aspiration risk    Diet Recommendation NPO except meds;Ice chips PRN after oral care   Medication Administration: Crushed with puree    Other  Recommendations Oral Care Recommendations: Oral care BID     Swallow Study   General Date of Onset: 03/20/18 HPI: Pt is  a 57 year old male with a lengthy past medical history including recent MRSA bacteremia related to a wound returns to our facility with acute respiratory failure with hypoxemia and severe sepsis from healthcare associated pneumonia on March 20, 2018.  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  Pt briefly intubated 11/8-9, under 48 hours.  Abnormal CXR 11/10 Type of Study: Bedside Swallow Evaluation Diet Prior to this Study: NPO Temperature Spikes Noted: No Respiratory Status: Nasal cannula History of Recent Intubation: Yes Length of Intubations (days): 2 days Date extubated: 03/23/18 Behavior/Cognition: Alert;Requires cueing;Doesn't follow directions Oral Cavity Assessment: Within Functional Limits Oral Care Completed by SLP: No Oral Cavity - Dentition: Edentulous Self-Feeding Abilities: Total assist Patient Positioning: Upright in bed Baseline Vocal Quality: Breathy;Low vocal intensity Volitional Cough: Weak Volitional Swallow: Unable to elicit    Oral/Motor/Sensory Function Overall Oral Motor/Sensory Function: Moderate impairment Facial ROM: (unable to test) Facial Symmetry: Within Functional Limits Lingual ROM: Reduced right;Reduced left Lingual Symmetry: Within Functional Limits Lingual Strength: (unable to test) Velum: (unable to test)   Ice Chips Ice chips: Within functional limits Presentation: Spoon   Thin Liquid Thin Liquid: Impaired Presentation: Spoon Pharyngeal  Phase Impairments: Multiple swallows;Decreased hyoid-laryngeal movement;Suspected delayed Swallow    Nectar Thick Nectar Thick Liquid: Not tested   Honey Thick Honey Thick Liquid: Not tested   Puree Puree: Impaired Presentation: Spoon Oral Phase Impairments: Reduced labial seal;Poor awareness of bolus   Solid     Solid: Not tested      Kerrie Pleasure, MA, CCC-SLP Acute Rehabilitation Services Office: [phone], Pager (11/10): [phone] 03/24/2018,10:58 AM

## 2018-03-24 NOTE — Procedures (Addendum)
Chest Tube Insertion Procedure Note  Indications:  Clinically significant Right Pneumothorax  Procedure Details  Unable to obtain informed consent due to emergent necessity.  After sterile skin prep, using standard technique, a Wayne catheter was placed in the right mid axillary space  Estimated Blood Loss:  Minimal         Specimens:  None              Complications:  None; patient tolerated the procedure well.  Condition: Stable  Chilton Greathouse MD [facility] 03/24/2018, 1:51 PM

## 2018-03-24 NOTE — Progress Notes (Addendum)
NAME:  Kevin Whitney, MRN:  [id number], DOB:  07-03-1960, LOS: 4 ADMISSION DATE:  03/20/2018, CONSULTATION DATE:  03/20/2018 REFERRING MD: Dr. 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, history of MRSA hardware infection spine, long history of skin and soft tissue wound left chest wall, opiate dependence   Significant Hospital Events   11/6 Admission 11/7 Transfer out of icu after tx with HFNC, pressors, ABX for HCAP 11/8 MS worse, not protecting airway, transferred back to ICU, intubated 11/9 Small right pneumothorax, extubated 11/10 Pneumothorax worse, chest tube placed  Consults: date of consult/date signed off & final recs:  11/6 > Wound ostomy consult  Procedures (surgical and bedside):  ETT 11/8 > 11/9 Rt chest tube 11/10 >  Significant Diagnostic Tests:  11/6 blood cultures-negative  Micro Data:    Antimicrobials:  11/6 vancomycin > 11/6 Zosyn > 11/6 ceftriaxone x1 11/6 azithromycin x1  INTERVAL HISTORY:  Tachypnea, more confused. Denies any dyspnea, chest pain.  Objective   Blood pressure (!) 141/92, pulse 78, temperature 97.8 F (36.6 C), temperature source Axillary, resp. rate (!) 38, height 6\' 1"  (1.854 m), weight 65 kg, SpO2 98 %.        Intake/Output Summary (Last 24 hours) at 03/24/2018 1237 Last data filed at 03/24/2018 1200 Gross per 24 hour  Intake 1326.09 ml  Output 761 ml  Net 565.09 ml   Filed Weights   03/21/18 0500 03/21/18 1634 03/24/18 0500  Weight: 60 kg 64 kg 65 kg   Examination: Gen:      Mild distress,  frail, catchetic HEENT:  EOMI, sclera anicteric Neck:     No masses; no thyromegaly Lungs:    Tachypnea, clear lungs. CV:         Regular rate and rhythm; no murmurs Abd:      + bowel sounds; soft, non-tender; no palpable masses, no distension Ext:    No edema; adequate peripheral perfusion Skin:      Warm and dry; no rash Neuro: Moves all extremities, no focal deficits.  Resolved Hospital Problem list     Assessment & Plan:  Respiratory failure Right pneumothorax Chest tubes placed.  Follow chest x-ray.  Sepsis due to healthcare associated pneumonia now complicated by aspiration, pneumothorax Vanco, Zosyn Follow cultures Off pressors. Stop stress dose steroids..  Recent MRSA infection, multiple wounds Wound care Antibiotics as above  AKI, hypokalemia Follow BUN/creatinine. Replete lites.  Narcotic dependence Chronic pain/spasticity Taken of the [employer] home medication  Peripheral vascular disease Continue statin  GERD Pepcid  Protein calorie malnutrition, severe NPO for now, aspiration risk  Goals of care Spoke with mother and father at bedside 11/8.  He apparently has good quality of life at home and is able to make his own decisions They prefer that we discuss with him when he is more awake. Will place consult for palliative care He remains full code for now.  Disposition / Summary of Today's Plan 03/24/18   Chest tube placed for pneumothorax Continue antibiotics. Follow chest x-ray    Diet: NPO Pain/Anxiety/Delirium protocol (if indicated): Holding sedation VAP protocol (if indicated): NA DVT prophylaxis:  sub q hep GI prophylaxis: famotidine Hyperglycemia protocol: n/a Mobility: BR Code Status: Full Family Communication: Family updated 11/9.  No family at bedside 10/10  The patient is critically ill with multiple organ system failure and requires high complexity decision making for assessment and support, frequent evaluation and  titration of therapies, advanced monitoring, review of radiographic studies and interpretation of complex data.   Critical Care Time devoted to patient care services, exclusive of separately billable procedures, described in this note is 35 minutes.   Chilton Greathouse MD [facility] Pager [phone] If no answer or after 3pm call: [phone] 03/24/2018, 12:37 PM

## 2018-03-25 ENCOUNTER — Inpatient Hospital Stay (HOSPITAL_COMMUNITY): Payer: Medicare Other

## 2018-03-25 LAB — BASIC METABOLIC PANEL
ANION GAP: 4 — AB (ref 5–15)
ANION GAP: 4 — AB (ref 5–15)
BUN: 14 mg/dL (ref 6–20)
BUN: 11 mg/dL (ref 6–20)
CALCIUM: 7.8 mg/dL — AB (ref 8.9–10.3)
CO2: 27 mmol/L (ref 22–32)
CO2: 28 mmol/L (ref 22–32)
CREATININE: 0.69 mg/dL (ref 0.61–1.24)
Calcium: 7.3 mg/dL — ABNORMAL LOW (ref 8.9–10.3)
Chloride: 116 mmol/L — ABNORMAL HIGH (ref 98–111)
Chloride: 114 mmol/L — ABNORMAL HIGH (ref 98–111)
Creatinine, Ser: 0.63 mg/dL (ref 0.61–1.24)
GFR calc Af Amer: >60 mL/min (ref >60)
GFR calc non Af Amer: >60 mL/min (ref >60)
Glucose, Bld: 96 mg/dL (ref 70–99)
Glucose, Bld: 111 mg/dL — ABNORMAL HIGH (ref 70–99)
POTASSIUM: 2.9 mmol/L — AB (ref 3.5–5.1)
Potassium: 3.4 mmol/L — ABNORMAL LOW (ref 3.5–5.1)
SODIUM: 147 mmol/L — AB (ref 135–145)
SODIUM: 146 mmol/L — AB (ref 135–145)

## 2018-03-25 LAB — GLUCOSE, CAPILLARY
GLUCOSE-CAPILLARY: 81 mg/dL (ref 70–99)
GLUCOSE-CAPILLARY: 64 mg/dL — AB (ref 70–99)
GLUCOSE-CAPILLARY: 87 mg/dL (ref 70–99)
Glucose-Capillary: 65 mg/dL — ABNORMAL LOW (ref 70–99)
Glucose-Capillary: 77 mg/dL (ref 70–99)
Glucose-Capillary: 82 mg/dL (ref 70–99)
Glucose-Capillary: 87 mg/dL (ref 70–99)
Glucose-Capillary: 89 mg/dL (ref 70–99)

## 2018-03-25 LAB — CBC
HCT: 33.8 % — ABNORMAL LOW (ref 39.0–52.0)
Hemoglobin: 10.1 g/dL — ABNORMAL LOW (ref 13.0–17.0)
MCH: 26.7 pg (ref 26.0–34.0)
MCHC: 29.9 g/dL — ABNORMAL LOW (ref 30.0–36.0)
MCV: 89.4 fL (ref 80.0–100.0)
NRBC: 0 % (ref 0.0–0.2)
PLATELETS: 185 10*3/uL (ref 150–400)
RBC: 3.78 MIL/uL — AB (ref 4.22–5.81)
RDW: 15.2 % (ref 11.5–15.5)
WBC: 14.9 10*3/uL — ABNORMAL HIGH (ref 4.0–10.5)

## 2018-03-25 LAB — MAGNESIUM
MAGNESIUM: 2 mg/dL (ref 1.7–2.4)
Magnesium: 2 mg/dL (ref 1.7–2.4)

## 2018-03-25 LAB — CULTURE, BLOOD (ROUTINE X 2)
Culture: NO GROWTH
Culture: NO GROWTH

## 2018-03-25 LAB — PHOSPHORUS
PHOSPHORUS: 2.9 mg/dL (ref 2.5–4.6)
PHOSPHORUS: 2.5 mg/dL (ref 2.5–4.6)

## 2018-03-25 MED ORDER — FENTANYL CITRATE (PF) 100 MCG/2ML IJ SOLN
12.5000 ug | Freq: Once | INTRAMUSCULAR | Status: DC
  Filled 2018-03-25: qty 2

## 2018-03-25 MED ORDER — VITAL HIGH PROTEIN PO LIQD: 1000.0000 mL | ORAL | Status: DC

## 2018-03-25 MED ORDER — ASPIRIN 81 MG PO CHEW: 81.0000 mg | CHEWABLE_TABLET | Freq: Every day | ORAL | Status: DC

## 2018-03-25 MED ORDER — DEXTROSE 50 % IV SOLN
25.0000 mL | Freq: Once | INTRAVENOUS | Status: AC
  Administered 2018-03-25: 25 mL via INTRAVENOUS

## 2018-03-25 MED ORDER — DEXTROSE 50 % IV SOLN
INTRAVENOUS | Status: AC
  Filled 2018-03-25: qty 50

## 2018-03-25 MED ORDER — DEXTROSE 50 % IV SOLN
INTRAVENOUS | Status: AC
  Administered 2018-03-25: 50 mL
  Filled 2018-03-25: qty 50

## 2018-03-25 MED ORDER — POTASSIUM CHLORIDE 10 MEQ/100ML IV SOLN
10.0000 meq | INTRAVENOUS | Status: AC
  Administered 2018-03-25 (×8): 10 meq via INTRAVENOUS
  Filled 2018-03-25 (×8): qty 100

## 2018-03-25 MED ORDER — ASPIRIN 81 MG PO CHEW
81.0000 mg | CHEWABLE_TABLET | Freq: Every day | ORAL | Status: DC
  Administered 2018-03-25 – 2018-03-29 (×5): 81 mg
  Filled 2018-03-25 (×5): qty 1

## 2018-03-25 MED ORDER — VITAL AF 1.2 CAL PO LIQD
1000.0000 mL | ORAL | Status: DC
  Administered 2018-03-25: 1500 mL
  Administered 2018-03-27 – 2018-03-29 (×3): 1000 mL
  Filled 2018-03-25: qty 1500
  Filled 2018-03-25 (×2): qty 1000

## 2018-03-25 MED ORDER — PRO-STAT SUGAR FREE PO LIQD: 30.0000 mL | Freq: Two times a day (BID) | ORAL | Status: DC

## 2018-03-25 MED ORDER — KCL IN DEXTROSE-NACL 30-5-0.45 MEQ/L-%-% IV SOLN
INTRAVENOUS | Status: DC
  Administered 2018-03-25 – 2018-03-28 (×5): via INTRAVENOUS
  Filled 2018-03-25 (×5): qty 1000

## 2018-03-25 NOTE — Procedures (Signed)
Cortrak  Person Inserting Tube:  Jihad Brownlow, RD Tube Type:  Cortrak - 43 inches Tube Location:  Right nare Initial Placement:  Stomach Secured by: Bridle Technique Used to Measure Tube Placement:  Documented cm marking at nare/ corner of mouth Cortrak Secured At:  71 cm   X-ray is required, abdominal x-ray has been ordered by the Cortrak team. Please confirm tube placement before using the Cortrak tube.   If the tube becomes dislodged please keep the tube and contact the Cortrak team at [email or web address] ([email or web address]) for replacement.  If after hours and replacement cannot be delayed, place a NG tube and confirm placement with an abdominal x-ray.    Vanessa Kick RD, LDN Clinical Nutrition Pager # [phone]

## 2018-03-25 NOTE — Procedures (Signed)
Objective Swallowing Evaluation: Type of Study: FEES-Fiberoptic Endoscopic Evaluation of Swallow   Patient Details  Name: Kevin Whitney MRN: [id number] Date of Birth: [birth year]  Today's Date: 03/25/2018 Time: SLP Start Time (ACUTE ONLY): 1016 -SLP Stop Time (ACUTE ONLY): 1045  SLP Time Calculation (min) (ACUTE ONLY): 29 min   Past Medical History:  Past Medical History:  Diagnosis Date  . Acute blood loss anemia 12/2017  . Bacteremia   . Broken legs   . CHF (congestive heart failure) (HCC)   . Chronic pain   . Chronic pain syndrome   . Complication of anesthesia    " i hallucinate "  . DDD (degenerative disc disease), lumbar   . 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 MRSA (methicillin resistant staph aureus) culture positive    2003   . Pressure injury of skin of sacral region   . Protein calorie malnutrition (HCC)   . Staph infection   . UTI (urinary tract infection) 12/2017   Past Surgical History:  Past Surgical History:  Procedure Laterality Date  . AMPUTATION Left 01/06/2018   Procedure: AMPUTATION ABOVE KNEE;  Surgeon: Nadara Mustard, MD;  Location: [facility] OR;  Service: Orthopedics;  Laterality: Left;  . BACK SURGERY    . DEBRIDMENT OF DECUBITUS ULCER N/A 01/07/2018   Procedure: DEBRIDMENT OF DECUBITUS ULCER;  Surgeon: Manus Rudd, MD;  Location: [facility] OR;  Service: General;  Laterality: N/A;  . FEMUR SURGERY     rt rod  . IR FLUORO GUIDE CV LINE RIGHT  01/07/2018  . IR US GUIDE VASC ACCESS RIGHT  01/07/2018  . surgery every 3 wks for mrsa    . WRIST SURGERY     bilaterally   HPI: Pt is a 57 year old male with a lengthy past medical history including recent MRSA bacteremia related to a wound returns to our facility with acute respiratory failure with hypoxemia and severe sepsis from healthcare associated pneumonia on March 20, 2018.  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  Pt briefly intubated 11/8-9, under 48 hours.  Abnormal CXR 11/10   Subjective: Pt awake, seemingly confused, intermittently follows directions    Assessment / Plan / Recommendation  CHL IP CLINICAL IMPRESSIONS 03/25/2018  Clinical Impression Pt has a moderate-severe oropharyngeal dysphagia, likely multifactorial in nature given positioning, deconditioning, and cognitive stattus. He does not consistently follow commands, and does not perform a swallow to command despite cueing. He tends to hold his neck in extension with a mouth-open posture, with decreased bolus cohesion and Mod cues needed to initiate posterior transit. Premature spillage occurs across consistencies, with thin liquids spilling into the trachea before the swallow. Deep penetration to the vocal folds occues with nectar thick liquids and even purees. Pt has a consistent throat clear and/or weak cough in response to penetration/aspiration, but it is not strong enough to clear the airway. Moderate residue remains diffusely in the pharynx, most significant with thicker textures, with residue spilling into the laryngeal vestibule through the interarytenoid space. Again, pt tries to clear his throat in response, but it repeatedly spills back in toward his glottis. Recommend that pt remain NPO with consideration for temporary alternative means of nutrition. Will f/u for PO trials and possible exercises targeting pharyngeal musculature and cough if mentation will allow. Pt  will likely need reconditioning and improved mentation before the is ready for a PO diet.  SLP Visit Diagnosis Dysphagia, oropharyngeal phase (R13.12)  Attention and concentration deficit following --  Frontal lobe and executive function deficit following --  Impact on safety and function Moderate aspiration  risk;Severe aspiration risk      CHL IP TREATMENT RECOMMENDATION 03/25/2018  Treatment Recommendations Therapy as outlined in treatment plan below     Prognosis 03/25/2018  Prognosis for Safe Diet Advancement Good  Barriers to Reach Goals Cognitive deficits  Barriers/Prognosis Comment --    CHL IP DIET RECOMMENDATION 03/25/2018  SLP Diet Recommendations NPO;Alternative means - temporary  Liquid Administration via --  Medication Administration Via alternative means  Compensations --  Postural Changes --      CHL IP OTHER RECOMMENDATIONS 03/25/2018  Recommended Consults --  Oral Care Recommendations Oral care QID  Other Recommendations --      CHL IP FOLLOW UP RECOMMENDATIONS 03/25/2018  Follow up Recommendations (No Data)      CHL IP FREQUENCY AND DURATION 03/25/2018  Speech Therapy Frequency (ACUTE ONLY) min 2x/week  Treatment Duration 2 weeks           CHL IP ORAL PHASE 03/25/2018  Oral Phase Impaired  Oral - Pudding Teaspoon --  Oral - Pudding Cup --  Oral - Honey Teaspoon --  Oral - Honey Cup --  Oral - Nectar Teaspoon Decreased bolus cohesion;Delayed oral transit;Premature spillage  Oral - Nectar Cup --  Oral - Nectar Straw --  Oral - Thin Teaspoon Decreased bolus cohesion;Delayed oral transit;Premature spillage;Other (Comment)  Oral - Thin Cup --  Oral - Thin Straw --  Oral - Puree Decreased bolus cohesion;Delayed oral transit;Premature spillage  Oral - Mech Soft --  Oral - Regular --  Oral - Multi-Consistency --  Oral - Pill --  Oral Phase - Comment --    CHL IP PHARYNGEAL PHASE 03/25/2018  Pharyngeal Phase Impaired  Pharyngeal- Pudding Teaspoon --  Pharyngeal --  Pharyngeal- Pudding Cup --  Pharyngeal --  Pharyngeal- Honey Teaspoon --  Pharyngeal --  Pharyngeal- Honey Cup --  Pharyngeal --  Pharyngeal- Nectar Teaspoon Reduced pharyngeal peristalsis;Reduced epiglottic inversion;Reduced anterior laryngeal mobility;Reduced laryngeal  elevation;Reduced tongue base retraction;Penetration/Aspiration before swallow;Pharyngeal residue - valleculae;Pharyngeal residue - pyriform;Pharyngeal residue - posterior pharnyx;Lateral channel residue;Inter-arytenoid space residue;Penetration/Apiration after swallow  Pharyngeal Material enters airway, CONTACTS cords and not ejected out  Pharyngeal- Nectar Cup --  Pharyngeal --  Pharyngeal- Nectar Straw --  Pharyngeal --  Pharyngeal- Thin Teaspoon Reduced pharyngeal peristalsis;Reduced epiglottic inversion;Reduced anterior laryngeal mobility;Reduced laryngeal elevation;Reduced tongue base retraction;Penetration/Aspiration before swallow;Pharyngeal residue - valleculae;Pharyngeal residue - pyriform;Pharyngeal residue - posterior pharnyx;Lateral channel residue;Inter-arytenoid space residue  Pharyngeal Material enters airway, passes BELOW cords and not ejected out despite cough attempt by patient  Pharyngeal- Thin Cup --  Pharyngeal --  Pharyngeal- Thin Straw --  Pharyngeal --  Pharyngeal- Puree Reduced pharyngeal peristalsis;Reduced epiglottic inversion;Reduced anterior laryngeal mobility;Reduced laryngeal elevation;Reduced tongue base retraction;Penetration/Aspiration before swallow;Pharyngeal residue - valleculae;Pharyngeal residue - pyriform;Pharyngeal residue - posterior pharnyx;Lateral channel residue;Inter-arytenoid space residue;Penetration/Apiration after swallow  Pharyngeal Material enters airway, CONTACTS cords and not ejected out  Pharyngeal- Mechanical Soft --  Pharyngeal --  Pharyngeal- Regular --  Pharyngeal --  Pharyngeal- Multi-consistency --  Pharyngeal --  Pharyngeal- Pill --  Pharyngeal --  Pharyngeal Comment --     CHL IP CERVICAL ESOPHAGEAL PHASE 03/25/2018  Cervical Esophageal Phase WFL  Pudding Teaspoon --  Pudding Cup --  Honey Teaspoon --  Honey Cup --  Nectar Teaspoon --  Nectar Cup --  Nectar Straw --  Thin Teaspoon --  Thin Cup --  Thin Straw --   Puree --  Mechanical Soft --  Regular --  Multi-consistency --  Pill --  Cervical Esophageal Comment --     Maxcine Ham 03/25/2018, 11:25 AM    Maxcine Ham, M.A. CCC-SLP Acute Herbalist [phone] Office [phone]

## 2018-03-25 NOTE — Progress Notes (Signed)
Nutrition Follow-up  DOCUMENTATION CODES:   Severe malnutrition in context of chronic illness  INTERVENTION:   Tube Feeding:  Vital AF 1.2 @ 65 ml/hr Begin at 20 ml/hr; if tolerating, titrate by 10 mL q 8 hours until goal rate of 65 ml/hr Provides 1872 kcals, 117 g of protein and 1264 mL of free water  Continue MVI with minerals Continue Vitamin C 250 mg BID   NUTRITION DIAGNOSIS:   Severe Malnutrition related to chronic illness as evidenced by energy intake < 75% for > or equal to 1 month, severe fat depletion, severe muscle depletion.  Being addressed as Cortrak placed with initiation of TF  GOAL:   Patient will meet greater than or equal to 90% of their needs  Progressing  MONITOR:   Vent status, Labs, Weight trends, Skin  REASON FOR ASSESSMENT:   Consult, Ventilator Enteral/tube feeding initiation and management  ASSESSMENT:   57 yo male admitted with acute respiratory failure and severe sepsis related to HCAP, AKI. Pt initially on HFNC but decompensated on 11/8 requiring intubation. Pt with recent admission for MRSA bacteremia related to wounds. PMH includes spinal stenosis, L AKA, multiple MRSA infections, wheelchair bound, PVD, narcotic dependence  11/06 Admitted 11/08 Intubated, TF initiated at rate of 20 ml/hr with titration orders 11/09 Extubated 11/10  Palliative Care consulted 11/11 FEES performed, NPO, Cortrak placed, TF initiated  Hypoglycemic this AM, hypernatremia persists; IVF started this AM FEES performed, NPO status recommended Cortrak tube placed with tip in stomach   Net +8 L since admission; +edema in UE, Unsure of dry weight as pt dehydrated on admission.   Labs: phosphorus wdl, potassium 2.9 (L), sodium 147 (H), magnesium wdl, Creatinine wdl, CBGs 40-98 Meds: D5-1/2 NS with KCL at 50 ml/hr, MVI, Vitamin C, colace  Diet Order:   Diet Order            Diet NPO time specified  Diet effective now              EDUCATION NEEDS:    Not appropriate for education at this time  Skin:  Skin Assessment: Skin Integrity Issues: Skin Integrity Issues:: Stage II, Stage III, DTI DTI: thoracic spine, buttock, right lateral rib cage x 4 Stage II: left upper buttock/sacrum Stage III: left scapula  Last BM:  11/11  Height:   Ht Readings from Last 1 Encounters:  03/20/18 6\' 1"  (1.854 m)    Weight:   Wt Readings from Last 1 Encounters:  03/24/18 65 kg    Ideal Body Weight:     BMI:  Body mass index is 18.91 kg/m.  Estimated Nutritional Needs:   Kcal:  1800-2000 kcals   Protein:  90-115 g  Fluid:  >/= 1.8 L   Romelle Starcher MS, RD, LDN, CNSC [phone] Pager  [phone] Weekend/On-Call Pager

## 2018-03-25 NOTE — Progress Notes (Signed)
..  [facility] ADULT ICU REPLACEMENT PROTOCOL FOR AM LAB REPLACEMENT ONLY  The patient does apply for the [facility] Adult ICU Electrolyte Replacment Protocol based on the criteria listed below:   1. Is GFR >/= 40 ml/min? Yes.    Patient's GFR today is >60 2. Is urine output >/= 0.5 ml/kg/hr for the last 6 hours? Yes.   Patient's UOP is 0.7 ml/kg/hr 3. Is BUN < 60 mg/dL? Yes.    Patient's BUN today is 14 4. Abnormal electrolyte(s): K+ 2.9 5. Ordered repletion with: protocol 6. If a panic level lab has been reported, has the CCM MD in charge been notified? Yes.  .   Physician:  Dr. Althia Forts, Lilia Argue 03/25/2018 4:29 AM

## 2018-03-25 NOTE — Progress Notes (Signed)
   NAME:  Kevin Whitney, MRN:  [id number], DOB:  08-29-1960, LOS: 5 ADMISSION DATE:  03/20/2018, CONSULTATION DATE:  03/20/2018 REFERRING MD: Dr. 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, history of MRSA hardware infection spine, long history of skin and soft tissue wound left chest wall, opiate dependence   Significant Hospital Events   11/6 Admission 11/7 Transfer out of icu after tx with HFNC, pressors, ABX for HCAP 11/8 MS worse, not protecting airway, transferred back to ICU, intubated 11/9 Small right pneumothorax, extubated 11/10 Pneumothorax worse, chest tube placed  Consults: date of consult/date signed off & final recs:  11/6 > Wound ostomy consult  Procedures (surgical and bedside):  ETT 11/8 > 11/9 Rt chest tube x2 11/10 >  Significant Diagnostic Tests:  11/6 blood cultures-negative  Micro Data:    Antimicrobials:  11/6 vancomycin > 11/6 Zosyn > 11/6 ceftriaxone x1 11/6 azithromycin x1  INTERVAL HISTORY:  Chest tubes placed yesterday    Objective   Blood pressure 140/76, pulse (!) 54, temperature 98.3 F (36.8 C), temperature source Axillary, resp. rate (!) 27, height 6\' 1"  (1.854 m), weight 65 kg, SpO2 100 %.        Intake/Output Summary (Last 24 hours) at 03/25/2018 1108 Last data filed at 03/25/2018 0900 Gross per 24 hour  Intake 1709.03 ml  Output 2010 ml  Net -300.97 ml   Filed Weights   03/21/18 0500 03/21/18 1634 03/24/18 0500  Weight: 60 kg 64 kg 65 kg   Examination:  General: Chronically ill appearing resting  comfortably in bed HENT: NCAT MM dry PULM: CTA B, normal effort CV: RRR, no mgr GI: BS+, soft, nontender MSK: normal bulk and tone Neuro: awake, confused   Resolved Hospital Problem list     Assessment & Plan:  Right acute pneumothorax, spontaneous (related to positive pressure while on vent?) > chest tubes to water seal > repeat CXR in AM   Sepsis due to healthcare associated pneumonia now complicated by aspiration Recent MRSA infection, multiple wounds Continue wound care Stop vanc Continue zosyn through 11/12 then stop  AKI: improving Hypokalemia: still low Monitor BMET and UOP Replace electrolytes as needed Start D5 1/2 NS with 30 mEq KCl  Narcotic dependence Chronic pain/spasticity Continue fentanyl prn Hold duragesix  Peripheral vascular disease Continue statin  GERD Pepcid  Protein calorie malnutrition, severe NPO Cor trak today: tube feedings   Goals of care Partial code, no chest compressions or shocks but intubation OK; palliative consulted.  I think that full DNR would be reasonable  Disposition / Summary of Today's Plan 03/25/18   Read above To TRH    Diet: tube feeding Pain/Anxiety/Delirium protocol (if indicated):n/a VAP protocol (if indicated): NA DVT prophylaxis: sub q hep GI prophylaxis: famotidine Hyperglycemia protocol: n/a Mobility: BR Code Status: Full Family Communication: parents updated bedside on 11/11   Heber [employer], MD [place] [facility] Pager: [phone] Cell: [phone] After 3pm or if no response, call [phone]  03/25/2018, 11:08 AM

## 2018-03-26 ENCOUNTER — Inpatient Hospital Stay (HOSPITAL_COMMUNITY): Payer: Medicare Other

## 2018-03-26 DIAGNOSIS — T17908A Unspecified foreign body in respiratory tract, part unspecified causing other injury, initial encounter: Secondary | ICD-10-CM

## 2018-03-26 DIAGNOSIS — Z515 Encounter for palliative care: Secondary | ICD-10-CM

## 2018-03-26 LAB — GLUCOSE, CAPILLARY
GLUCOSE-CAPILLARY: 136 mg/dL — AB (ref 70–99)
GLUCOSE-CAPILLARY: 122 mg/dL — AB (ref 70–99)
Glucose-Capillary: 115 mg/dL — ABNORMAL HIGH (ref 70–99)
Glucose-Capillary: 128 mg/dL — ABNORMAL HIGH (ref 70–99)
Glucose-Capillary: 84 mg/dL (ref 70–99)
Glucose-Capillary: 94 mg/dL (ref 70–99)

## 2018-03-26 LAB — CBC WITH DIFFERENTIAL/PLATELET
ABS IMMATURE GRANULOCYTES: 0.25 10*3/uL — AB (ref 0.00–0.07)
Basophils Absolute: 0 10*3/uL (ref 0.0–0.1)
Basophils Relative: 0 %
EOS PCT: 1 %
Eosinophils Absolute: 0.2 10*3/uL (ref 0.0–0.5)
HCT: 37.9 % — ABNORMAL LOW (ref 39.0–52.0)
HEMOGLOBIN: 11.2 g/dL — AB (ref 13.0–17.0)
Immature Granulocytes: 1 %
LYMPHS PCT: 4 %
Lymphs Abs: 0.9 10*3/uL (ref 0.7–4.0)
MCH: 26.3 pg (ref 26.0–34.0)
MCHC: 29.6 g/dL — AB (ref 30.0–36.0)
MCV: 89 fL (ref 80.0–100.0)
MONO ABS: 0.8 10*3/uL (ref 0.1–1.0)
MONOS PCT: 4 %
NEUTROS ABS: 19.8 10*3/uL — AB (ref 1.7–7.7)
Neutrophils Relative %: 90 %
Platelets: 242 10*3/uL (ref 150–400)
RBC: 4.26 MIL/uL (ref 4.22–5.81)
RDW: 14.9 % (ref 11.5–15.5)
WBC: 22.1 10*3/uL — AB (ref 4.0–10.5)
nRBC: 0 % (ref 0.0–0.2)

## 2018-03-26 LAB — BASIC METABOLIC PANEL
ANION GAP: 2 — AB (ref 5–15)
Anion gap: 4 — ABNORMAL LOW (ref 5–15)
BUN: 11 mg/dL (ref 6–20)
BUN: 10 mg/dL (ref 6–20)
CALCIUM: 7.6 mg/dL — AB (ref 8.9–10.3)
CALCIUM: 7.7 mg/dL — AB (ref 8.9–10.3)
CO2: 27 mmol/L (ref 22–32)
CO2: 30 mmol/L (ref 22–32)
CREATININE: 0.52 mg/dL — AB (ref 0.61–1.24)
Chloride: 112 mmol/L — ABNORMAL HIGH (ref 98–111)
Chloride: 114 mmol/L — ABNORMAL HIGH (ref 98–111)
Creatinine, Ser: 0.57 mg/dL — ABNORMAL LOW (ref 0.61–1.24)
GFR calc Af Amer: >60 mL/min (ref >60)
GLUCOSE: 143 mg/dL — AB (ref 70–99)
Glucose, Bld: 125 mg/dL — ABNORMAL HIGH (ref 70–99)
POTASSIUM: 3 mmol/L — AB (ref 3.5–5.1)
Potassium: 3.3 mmol/L — ABNORMAL LOW (ref 3.5–5.1)
Sodium: 143 mmol/L (ref 135–145)
Sodium: 146 mmol/L — ABNORMAL HIGH (ref 135–145)

## 2018-03-26 LAB — PHOSPHORUS
Phosphorus: 1.3 mg/dL — ABNORMAL LOW (ref 2.5–4.6)
Phosphorus: 1.4 mg/dL — ABNORMAL LOW (ref 2.5–4.6)

## 2018-03-26 LAB — POCT I-STAT 3, ART BLOOD GAS (G3+)
ACID-BASE EXCESS: 6 mmol/L — AB (ref 0.0–2.0)
BICARBONATE: 28.7 mmol/L — AB (ref 20.0–28.0)
O2 Saturation: 99 %
PO2 ART: 103 mmHg (ref 83.0–108.0)
TCO2: 30 mmol/L (ref 22–32)
pCO2 arterial: 34.9 mmHg (ref 32.0–48.0)
pH, Arterial: 7.523 — ABNORMAL HIGH (ref 7.350–7.450)

## 2018-03-26 LAB — MAGNESIUM
Magnesium: 1.9 mg/dL (ref 1.7–2.4)
Magnesium: 1.9 mg/dL (ref 1.7–2.4)

## 2018-03-26 MED ORDER — ALBUTEROL SULFATE (2.5 MG/3ML) 0.083% IN NEBU
2.5000 mg | INHALATION_SOLUTION | Freq: Four times a day (QID) | RESPIRATORY_TRACT | Status: DC | PRN
  Administered 2018-03-26 – 2018-03-28 (×8): 2.5 mg via RESPIRATORY_TRACT
  Filled 2018-03-26 (×9): qty 3

## 2018-03-26 MED ORDER — PANTOPRAZOLE SODIUM 40 MG IV SOLR
40.0000 mg | Freq: Every day | INTRAVENOUS | Status: DC
  Administered 2018-03-26 – 2018-03-29 (×4): 40 mg via INTRAVENOUS
  Filled 2018-03-26 (×4): qty 40

## 2018-03-26 MED ORDER — FENTANYL CITRATE (PF) 100 MCG/2ML IJ SOLN
50.0000 ug | Freq: Once | INTRAMUSCULAR | Status: AC
  Administered 2018-03-26: 50 ug via INTRAVENOUS

## 2018-03-26 MED ORDER — ORAL CARE MOUTH RINSE
15.0000 mL | OROMUCOSAL | Status: DC
  Administered 2018-03-27 – 2018-03-28 (×20): 15 mL via OROMUCOSAL

## 2018-03-26 MED ORDER — FENTANYL CITRATE (PF) 100 MCG/2ML IJ SOLN
100.0000 ug | INTRAMUSCULAR | Status: DC | PRN
  Filled 2018-03-26: qty 2

## 2018-03-26 MED ORDER — DEXMEDETOMIDINE HCL IN NACL 400 MCG/100ML IV SOLN
0.0000 ug/kg/h | INTRAVENOUS | Status: DC
  Administered 2018-03-26: 0.4 ug/kg/h via INTRAVENOUS
  Administered 2018-03-27: 0.3 ug/kg/h via INTRAVENOUS
  Filled 2018-03-26 (×2): qty 100

## 2018-03-26 MED ORDER — ACETYLCYSTEINE 20 % IN SOLN
4.0000 mL | Freq: Four times a day (QID) | RESPIRATORY_TRACT | Status: AC
  Administered 2018-03-26 – 2018-03-28 (×8): 4 mL via RESPIRATORY_TRACT
  Filled 2018-03-26 (×9): qty 4

## 2018-03-26 MED ORDER — FENTANYL CITRATE (PF) 100 MCG/2ML IJ SOLN: 12.5000 ug | INTRAMUSCULAR | Status: DC | PRN

## 2018-03-26 MED ORDER — LACTATED RINGERS IV BOLUS
500.0000 mL | Freq: Once | INTRAVENOUS | Status: AC
  Administered 2018-03-26: 500 mL via INTRAVENOUS

## 2018-03-26 MED ORDER — MIDAZOLAM HCL 2 MG/2ML IJ SOLN: 2.0000 mg | INTRAMUSCULAR | Status: DC | PRN

## 2018-03-26 MED ORDER — MIDAZOLAM HCL 2 MG/2ML IJ SOLN
INTRAMUSCULAR | Status: AC
  Filled 2018-03-26: qty 2

## 2018-03-26 MED ORDER — POTASSIUM CHLORIDE 20 MEQ/15ML (10%) PO SOLN
40.0000 meq | Freq: Once | ORAL | Status: AC
  Administered 2018-03-26: 40 meq
  Filled 2018-03-26: qty 30

## 2018-03-26 MED ORDER — MIDAZOLAM HCL 2 MG/2ML IJ SOLN
2.0000 mg | Freq: Once | INTRAMUSCULAR | Status: AC
  Administered 2018-03-26: 2 mg via INTRAVENOUS

## 2018-03-26 MED ORDER — FENTANYL 25 MCG/HR TD PT72
25.0000 ug | MEDICATED_PATCH | TRANSDERMAL | Status: DC
  Administered 2018-03-26: 25 ug via TRANSDERMAL
  Filled 2018-03-26: qty 1

## 2018-03-26 MED ORDER — ETOMIDATE 2 MG/ML IV SOLN
10.0000 mg | Freq: Once | INTRAVENOUS | Status: AC
  Administered 2018-03-26: 10 mg via INTRAVENOUS

## 2018-03-26 MED ORDER — K PHOS MONO-SOD PHOS DI & MONO 155-852-130 MG PO TABS
500.0000 mg | ORAL_TABLET | Freq: Three times a day (TID) | ORAL | Status: DC
  Administered 2018-03-26 – 2018-03-27 (×3): 500 mg
  Filled 2018-03-26 (×6): qty 2

## 2018-03-26 MED ORDER — FENTANYL CITRATE (PF) 100 MCG/2ML IJ SOLN
INTRAMUSCULAR | Status: AC
  Filled 2018-03-26: qty 2

## 2018-03-26 MED ORDER — SODIUM CHLORIDE 3 % IN NEBU
4.0000 mL | INHALATION_SOLUTION | Freq: Two times a day (BID) | RESPIRATORY_TRACT | Status: AC
  Administered 2018-03-26 – 2018-03-28 (×6): 4 mL via RESPIRATORY_TRACT
  Filled 2018-03-26 (×7): qty 4

## 2018-03-26 MED ORDER — CHLORHEXIDINE GLUCONATE 0.12% ORAL RINSE (MEDLINE KIT)
15.0000 mL | Freq: Two times a day (BID) | OROMUCOSAL | Status: DC
  Administered 2018-03-26 – 2018-03-28 (×5): 15 mL via OROMUCOSAL

## 2018-03-26 MED ORDER — ROCURONIUM BROMIDE 50 MG/5ML IV SOLN
50.0000 mg | Freq: Once | INTRAVENOUS | Status: AC
  Administered 2018-03-26: 50 mg via INTRAVENOUS
  Filled 2018-03-26: qty 5

## 2018-03-26 MED ORDER — POTASSIUM PHOSPHATE MONOBASIC 500 MG PO TABS
500.0000 mg | ORAL_TABLET | Freq: Three times a day (TID) | ORAL | Status: DC
  Filled 2018-03-26: qty 1

## 2018-03-26 MED ORDER — FENTANYL 12 MCG/HR TD PT72: 50.0000 ug | MEDICATED_PATCH | TRANSDERMAL | Status: DC

## 2018-03-26 MED ORDER — FENTANYL CITRATE (PF) 100 MCG/2ML IJ SOLN: 100.0000 ug | INTRAMUSCULAR | Status: DC | PRN

## 2018-03-26 NOTE — Progress Notes (Signed)
Pt exhibiting increasing tachypnea and tachycardia in 120s-130. HFNC increased from 4L-6L with sats at 90%. Patel notified.

## 2018-03-26 NOTE — Progress Notes (Signed)
  Speech Language Pathology Treatment: Dysphagia  Patient Details Name: Kevin SimsDavid Whitney MRN: [id number] DOB: [birth year] Today's Date: 03/26/2018 Time: 1040-1056 SLP Time Calculation (min) (ACUTE ONLY): 16 min  Assessment / Plan / Recommendation Clinical Impression  Pt made minimal attempts at lingual manipulation today during oral care and administration of ice chips. No swallow was elicited despite Max cues, therefore oral suction was used to remove melted ice. He did cough to command, but this was significantly weak. Suspect he was more lethargic today as he had just finished his bath. Continue to recommend NPO.   HPI HPI: Pt is a 57 year old male with a lengthy past medical history including recent MRSA bacteremia related to a wound returns to our facility with acute respiratory failure with hypoxemia and severe sepsis from healthcare associated pneumonia on March 20, 2018.  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  Pt briefly intubated 11/8-9, under 48 hours.  Abnormal CXR 11/10      SLP Plan  Continue with current plan of care       Recommendations  Diet recommendations: NPO Medication Administration: Via alternative means                Oral Care Recommendations: Oral care QID Follow up Recommendations: (tba) SLP Visit Diagnosis: Dysphagia, oropharyngeal phase (R13.12) Plan: Continue with current plan of care       GO                [name], Keelon Zurn 03/26/2018, 11:05 AM  Maxcine HamLaura Paiewonsky, M.A. CCC-SLP Acute Herbalist[facility]itation Services Pager [phone] Office [phone]

## 2018-03-26 NOTE — Consult Note (Signed)
Consultation Note Date: 03/26/2018   Patient Name: Kevin Whitney  DOB: 1960/10/01  MRN: [id number]  Age / Sex: 57 y.o., male  PCP: [name], James, MD Referring Physician: Rolly SalterPatel, Pranav M, MD  Reason for Consultation: Establishing goals of care  HPI/Patient Profile: 57 y.o. 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  He developed pneumothorax requiring chest tubes.  He has been found to have mod to severe dysphagia and aspiration.  A cor trak has been placed for nutrition.   Clinical Assessment and Goals of Care:  I have reviewed medical records including EPIC notes, labs and imaging, received report from Dr. Allena KatzPatel, assessed the patient and then spoke on the phone with Mrs. Marshburn (patient's mother) to schedule an appointment to discuss diagnosis prognosis, GOC, EOL wishes, disposition and options.  I introduced Palliative Medicine as specialized medical care for people living with serious illness. It focuses on providing relief from the symptoms and stress of a serious illness. The goal is to improve quality of life for both the patient and the family.  We discussed a brief life review of the patient. 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  He never married but does have 1 son.  He and his son were estranged but reunited in July of 2019.  In 2003 he had back surgery.  Afterward he had a prolonged hospitalization (6 mos) for a staph infection. Since he has had recurrent infections.  He has lived at home with his parents.  After his AKA in August he discharged to SNF but then improved and was able to return home.  Prior to this admission his mother thought he was feeling poorly as he had run out of fentanyl patches.   When she refilled the medication and he was still doing very poorly she realized he needed to come to the hospital.  His parents agreed to come to the hospital 11/13 at 10:00 for a more in depth GOC conversation.  Primary Decision Maker:  NEXT OF KIN    SUMMARY OF RECOMMENDATIONS    GOC meeting 11/13 at 10:00.  Need to discussed goals for nutrition, as well as code status, and plans for the future.  Code Status/Advance Care Planning:  Partial.  He is willing to accept intubation.  Palliative Prophylaxis:   Turn Reposition  Prognosis:  Unable to determine at this point.    Discharge Planning: To Be Determined      Primary Diagnoses: Present on Admission: . HCAP (healthcare-associated pneumonia)   I have reviewed the medical record, interviewed the patient and family, and examined the patient. The following aspects are pertinent.  Past Medical History:  Diagnosis Date  . Acute blood loss anemia 12/2017  . Bacteremia   . Broken legs   . CHF (congestive heart failure) (HCC)   . Chronic pain   . Chronic  pain syndrome   . Complication of anesthesia    " i hallucinate "  . DDD (degenerative disc disease), lumbar   . 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 MRSA (methicillin resistant staph aureus) culture positive    2003   . Pressure injury of skin of sacral region   . Protein calorie malnutrition (HCC)   . Staph infection   . UTI (urinary tract infection) 12/2017   Social History   Socioeconomic History  . Marital status: Married    Spouse name: Not on file  . Number of children: Not on file  . Years of education: Not on file  . Highest education level: Not on file  Occupational History  . Not on file  Social Needs  . Financial resource strain: Not on file  . Food insecurity:    Worry: Not on file    Inability: Not on file  . Transportation needs:    Medical: Not on file    Non-medical: Not on file  Tobacco Use  .  Smoking status: Former Smoker    Packs/day: 0.75    Types: Cigarettes    Last attempt to quit: 01/06/2018    Years since quitting: 0.2  . Smokeless tobacco: Never Used  Substance and Sexual Activity  . Alcohol use: No    Alcohol/week: 0.0 standard drinks  . Drug use: No  . Sexual activity: Not on file  Lifestyle  . Physical activity:    Days per week: Not on file    Minutes per session: Not on file  . Stress: Not on file  Relationships  . Social connections:    Talks on phone: Not on file    Gets together: Not on file    Attends religious service: Not on file    Active member of club or organization: Not on file    Attends meetings of clubs or organizations: Not on file    Relationship status: Not on file  Other Topics Concern  . Not on file  Social History Narrative  . Not on file   Family History  Problem Relation Age of Onset  . Arthritis Father    Scheduled Meds: . aspirin  81 mg Per Tube Daily  . atorvastatin  40 mg Oral q1800  . docusate sodium  100 mg Oral BID  . heparin  5,000 Units Subcutaneous Q8H  . mouth rinse  15 mL Mouth Rinse BID  . multivitamin with minerals  1 tablet Oral Daily  . mupirocin ointment   Nasal BID  . phosphorus  500 mg Per Tube TID AC & HS  . polyethylene glycol  17 g Oral BID  . vitamin C  250 mg Per Tube BID   Continuous Infusions: . sodium chloride Stopped (03/24/18 1353)  . dexrose 5 % and 0.45 % NaCl with KCl 30 mEq/L 50 mL/hr at 03/25/18 2000  . famotidine (PEPCID) IV 20 mg (03/26/18 0910)  . feeding supplement (VITAL AF 1.2 CAL) 40 mL/hr at 03/25/18 2156  . piperacillin-tazobactam (ZOSYN)  IV 3.375 g (03/26/18 0248)   PRN Meds:.sodium chloride, acetaminophen, cyclobenzaprine, ondansetron (ZOFRAN) IV Allergies  Allergen Reactions  . Cephalexin Other (See Comments)  . Paroxetine Hcl Other (See Comments)    SEDATION   Review of Systems patient not speaking.  Physical Exam  Thin frail, lying in bed, receiving a bath.  Cor  trak in nose. Awake, alert, opens mouth to speak but is unable too.  CV  tachy but regular, systolic murmur resp increased work of breathing no frank w/c/r, chest tubes in place with significant drainage Abdomen thin, soft, nd Lower ext LLE AKA well healed stump.  RLE in prevlon boot.  Vital Signs: BP 118/86 (BP Location: Right Arm)   Pulse (!) 105   Temp 98.6 F (37 C) (Axillary)   Resp (!) 28   Ht 6\' 1"  (1.854 m)   Wt 65 kg   SpO2 93%   BMI 18.91 kg/m  Pain Scale: PAINAD   Pain Score: Asleep   SpO2: SpO2: 93 % O2 Device:SpO2: 93 % O2 Flow Rate: .O2 Flow Rate (L/min): 4 L/min  IO: Intake/output summary:   Intake/Output Summary (Last 24 hours) at 03/26/2018 1029 Last data filed at 03/26/2018 0600 Gross per 24 hour  Intake 782.67 ml  Output 1494 ml  Net -711.33 ml    LBM: Last BM Date: 03/25/18 Baseline Weight: Weight: 60.3 kg Most recent weight: Weight: 65 kg     Palliative Assessment/Data: 20%     Time In: 10:30 Time Out: 11:00 Time Total: 30 min. Greater than 50%  of this time was spent counseling and coordinating care related to the above assessment and plan.  Signed by: Norvel Richards, PA-C Palliative Medicine Pager: [phone]  Please contact Palliative Medicine Team phone at [phone] for questions and concerns.  For individual provider: See Loretha Stapler

## 2018-03-26 NOTE — Progress Notes (Addendum)
eLink Physician-Brief Progress Note Patient Name: Kevin SimsDavid Seltzer DOB: [birth year] MRN: [id number]   Date of Service  03/26/2018  HPI/Events of Note  Patient intubated and underwent bronchoscopy for left lung collapse.  Now hypotensive.  eICU Interventions  Hypotension likely from procedural sedation. Bolus 500 cc LR. Consider pressors if persistently hypotensive. Remove Fentanyl patch for now     Intervention Category Major Interventions: Hypotension - evaluation and management  [name] 03/26/2018, 9:07 PM

## 2018-03-26 NOTE — Procedures (Signed)
Intubation Procedure Note Kevin SimsDavid Whitney [id number] Mar 18, 1961  Procedure: Intubation Indications: Respiratory insufficiency  Procedure Details Consent: Risks of procedure as well as the alternatives and risks of each were explained to the (patient/caregiver).  Consent for procedure obtained. Time Out: Verified patient identification, verified procedure, site/side was marked, verified correct patient position, special equipment/implants available, medications/allergies/relevent history reviewed, required imaging and test results available.  Performed  Drugs Etomidate 20mg , Versed 2mg , Fentanyl 50mcg IV, Rocuronium 50mg  IV DL x 1 with Glidescope 3 blade Grade 1 view 8.0 ET tube passed through cords under direct visualization Placement confirmed with bilateral breath sounds, positive EtCO2 change and smoke in tube and later bronchoscopy   Evaluation Hemodynamic Status: BP stable throughout; O2 sats: stable throughout Patient's Current Condition: stable Complications: No apparent complications Patient did tolerate procedure well. Chest X-ray ordered to verify placement.  CXR: pending.   Kevin FickleDouglas Kevin Whitney 03/26/2018

## 2018-03-26 NOTE — Progress Notes (Signed)
NAME:  Kevin Whitney, MRN:  [id number], DOB:  Feb 05, 1961, LOS: 6 ADMISSION DATE:  03/20/2018, CONSULTATION DATE:  03/20/2018 REFERRING MD: Dr. 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 Plugged off  Consults: date of consult/date signed off & final recs:  11/6 > Wound ostomy consult  Procedures (surgical and bedside):  ETT 11/8 > 11/9 Rt chest tube x 2 11/10 >  Significant Diagnostic Tests:  11/6 blood cultures-negative  Micro Data:  11/6:  Blood >> No Growth  Antimicrobials:  11/6 vancomycin > 11/6 Zosyn > 11/6 ceftriaxone x1 11/6 azithromycin x1  INTERVAL HISTORY:  Chest tubes to water seal 11/11 Collapse L upper and lower lung 11/12 per CXR    Objective   Blood pressure 118/86, pulse (!) 105, temperature 98.6 F (37 C), temperature source Axillary, resp. rate (!) 28, height 6\' 1"  (1.854 m), weight 65 kg, SpO2 93 %.        Intake/Output Summary (Last 24 hours) at 03/26/2018 1130 Last data filed at 03/26/2018 0600 Gross per 24 hour  Intake 782.67 ml  Output 1389 ml    Net -606.33 ml   Filed Weights   03/21/18 0500 03/21/18 1634 03/24/18 0500  Weight: 60 kg 64 kg 65 kg   Examination:  General: Chronically ill appearing male, supine in bed,  answering simple questions,  RR is 28 on 5 L HENT: NCAT MM very dry, CorTrack intact and secure PULM: Right CT x 2 to water seal, R BS are clear, L side with diminished BS, rhonchi, very weak cough CV:S1, S2,  RRR, no mgr, ST per tele GI: BS+, soft, non tender, ND MSK: thin and frail appearing, Left AKA Neuro:Arouses to call of name , attempts to follow commands    Resolved Hospital Problem list     Assessment & Plan:  Right acute pneumothorax, spontaneous (related to positive pressure while on vent?) > chest tubes to water seal > CXR shows resolved pneumo on right > repeat CXR 11/13 > consider removing CT   Interval collapse of L upper and lower lobes last 24 hours per CXR 11/12 CXR 11/12>>Two chest tubes on the right without pneumothorax. Improved aeration right lung base, small right effusion Progressive collapse left lower lobe and left upper lobe since yesterday.  Very Weak cough Required increase in oxygen from 4-5 L Plan: Add mucomyst nebs Q 6 hours Add 3% saline nebs BID Add  chest vest/ Chest PT Minimize sedation Consider bronch if no improvement Palliative care for goals of care discussion scheduled for 11/13 at 10:00 am  Sepsis due to healthcare associated pneumonia now complicated by aspiration Very weak cough Recent MRSA infection, multiple wounds  Significant increase in WBC overnight 11/12 Afebrile Plan: Continue wound care Consider sputum culture Consider extending Zosyn beyond today as it is scheduled for dc 11/12 Check PCT to guide antimicrobial therapy  AKI: improving Hypokalemia: still low Plan Monitor BMET and UOP Replace electrolytes as needed Avoid nephrotoxic medications Start D5 1/2 NS with 30 mEq KCl  Narcotic dependence Chronic  pain/spasticity Plan: Fentanyl patch added back per [facility] for tachycardia( suspects WD) I have discontinued the prn dosing Monitor mental status and respiratory status  Peripheral vascular disease Continue statin  GERD Pepcid  Protein calorie malnutrition, severe NPO Cor trak placed 11/11: tube feedings   All other care per Primary Team  Goals of care Partial code, no chest compressions or shocks but intubation OK at present; palliative consulted. Family conference 11/12 at 10 am. Full DNR would be reasonable in light of worsening respiratory status and quality of life.  Disposition / Summary of Today's Plan 03/26/18   As above High risk for continued respiratory decompensation, aspiration with weak cough. Will move to SDU for closer monitoring Mucomyst, 3% saline nebs,Chest PT added CXR 11/13 am    Diet: tube feeding Pain/Anxiety/Delirium protocol (if indicated):n/a VAP protocol (if indicated): NA DVT prophylaxis: sub q hep GI prophylaxis: famotidine Hyperglycemia protocol: n/a Mobility: BR Code Status: Full Family Communication: No family at bedside 11/12   Bevelyn NgoSarah F. Groce, AGACNP-BC [facility]monary[facility]Critical [facility] Pager # [phone] After 3 pm Pager # [phone] 03/26/2018, 11:30 AM

## 2018-03-26 NOTE — Procedures (Signed)
PCCM Video Bronchoscopy Procedure Note  The patient was informed of the risks (including but not limited to bleeding, infection, respiratory failure, lung injury, tooth/oral injury) and benefits of the procedure and gave consent, see chart.  Indication: Mucus plugging of airway, left lung collapse  Post Procedure Diagnosis: same  [facility] [place]   Condition pre procedure: critically ill, on vent  Medications for procedure: sedation for procedure  Procedure description: The bronchoscope was introduced through the endotracheal tube and passed to the bilateral lungs to the level of the subsegmental bronchi throughout the tracheobronchial tree.  Airway exam revealed thick purulent bloody secretions throughout both lungs, worse in the left than in the right but both sides had significant mucus plugging.  There was bloody mucus plugging in the right lower lobe and right middle lobe, this was suctioned and sent for culture.  A large mucus plug was seen in the left mainstem extending into both the upper and lower lobes.  This was also cleared by suctioning.    Procedures performed: therapeutic aspiration of the airway  Specimens sent: bronchial aspirate for bacterial culture  Condition post procedure: critically ill, on vent  EBL: none  Complications: none  Heber CarolinaBrent Dontrez Pettis, MD [place] PCCM Pager: [phone] Cell: [phone] If no response, call [phone]

## 2018-03-26 NOTE — Care Management Important Message (Signed)
Important Message  Patient Details  Name: Kevin SimsDavid Moehle MRN: [id number] Date of Birth: [birth year]   Medicare Important Message Given:  Yes    Leone Havenaylor, Taelor Moncada [place], RN 03/26/2018, 3:23 PM

## 2018-03-26 NOTE — Progress Notes (Signed)
[facility] PROGRESS NOTE  Patient: Jory SimsDavid Aplin ZOX:[id number]   PCP: [name], James, MD DOB: 05/04/1961   DOA: 03/20/2018   DOS: 03/26/2018    Subjective: Patient was seen earlier in the morning.  Was breathing heavily.  Later in the afternoon heart rate increased.  Objective:  Vitals:   03/26/18 1840 03/26/18 1845  BP: 93/69 94/72  Pulse: (!) 115 (!) 114  Resp: (!) 21 18  Temp:    SpO2: 99% 100%    S1-S2 present. Bilateral crackles. Bowel sounds present. Pupils are reactive.  Assessment and plan: Acute on chronic respiratory with hypoxia failure. CCM was following up on the patient. Later in the afternoon due to his worsening mental breathing patient was transferred to ICU and emergently intubated. Underwent bronchoscopy. Management will be per CCM. Suspect there is a component of opioid withdrawal with his tachycardia. Appreciate CCM assistance.  Author: Lynden OxfordPranav Jessy Cybulski, MD [facility] Pager: [phone] 03/26/2018 7:00 PM   If 7PM-7AM, please contact night-coverage at [email or web address], password [email or web address]

## 2018-03-27 ENCOUNTER — Inpatient Hospital Stay (HOSPITAL_COMMUNITY): Payer: Medicare Other

## 2018-03-27 DIAGNOSIS — J9601 Acute respiratory failure with hypoxia: Secondary | ICD-10-CM

## 2018-03-27 DIAGNOSIS — E43 Unspecified severe protein-calorie malnutrition: Secondary | ICD-10-CM

## 2018-03-27 DIAGNOSIS — T17908D Unspecified foreign body in respiratory tract, part unspecified causing other injury, subsequent encounter: Secondary | ICD-10-CM

## 2018-03-27 LAB — BASIC METABOLIC PANEL
Anion gap: 3 — ABNORMAL LOW (ref 5–15)
BUN: 16 mg/dL (ref 6–20)
CHLORIDE: 119 mmol/L — AB (ref 98–111)
CO2: 25 mmol/L (ref 22–32)
CREATININE: 0.56 mg/dL — AB (ref 0.61–1.24)
Calcium: 6.8 mg/dL — ABNORMAL LOW (ref 8.9–10.3)
GFR calc Af Amer: >60 mL/min (ref >60)
GFR calc non Af Amer: >60 mL/min (ref >60)
Glucose, Bld: 121 mg/dL — ABNORMAL HIGH (ref 70–99)
Potassium: 3.2 mmol/L — ABNORMAL LOW (ref 3.5–5.1)
Sodium: 147 mmol/L — ABNORMAL HIGH (ref 135–145)

## 2018-03-27 LAB — GLUCOSE, CAPILLARY
GLUCOSE-CAPILLARY: 106 mg/dL — AB (ref 70–99)
GLUCOSE-CAPILLARY: 110 mg/dL — AB (ref 70–99)
GLUCOSE-CAPILLARY: 105 mg/dL — AB (ref 70–99)
Glucose-Capillary: 104 mg/dL — ABNORMAL HIGH (ref 70–99)
Glucose-Capillary: 115 mg/dL — ABNORMAL HIGH (ref 70–99)
Glucose-Capillary: 137 mg/dL — ABNORMAL HIGH (ref 70–99)
Glucose-Capillary: 88 mg/dL (ref 70–99)

## 2018-03-27 LAB — CBC
HCT: 25.8 % — ABNORMAL LOW (ref 39.0–52.0)
Hemoglobin: 8 g/dL — ABNORMAL LOW (ref 13.0–17.0)
MCH: 28.2 pg (ref 26.0–34.0)
MCHC: 31 g/dL (ref 30.0–36.0)
MCV: 90.8 fL (ref 80.0–100.0)
Platelets: 97 10*3/uL — ABNORMAL LOW (ref 150–400)
RBC: 2.84 MIL/uL — ABNORMAL LOW (ref 4.22–5.81)
RDW: 15.1 % (ref 11.5–15.5)
WBC: 15.7 10*3/uL — AB (ref 4.0–10.5)
nRBC: 0 % (ref 0.0–0.2)

## 2018-03-27 LAB — MAGNESIUM: Magnesium: 1.7 mg/dL (ref 1.7–2.4)

## 2018-03-27 LAB — PHOSPHORUS: Phosphorus: 1.2 mg/dL — ABNORMAL LOW (ref 2.5–4.6)

## 2018-03-27 LAB — PROCALCITONIN: Procalcitonin: <0.1 ng/mL

## 2018-03-27 MED ORDER — POLYETHYLENE GLYCOL 3350 17 G PO PACK
17.0000 g | PACK | Freq: Two times a day (BID) | ORAL | Status: DC
  Administered 2018-03-27 – 2018-03-28 (×2): 17 g
  Filled 2018-03-27 (×4): qty 1

## 2018-03-27 MED ORDER — ALBUMIN HUMAN 5 % IV SOLN
25.0000 g | Freq: Once | INTRAVENOUS | Status: AC
  Administered 2018-03-27: 25 g via INTRAVENOUS
  Filled 2018-03-27: qty 500

## 2018-03-27 MED ORDER — MIDODRINE HCL 5 MG PO TABS
5.0000 mg | ORAL_TABLET | Freq: Three times a day (TID) | ORAL | Status: DC
  Administered 2018-03-27 – 2018-03-29 (×6): 5 mg via ORAL
  Filled 2018-03-27 (×7): qty 1

## 2018-03-27 MED ORDER — DOCUSATE SODIUM 50 MG/5ML PO LIQD
100.0000 mg | Freq: Two times a day (BID) | ORAL | Status: DC
  Administered 2018-03-27: 100 mg via ORAL
  Filled 2018-03-27: qty 10

## 2018-03-27 MED ORDER — DOCUSATE SODIUM 50 MG/5ML PO LIQD: 100.0000 mg | Freq: Two times a day (BID) | ORAL | Status: DC

## 2018-03-27 MED ORDER — VANCOMYCIN HCL IN DEXTROSE 1-5 GM/200ML-% IV SOLN
1000.0000 mg | Freq: Two times a day (BID) | INTRAVENOUS | Status: DC
  Administered 2018-03-27 – 2018-03-28 (×4): 1000 mg via INTRAVENOUS
  Filled 2018-03-27 (×5): qty 200

## 2018-03-27 MED ORDER — POTASSIUM PHOSPHATES 15 MMOLE/5ML IV SOLN
30.0000 mmol | Freq: Once | INTRAVENOUS | Status: AC
  Administered 2018-03-27: 30 mmol via INTRAVENOUS
  Filled 2018-03-27: qty 10

## 2018-03-27 NOTE — Progress Notes (Signed)
   03/27/18 1442  Clinical Encounter Type  Visited With Patient  Visit Type Initial  Referral From Palliative care team  Consult/Referral To Chaplain  The chaplain responded to PMT request for spiritual care.  The chaplain checked in with RN before visiting the Pt.  The Pt. did not have family present at time of visit.  The Pt. was alert at time of chaplain visit.  The chaplain was spiritually present with the patient, recognizing the Pt. was unable to communicate with the chaplain.   The chaplain informed the Pt. a chaplain can be reached for another spiritual care visit through the RN. The Pt. nodded accepting prayer before the chaplain left the room.

## 2018-03-27 NOTE — Progress Notes (Signed)
Pharmacy Antibiotic Note - INITIAL Consult  Kevin Whitney is a 57 y.o. male admitted on 03/20/2018 with pneumonia Pharmacy has been consulted for vancomycin dosing. WBC 15.7 today, afebrile, underwent bronch yesterday with thick mucus plugging, BAL shows GPCs. Pt previously on vancomycin 1g q12h  Plan: Start vancomycin 1g q12h Follow cultures, renal function, troughs as indicated.  Height: _0  (185.4 cm) Weight: 183 lb (83 kg) IBW/kg (Calculated) : 79.9  Temp (24hrs), Avg:98.7 F (37.1 C), Min:97.8 F (36.6 C), Max:99.9 F (37.7 C)  Recent Labs  Lab 03/20/18 1315 03/20/18 1648 03/20/18 1657  03/22/18 1454 03/23/18 0358 03/24/18 0401 03/25/18 0244 03/25/18 2106 03/26/18 0353 03/26/18 1611 03/27/18 0700  WBC  --   --   --    < >  --  10.4 12.2* 14.9*  --  22.1*  --  15.7*  CREATININE  --  0.55*  --    < >  --  0.77 0.79 0.63 0.69 0.57* 0.52* 0.56*  LATICACIDVEN 2.03* 1.4 1.28  --   --   --   --   --   --   --   --   --   VANCOTROUGH  --   --   --   --  23*  --   --   --   --   --   --   --    < > = values in this interval not displayed.    Estimated Creatinine Clearance: 117.9 mL/min (A) (by C-G formula based on SCr of 0.56 mg/dL (L)).    Allergies  Allergen Reactions  . Cephalexin Other (See Comments)  . Paroxetine Hcl Other (See Comments)    SEDATION    Antimicrobials this admission: Vanc 11/6>>11/11; 11/13 >> Zosyn 11/6>>11/12 azithro x1 Ceftriaxone x 1  Dose adjustments this admission: 11/8 VT = 23 mcg/mL on 776m q8 >> 1g q12h  Microbiology results: 11/13 BAL: few GPCs BCx 11/6 - NGTD MRSA PCR +  Thank you for allowing pharmacy to be a part of this patient's care.  DJanae Bridgeman PharmD PGY1 Pharmacy Resident Phone: [phone]/13/2019 10:38 AM

## 2018-03-27 NOTE — Progress Notes (Signed)
Daily Progress Note   Patient Name: Kevin Whitney       Date: 03/27/2018 DOB: [birth year]  Age: 57 y.o. MRN#: [id number] Attending Physician: Juanito Doom, MD Primary Care Physician: [place] Pink, MD Admit Date: 03/20/2018  Reason for Consultation/Follow-up: Establishing goals of care and Psychosocial/spiritual support  Subjective: Examined the patient and met his parents at bedside.  We spoke at length about [name]'s history.  They described a young man who ran track, worked hard, was very generous and loved his family.  He fell in love and had a son.  His love died when their son was 28 years old and he lost the son to his significant other's parents.  [name] continued to live near his parents.  His parents have watched him suffer for the past 16 years with numerous health issues - recurrent infections and wounds.  They have watched his weight dwindle from over 300 lbs down to 130.  On the day his parents sent him to the hospital [name] had told them "I'm dying".  His home health RN offered the Hospital or Hospice.  [name] said he wanted to be at home.  His family made the decision to send him to the hospital.  [name]'s son, [name] (26), and his grand daughter, [name] (7) just came back into his life in July after the grandmother that raised [name] died.    While [name] understand how fragile Mclane is, and that he is close to death - they are very concerned about [name].  [name] has expressly stated "Do everything to keep him alive".  Dr. Lake Bells joined our conversation.  He described the condition of [name]'s lungs and the high likelihood that Divon would continue to develop pneumonia and recurrent mucous plugging.  He recommended that [name] visit his father and spend time with him.  Afterward we will move forward with a one way extubation.  Our plan will be to support Armanii if he does well after extubation, but to make him comfortable if he is suffering.    I have asked Jerrye Beavers (Mrs. Saladin) to let me know when [name] will be able to visit his father.   Assessment:   Patient requires ventilator support.  BP is soft.  His nutritional status is very poor.  He has multiple chronic wounds and significant  pain.  He is midodrine, opioid, nicotine and benzodiazepine dependent.  Patient Profile/HPI: 57 y.o. male  with past medical history of multiple back surgeries and recurrent staph infections, chronic wounds, chronic pain, severe PVD s/p LLE AKA, and severe malnutrition who was admitted on 03/20/2018 with severe sepsis and respiratory failure secondary to HCAP.  Swallow evaluation indicated that he has dysphagia with aspiration.  He developed right pneumo-thorax and required chest tubes  He had progressed out of the ICU but had recurrent respiratory failure secondary to mucous plugging.  He underwent emergent bronchoscopy and intubation and remains on life support.  His BP is currently soft despite IV fluids.    Length of Stay: 7  Current Medications: Scheduled Meds:  . acetylcysteine  4 mL Nebulization Q6H  . aspirin  81 mg Per Tube Daily  . atorvastatin  40 mg Oral q1800  . chlorhexidine gluconate (MEDLINE KIT)  15 mL Mouth Rinse BID  . docusate  100 mg Oral BID  . heparin  5,000 Units Subcutaneous Q8H  . mouth rinse  15 mL Mouth Rinse 10 times per day  . midodrine  5 mg Oral TID WC  . multivitamin with minerals  1 tablet Oral Daily  . mupirocin ointment   Nasal BID  . pantoprazole (PROTONIX) IV  40 mg Intravenous Daily  . polyethylene glycol  17 g Oral BID  . sodium chloride HYPERTONIC  4 mL Nebulization BID  . vitamin C  250 mg Per Tube BID    Continuous Infusions: . sodium chloride Stopped (03/24/18 1353)  . dexmedetomidine (PRECEDEX) IV infusion Stopped (03/27/18  0931)  . dexrose 5 % and 0.45 % NaCl with KCl 30 mEq/L 50 mL/hr at 03/27/18 1000  . famotidine (PEPCID) IV 100 mL/hr at 03/27/18 1000  . feeding supplement (VITAL AF 1.2 CAL) 1,000 mL (03/27/18 0455)  . potassium PHOSPHATE IVPB (in mmol)    . vancomycin      PRN Meds: sodium chloride, acetaminophen, albuterol, cyclobenzaprine, fentaNYL (SUBLIMAZE) injection, fentaNYL (SUBLIMAZE) injection, midazolam, midazolam, ondansetron (ZOFRAN) IV  Physical Exam        Chronically ill appearing, intubated.  Raises and eyebrow in respond to speaking to him. CV rrr resp no distress Abdomen thin Ext Left AKA appears well healed.  Vital Signs: BP (!) 92/58   Pulse 81   Temp 97.8 F (36.6 C) (Oral)   Resp 14   Ht _0  (1.854 m)   Wt 83 kg   SpO2 100%   BMI 24.14 kg/m  SpO2: SpO2: 100 % O2 Device: O2 Device: Ventilator O2 Flow Rate: O2 Flow Rate (L/min): 5 L/min  Intake/output summary:   Intake/Output Summary (Last 24 hours) at 03/27/2018 1056 Last data filed at 03/27/2018 1000 Gross per 24 hour  Intake 4617.43 ml  Output 456 ml  Net 4161.43 ml   LBM: Last BM Date: 03/25/18 Baseline Weight: Weight: 60.3 kg Most recent weight: Weight: 83 kg       Palliative Assessment/Data: 20%      Patient Active Problem List   Diagnosis Date Noted  . Palliative care encounter   . Pneumothorax   . Pneumothorax on right   . Pressure injury of skin 03/22/2018  . Acute respiratory failure ([place])   . Aspiration into airway   . Septic shock ([place])   . Shortness of breath   . HCAP (healthcare-associated pneumonia) 03/20/2018  . Bacteremia   . Pressure injury of skin of sacral region   . DDD (degenerative disc  disease), lumbosacral   . Chronic pain syndrome   . Leukocytosis   . Acute blood loss anemia   . Left buttock abscess 01/05/2018  . Stage III pressure ulcer of buttock ([place]) 01/05/2018  . MRSA bacteremia 01/05/2018  . Atherosclerosis of native artery of left lower extremity with  gangrene ([place]) 01/05/2018  . Antibiotic-associated diarrhea 01/05/2018  . Proteus infection 01/05/2018  . Acute lower UTI 01/05/2018  . Pressure ulcer of left upper back, stage 3 ([place]) 01/05/2018  . Hypokalemia 01/05/2018  . H/O degenerative disc disease 01/05/2018  . Chronic narcotic dependence ([place]) 01/05/2018  . Disuse atrophy of muscle 01/05/2018  . Tobacco abuse counseling 01/05/2018  . Right middle lobe pulmonary nodule 01/05/2018  . Chronic diastolic CHF (congestive heart failure) ([place]) 01/05/2018  . Non-healing ulcer of groin with fat layer exposed ([place]) 01/05/2018  . Gangrene of left foot ([place]) 01/05/2018  . Severe protein-calorie malnutrition ([place])   . DDD (degenerative disc disease), lumbar 11/02/2015  . Facet syndrome, lumbar 11/02/2015  . Sacroiliac joint dysfunction 11/02/2015    Palliative Care Plan    Recommendations/Plan:  We have requested that his son [name] visit.  Family understands and agrees with the plan to move forward with 1 way extubation after son has an opportunity to visit.  PMT will continue to follow with you.   Code Status:  DNR  Prognosis:  Very poor prognosis.  Likely hours to days once extubated.  Discharge Planning:  Anticipated Hospital Death   Care plan was discussed with Attending physician, family.  Thank you for allowing the Palliative Medicine Team to assist in the care of this patient.  Total time spent:  70 min     Greater than 50%  of this time was spent counseling and coordinating care related to the above assessment and plan.  Florentina Jenny, PA-C Palliative Medicine  Please contact Palliative MedicineTeam phone at [phone] for questions and concerns between 7 am - 7 pm.   Please see AMION for individual provider pager numbers.

## 2018-03-27 NOTE — Progress Notes (Signed)
eLink Physician-Brief Progress Note Patient Name: Kevin SimsDavid Whitney DOB: 1961/05/04 MRN: [id number]   Date of Service  03/27/2018  HPI/Events of Note  BP improved with fluids still at 81/63.  Last albumin level 1.5.  eICU Interventions  Ordered albumin 25 gm x 1      Intervention Category Major Interventions: Hypotension - evaluation and management  Darl Pikesmily T Algie Cales 03/27/2018, 12:27 AM

## 2018-03-27 NOTE — Progress Notes (Signed)
NAME:  Kevin Whitney, MRN:  [id number], DOB:  [birth year], LOS: 7 ADMISSION DATE:  03/20/2018, CONSULTATION DATE:  03/20/2018 REFERRING MD: Dr. 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 Plugged off, intubated suctioning via bronchoscopy  Consults: date of consult/date signed off & final recs:  11/6 > Wound ostomy consult  Procedures (surgical and bedside):  ETT 11/8 > 11/9 Rt chest tube x 2 11/10 > ETT 11/12 >   Significant Diagnostic Tests:  11/6 blood cultures-negative  Micro Data:  11/6:  Blood >> No Growth 11/12 resp >>GPC  Antimicrobials:  11/6 vancomycin > 11/6 Zosyn > 11/6 ceftriaxone x1 11/6 azithromycin x1 11/12 vanc >   INTERVAL HISTORY:  Re-intubated for mucus plugging yesterday Remains on vent hypotensive    Objective   Blood pressure (!) 84/54, pulse 83, temperature 97.8 F (36.6 C), temperature source Oral, resp. rate (!) 22, height _0  (1.854 m), weight 83 kg, SpO2 100 %.    Vent Mode: PRVC FiO2 (%):  [60 %-100 %] 60 % Set Rate:  [20 bmp] 20 bmp Vt  Set:  [550 mL] 550 mL PEEP:  [10 cmH20] 10 cmH20 Plateau Pressure:  [21 cmH20-30 cmH20] 30 cmH20   Intake/Output Summary (Last 24 hours) at 03/27/2018 1008 Last data filed at 03/27/2018 0900 Gross per 24 hour  Intake 4297.97 ml  Output 436 ml  Net 3861.97 ml   Filed Weights   03/21/18 1634 03/24/18 0500 03/27/18 0255  Weight: 64 kg 65 kg 83 kg   Examination:  General:  Chronically ill appearing in bed on vent HENT: NCAT ETT in place PULM: CTA B, vent supported breathing CV: RRR, no mgr GI: BS+, soft, nontender MSK: normal bulk and tone Neuro: sedated on vent     Resolved Hospital Problem list     Assessment & Plan:  Right acute pneumothorax, spontaneous (related to positive pressure while on vent?) > continue chest tubes to water seal > daily CXR  Acute respiratory failure with hypoxemia due to mucus plugging of left lung; suspect MRSA tracheitis > start vanc > continue full vent support until son gets her (see discussion below) > VAP prevention > continue mucomyst, hypertonic saline, pulm toilette > f/u resp cultures  Hypotension: sedation related > minimize sedation > start midodrine  Narcotic dependence Chronic pain/spasticity Plan: > Stop fentanyl patch, continue PRN fentanyl  Peripheral vascular disease > continue statin  GERD Pepcid  Protein calorie malnutrition, severe Continue tube feedings   Goals of care Met with family today, code status is DNR.  Plan to have son visit him the proceed with one way extubation.  If he does well then continue full support,  however if his condition declines then no further ICU level care, no reintubation.   Disposition / Summary of Today's Plan 03/27/18   Read above    Diet: tube feeding Pain/Anxiety/Delirium protocol (if indicated): PAD protocol yes VAP protocol (if indicated): yes DVT prophylaxis: sub q hep GI prophylaxis: famotidine Hyperglycemia protocol: n/a Mobility: BR Code Status:  Full Family Communication: see above  My cc time 40 minutes   Roselie Awkward, MD [facility] Pager: [phone] Cell: [phone] If no response, call [phone]  03/27/2018, 10:08 AM

## 2018-03-27 NOTE — Progress Notes (Signed)
SLP Cancellation Note  Patient Details Name: Kevin SimsDavid Charlie MRN: [id number] DOB: Jan 19, 1961   Cancelled treatment:       Reason Eval/Treat Not Completed: Medical issues which prohibited therapy - pt reintubated. SLP to sign off for now - please reorder when ready.   [name], Neera Teng 03/27/2018, 10:10 AM  Maxcine HamLaura Paiewonsky, M.A. CCC-SLP [facility] Pager [phone] Office [phone]

## 2018-03-27 NOTE — Progress Notes (Signed)
Nutrition Follow-up  DOCUMENTATION CODES:   Severe malnutrition in context of chronic illness  INTERVENTION:   Continue tube feeding via Cortrak tube:  Vital AF 1.2 at 65 ml/h (1560 ml per day)  Provides 1872 kcal, 117 gm protein, 1265 ml free water daily  Continue to monitor magnesium, potassium, and phosphorus, MD to replete as needed, as pt is at risk for refeeding syndrome given severe PCM.   NUTRITION DIAGNOSIS:   Severe Malnutrition related to chronic illness as evidenced by energy intake < 75% for > or equal to 1 month, severe fat depletion, severe muscle depletion.  Ongoing  GOAL:   Patient will meet greater than or equal to 90% of their needs  Met with TF  MONITOR:   Vent status, Labs, Weight trends, Skin  ASSESSMENT:   57 yo male admitted with acute respiratory failure and severe sepsis related to HCAP, AKI. Pt initially on HFNC but decompensated on 11/8 requiring intubation. Pt with recent admission for MRSA bacteremia related to wounds. PMH includes spinal stenosis, L AKA, multiple MRSA infections, wheelchair bound, PVD, narcotic dependence  Patient required re-intubation 11/12. Palliative Care team following. Plans for meeting with family this morning.  Cortrak was placed 11/11, tip is in the stomach. Receiving Vital AF 1.2 at 65 ml/h providing 1872 kcal, 117 gm protein, 1264 ml free water daily. Tolerating TF well. Low potassium and phosphorous likely related to refeeding syndrome; receiving IV repletion.  Patient is currently intubated on ventilator support MV: 10.9 L/min Temp (24hrs), Avg:98.7 F (37.1 C), Min:97.8 F (36.6 C), Max:99.9 F (37.7 C)   Labs reviewed. Sodium 147 (H), potassium 3.2 (L), phosphorus 1.2 (L) Medications reviewed and include Colace, MVI, Miralax, vitamin C, potassium phosphate, KCl in IVF.  I/O +11.7 L since admission Weight up 22.7 kg since admission  Diet Order:   Diet Order            Diet NPO time specified  Diet  effective now              EDUCATION NEEDS:   Not appropriate for education at this time  Skin:  Skin Assessment: Skin Integrity Issues: Skin Integrity Issues:: Stage II, Stage III, DTI DTI: thoracic spine, buttock, right lateral rib cage x 4 Stage II: left upper buttock/sacrum Stage III: left scapula  Last BM:  11/11  Height:   Ht Readings from Last 1 Encounters:  03/20/18 6' 1"  (1.854 m)    Weight:   Wt Readings from Last 1 Encounters:  03/27/18 83 kg   Admission weight 60.3 kg  Ideal Body Weight:  83.6 kg  BMI:  Body mass index is 24.14 kg/m.  Estimated Nutritional Needs:   Kcal:  1860  Protein:  100-120 gm  Fluid:  >/= 1.8 L    Molli Barrows, RD, LDN, [place] Pager [phone] After Hours Pager [phone]

## 2018-03-28 LAB — CBC
HEMATOCRIT: 31.5 % — AB (ref 39.0–52.0)
HEMOGLOBIN: 9.7 g/dL — AB (ref 13.0–17.0)
MCH: 27.6 pg (ref 26.0–34.0)
MCHC: 30.8 g/dL (ref 30.0–36.0)
MCV: 89.7 fL (ref 80.0–100.0)
NRBC: 0.1 % (ref 0.0–0.2)
Platelets: 198 10*3/uL (ref 150–400)
RBC: 3.51 MIL/uL — ABNORMAL LOW (ref 4.22–5.81)
RDW: 15.4 % (ref 11.5–15.5)
WBC: 22.1 10*3/uL — ABNORMAL HIGH (ref 4.0–10.5)

## 2018-03-28 LAB — GLUCOSE, CAPILLARY
GLUCOSE-CAPILLARY: 132 mg/dL — AB (ref 70–99)
GLUCOSE-CAPILLARY: 94 mg/dL (ref 70–99)
Glucose-Capillary: 94 mg/dL (ref 70–99)
Glucose-Capillary: 96 mg/dL (ref 70–99)
Glucose-Capillary: 90 mg/dL (ref 70–99)

## 2018-03-28 LAB — BASIC METABOLIC PANEL
ANION GAP: 8 (ref 5–15)
BUN: 11 mg/dL (ref 6–20)
CO2: 24 mmol/L (ref 22–32)
CREATININE: 0.67 mg/dL (ref 0.61–1.24)
Calcium: 7.3 mg/dL — ABNORMAL LOW (ref 8.9–10.3)
Chloride: 112 mmol/L — ABNORMAL HIGH (ref 98–111)
GFR calc non Af Amer: >60 mL/min (ref >60)
GLUCOSE: 103 mg/dL — AB (ref 70–99)
Potassium: 2.6 mmol/L — CL (ref 3.5–5.1)
SODIUM: 144 mmol/L (ref 135–145)

## 2018-03-28 LAB — MAGNESIUM: MAGNESIUM: 1.7 mg/dL (ref 1.7–2.4)

## 2018-03-28 LAB — PHOSPHORUS: PHOSPHORUS: 2 mg/dL — AB (ref 2.5–4.6)

## 2018-03-28 MED ORDER — POTASSIUM CHLORIDE 20 MEQ/15ML (10%) PO SOLN
40.0000 meq | Freq: Two times a day (BID) | ORAL | Status: DC
  Administered 2018-03-28: 40 meq via ORAL
  Filled 2018-03-28: qty 30

## 2018-03-28 MED ORDER — SODIUM PHOSPHATES 45 MMOLE/15ML IV SOLN
20.0000 mmol | Freq: Once | INTRAVENOUS | Status: AC
  Administered 2018-03-28: 20 mmol via INTRAVENOUS
  Filled 2018-03-28: qty 6.67

## 2018-03-28 MED ORDER — POTASSIUM CHLORIDE 20 MEQ/15ML (10%) PO SOLN
40.0000 meq | Freq: Three times a day (TID) | ORAL | Status: DC
  Administered 2018-03-28 (×2): 40 meq via ORAL
  Filled 2018-03-28 (×2): qty 30

## 2018-03-28 MED ORDER — SODIUM PHOSPHATES 45 MMOLE/15ML IV SOLN
20.0000 mmol | Freq: Once | INTRAVENOUS | Status: DC
  Filled 2018-03-28: qty 6.67

## 2018-03-28 MED ORDER — FENTANYL CITRATE (PF) 100 MCG/2ML IJ SOLN
50.0000 ug | INTRAMUSCULAR | Status: DC | PRN
  Administered 2018-03-28 – 2018-03-29 (×3): 50 ug via INTRAVENOUS
  Filled 2018-03-28 (×3): qty 2

## 2018-03-28 NOTE — Progress Notes (Signed)
Daily Progress Note   Patient Name: Kevin Whitney       Date: 03/28/2018 DOB: 1960-05-24  Age: 57 y.o. MRN#: [id number] Attending Physician: Juanito Doom, MD Primary Care Physician: [place] Pink, MD Admit Date: 03/20/2018  Reason for Consultation/Follow-up: Establishing goals of care and Psychosocial/spiritual support  Subjective:  Patient nods his head that he is uncomfortable and in pain.  Parents at bedside ask for him to have pain medication.  I asked about the patient's son coming to visit him.  The patient's father replied - we are about to call [name].  We will let you know.  I spoke with Dr. Chrystine Oiler last evening.  He knows Laquan well and expressed that he has come thru extremely difficult health issues in the past.     Assessment: Patient intubated but more alert today.  Able to respond with a nod of the head to just a couple of questions.  Fatigued after a weaning trial.  BP appears stable.   Patient Profile/HPI:  57 y.o. male  with past medical history of multiple back surgeries and recurrent staph infections, chronic wounds, chronic pain, severe PVD s/p LLE AKA, and severe malnutrition who was admitted on 03/20/2018 with severe sepsis and respiratory failure secondary to HCAP.  Swallow evaluation indicated that he has dysphagia with aspiration.  He developed right pneumo-thorax and required chest tubes  He had progressed out of the ICU but had recurrent respiratory failure secondary to mucous plugging.  He underwent emergent bronchoscopy and intubation and remains on life support.   Length of Stay: 8  Current Medications: Scheduled Meds:  . aspirin  81 mg Per Tube Daily  . atorvastatin  40 mg Oral q1800  . chlorhexidine gluconate (MEDLINE KIT)  15 mL Mouth Rinse BID    . docusate  100 mg Per Tube BID  . heparin  5,000 Units Subcutaneous Q8H  . mouth rinse  15 mL Mouth Rinse 10 times per day  . midodrine  5 mg Oral TID WC  . multivitamin with minerals  1 tablet Oral Daily  . mupirocin ointment   Nasal BID  . pantoprazole (PROTONIX) IV  40 mg Intravenous Daily  . polyethylene glycol  17 g Per Tube BID  . potassium chloride  40 mEq Oral TID  . sodium chloride HYPERTONIC  4  mL Nebulization BID  . vitamin C  250 mg Per Tube BID    Continuous Infusions: . sodium chloride 10 mL/hr at 03/28/18 0900  . dexmedetomidine (PRECEDEX) IV infusion Stopped (03/27/18 0931)  . dexrose 5 % and 0.45 % NaCl with KCl 30 mEq/L 50 mL/hr at 03/28/18 0900  . feeding supplement (VITAL AF 1.2 CAL) 1,000 mL (03/28/18 6160)  . sodium phosphate  Dextrose 5% IVPB    . vancomycin 1,000 mg (03/28/18 1044)    PRN Meds: sodium chloride, acetaminophen, albuterol, cyclobenzaprine, fentaNYL (SUBLIMAZE) injection, fentaNYL (SUBLIMAZE) injection, midazolam, midazolam, ondansetron (ZOFRAN) IV  Physical Exam        Chronically ill appearing male, awake intubated. CV tachycardic Resp no current distress   Vital Signs: BP 125/75   Pulse (!) 108   Temp 98.8 F (37.1 C) (Oral)   Resp (!) 27   Ht _0  (1.854 m)   Wt 85.3 kg   SpO2 99%   BMI 24.80 kg/m  SpO2: SpO2: 99 % O2 Device: O2 Device: Ventilator O2 Flow Rate: O2 Flow Rate (L/min): 5 L/min  Intake/output summary:   Intake/Output Summary (Last 24 hours) at 03/28/2018 1135 Last data filed at 03/28/2018 1044 Gross per 24 hour  Intake 3814.55 ml  Output 1370 ml  Net 2444.55 ml   LBM: Last BM Date: 03/28/18 Baseline Weight: Weight: 60.3 kg Most recent weight: Weight: 85.3 kg       Palliative Assessment/Data:  20%      Patient Active Problem List   Diagnosis Date Noted  . Acute respiratory failure with hypoxemia ([place])   . Palliative care encounter   . Pneumothorax   . Pneumothorax on right   . Pressure  injury of skin 03/22/2018  . Acute respiratory failure ([place])   . Aspiration into airway   . Septic shock ([place])   . Shortness of breath   . HCAP (healthcare-associated pneumonia) 03/20/2018  . Bacteremia   . Pressure injury of skin of sacral region   . DDD (degenerative disc disease), lumbosacral   . Chronic pain syndrome   . Leukocytosis   . Acute blood loss anemia   . Left buttock abscess 01/05/2018  . Stage III pressure ulcer of buttock ([place]) 01/05/2018  . MRSA bacteremia 01/05/2018  . Atherosclerosis of native artery of left lower extremity with gangrene ([place]) 01/05/2018  . Antibiotic-associated diarrhea 01/05/2018  . Proteus infection 01/05/2018  . Acute lower UTI 01/05/2018  . Pressure ulcer of left upper back, stage 3 ([place]) 01/05/2018  . Hypokalemia 01/05/2018  . H/O degenerative disc disease 01/05/2018  . Chronic narcotic dependence ([place]) 01/05/2018  . Disuse atrophy of muscle 01/05/2018  . Tobacco abuse counseling 01/05/2018  . Right middle lobe pulmonary nodule 01/05/2018  . Chronic diastolic CHF (congestive heart failure) ([place]) 01/05/2018  . Non-healing ulcer of groin with fat layer exposed ([place]) 01/05/2018  . Gangrene of left foot ([place]) 01/05/2018  . Severe protein-calorie malnutrition ([place])   . DDD (degenerative disc disease), lumbar 11/02/2015  . Facet syndrome, lumbar 11/02/2015  . Sacroiliac joint dysfunction 11/02/2015    Palliative Care Plan    Recommendations/Plan:  Current plan is for son to be allowed to visit with his Dad.  Afterward we can move forward with a one way extubation with the hopes that Quandarius will be successful and survive coming off the vent.  If he comes off the vent successfully he will be an excellent candidate for Hospice  Family is concerned with Lott's comfort.  If CCM feels it is appropriate please consider replacing fentanyl patch for some base line pain control.   Code Status:  DNR  Prognosis:   poor long term prognosis.   He is highly likely to aspirate or plug and suffer recurrent respiratory failure when he is extubated.   Discharge Planning:  To Be Determined  Care plan was discussed with parents  Thank you for allowing the Palliative Medicine Team to assist in the care of this patient.  Total time spent:  15 min     Greater than 50%  of this time was spent counseling and coordinating care related to the above assessment and plan.  Florentina Jenny, PA-C Palliative Medicine  Please contact Palliative MedicineTeam phone at [phone] for questions and concerns between 7 am - 7 pm.   Please see AMION for individual provider pager numbers.

## 2018-03-28 NOTE — Progress Notes (Addendum)
NAME:  Kevin Whitney, MRN:  [id number], DOB:  February 21, 1961, LOS: 8 ADMISSION DATE:  03/20/2018, CONSULTATION DATE:  03/20/2018 REFERRING MD: Dr. 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 Plugged off, intubated suctioning via bronchoscopy 11/13>> Paliative GoC, one way extubation when son arrives  Consults: date of consult/date signed off & final recs:  11/6 > Wound ostomy consult  Procedures (surgical and bedside):  ETT 11/8 > 11/9 Rt chest tube x 2 11/10 > ETT 11/12 >   Significant Diagnostic Tests:  11/6 blood cultures-negative  Micro Data:  11/6:  Blood >> No Growth 11/12 resp >>GPC  Antimicrobials:  11/6 vancomycin >11/1 11/6 Zosyn > 11/12 11/6 ceftriaxone x1 11/6 azithromycin x1 11/13 vanc >   INTERVAL HISTORY:  Palliative GoC discussion on 11/13, one way extubation once son sees No acute events overnight  Objective   Blood pressure 113/68, pulse (!) 104, temperature 98.8 F (37.1 C), temperature source Oral, resp. rate (!) 22, height 6\' 1"  (1.854 m), weight 85.3 kg,  SpO2 100 %.    Vent Mode: CPAP;PSV FiO2 (%):  [30 %-50 %] 30 % Set Rate:  [20 bmp] 20 bmp Vt Set:  [550 mL] 550 mL PEEP:  [5 cmH20] 5 cmH20 Pressure Support:  [5 cmH20] 5 cmH20 Plateau Pressure:  [12 cmH20-21 cmH20] 12 cmH20   Intake/Output Summary (Last 24 hours) at 03/28/2018 0911 Last data filed at 03/28/2018 0900 Gross per 24 hour  Intake 3882.95 ml  Output 1325 ml  Net 2557.95 ml   Filed Weights   03/24/18 0500 03/27/18 0255 03/28/18 0500  Weight: 65 kg 83 kg 85.3 kg   Examination:  General: chronically ill appearing 57 year old male, on vent, no distress HENT: NCAT ETT in place PULM: CTAB, ETT as above, no accessory muscle use CV: rrr, no m/r/g, 2+ pt/dp bilaterally GI: BS+, soft, nontender MSK: normal bulk and tone Neuro: awake, able to follow commands on vent, GCS 11T  Resolved Hospital Problem list   hypomagnesemia  Assessment & Plan:  Acute respiratory failure with hypoxemia due to mucus plugging of left lung; suspect MRSA tracheitis > continue vanc (11/13- ) > continue full vent support until son gets her (see discussion below) > VAP prevention > continue mucomyst, hypertonic saline, pulm toilette > f/u resp cultures  Hypokalemia k 2.7 this am. S/P repletion on 11/13. Will give kcl liquid tid. Will add on phosphate and mg level to am labs. Replete as needed.  Right acute pneumothorax, spontaneous (related to positive pressure while on vent?) > continue chest tubes to water seal > daily CXR while intubated  Hypotension: sedation related > minimize sedation > continue midodrine  Narcotic dependence Chronic pain/spasticity continue PRN fentanyl  Peripheral vascular disease > continue statin  GERD protonix  Protein calorie malnutrition, severe Continue tube feedings  Goals of care Son to see patient and then proceed with one way extubation. No further escalation of care after extubation   Disposition / Summary of Today's Plan 03/28/18    See above goc    Diet: tube feeding Pain/Anxiety/Delirium protocol (if indicated): PAD protocol yes VAP protocol (if indicated): yes DVT prophylaxis: sub q hep GI prophylaxis: famotidine Hyperglycemia protocol: n/a Mobility: BR Code Status: Full Family Communication: see above  Myrene BuddyJacob Dawanna Grauberger MD PGY-2 Family Medicine Resident

## 2018-03-28 NOTE — Procedures (Signed)
Extubation Procedure Note  Patient Details:   Name: Kevin SimsDavid Whitney DOB: 07/11/1960 MRN: [id number]   Airway Documentation:    Vent end date: 03/28/18 Vent end time: 1653   Evaluation  O2 sats: stable throughout Complications: No apparent complications Patient did tolerate procedure well. Bilateral Breath Sounds: Diminished   Yes  Peggye FormSteven  Julia Kulzer 03/28/2018, 4:54 PM

## 2018-03-29 ENCOUNTER — Inpatient Hospital Stay (HOSPITAL_COMMUNITY): Payer: Medicare Other

## 2018-03-29 LAB — GLUCOSE, CAPILLARY
GLUCOSE-CAPILLARY: 93 mg/dL (ref 70–99)
GLUCOSE-CAPILLARY: 118 mg/dL — AB (ref 70–99)
Glucose-Capillary: 96 mg/dL (ref 70–99)

## 2018-03-29 LAB — COMPREHENSIVE METABOLIC PANEL
ALK PHOS: 48 U/L (ref 38–126)
ALT: 40 U/L (ref 0–44)
AST: 31 U/L (ref 15–41)
Albumin: 1.5 g/dL — ABNORMAL LOW (ref 3.5–5.0)
Anion gap: 5 (ref 5–15)
BILIRUBIN TOTAL: 0.8 mg/dL (ref 0.3–1.2)
BUN: 9 mg/dL (ref 6–20)
CALCIUM: 7 mg/dL — AB (ref 8.9–10.3)
CO2: 23 mmol/L (ref 22–32)
Chloride: 113 mmol/L — ABNORMAL HIGH (ref 98–111)
Creatinine, Ser: 0.52 mg/dL — ABNORMAL LOW (ref 0.61–1.24)
GFR calc Af Amer: >60 mL/min (ref >60)
GFR calc non Af Amer: >60 mL/min (ref >60)
Glucose, Bld: 206 mg/dL — ABNORMAL HIGH (ref 70–99)
POTASSIUM: 4 mmol/L (ref 3.5–5.1)
Sodium: 141 mmol/L (ref 135–145)
TOTAL PROTEIN: 4.9 g/dL — AB (ref 6.5–8.1)

## 2018-03-29 LAB — CBC WITH DIFFERENTIAL/PLATELET
Abs Immature Granulocytes: 0.17 10*3/uL — ABNORMAL HIGH (ref 0.00–0.07)
Basophils Absolute: 0 10*3/uL (ref 0.0–0.1)
Basophils Relative: 0 %
EOS ABS: 0.7 10*3/uL — AB (ref 0.0–0.5)
Eosinophils Relative: 4 %
HEMATOCRIT: 27.9 % — AB (ref 39.0–52.0)
HEMOGLOBIN: 8.4 g/dL — AB (ref 13.0–17.0)
IMMATURE GRANULOCYTES: 1 %
LYMPHS ABS: 1 10*3/uL (ref 0.7–4.0)
Lymphocytes Relative: 5 %
MCH: 26.7 pg (ref 26.0–34.0)
MCHC: 30.1 g/dL (ref 30.0–36.0)
MCV: 88.6 fL (ref 80.0–100.0)
MONOS PCT: 4 %
Monocytes Absolute: 0.8 10*3/uL (ref 0.1–1.0)
NEUTROS PCT: 86 %
NRBC: 0 % (ref 0.0–0.2)
Neutro Abs: 16.7 10*3/uL — ABNORMAL HIGH (ref 1.7–7.7)
Platelets: 252 10*3/uL (ref 150–400)
RBC: 3.15 MIL/uL — ABNORMAL LOW (ref 4.22–5.81)
RDW: 15.5 % (ref 11.5–15.5)
WBC: 19.2 10*3/uL — AB (ref 4.0–10.5)

## 2018-03-29 LAB — CULTURE, RESPIRATORY W GRAM STAIN

## 2018-03-29 LAB — CULTURE, RESPIRATORY

## 2018-03-29 LAB — PHOSPHORUS: PHOSPHORUS: 1.5 mg/dL — AB (ref 2.5–4.6)

## 2018-03-29 MED ORDER — LORAZEPAM 1 MG PO TABS: 1.0000 mg | ORAL_TABLET | ORAL | Status: DC | PRN

## 2018-03-29 MED ORDER — LORAZEPAM 2 MG/ML IJ SOLN
1.0000 mg | INTRAMUSCULAR | Status: DC | PRN
  Administered 2018-03-29: 1 mg via INTRAVENOUS
  Filled 2018-03-29: qty 1

## 2018-03-29 MED ORDER — GLYCOPYRROLATE 1 MG PO TABS
1.0000 mg | ORAL_TABLET | ORAL | Status: DC | PRN
  Filled 2018-03-29: qty 1

## 2018-03-29 MED ORDER — FENTANYL 75 MCG/HR TD PT72
75.0000 ug | MEDICATED_PATCH | TRANSDERMAL | Status: DC
  Administered 2018-03-29 – 2018-04-01 (×2): 75 ug via TRANSDERMAL
  Filled 2018-03-29 (×2): qty 1

## 2018-03-29 MED ORDER — BIOTENE DRY MOUTH MT LIQD: 15.0000 mL | OROMUCOSAL | Status: DC | PRN

## 2018-03-29 MED ORDER — MORPHINE SULFATE (CONCENTRATE) 10 MG/0.5ML PO SOLN: 5.0000 mg | ORAL | Status: DC | PRN

## 2018-03-29 MED ORDER — HALOPERIDOL LACTATE 2 MG/ML PO CONC
0.5000 mg | ORAL | Status: DC | PRN
  Filled 2018-03-29: qty 0.3

## 2018-03-29 MED ORDER — GLYCOPYRROLATE 0.2 MG/ML IJ SOLN
0.2000 mg | INTRAMUSCULAR | Status: DC | PRN
  Administered 2018-03-30 – 2018-03-31 (×3): 0.2 mg via INTRAVENOUS
  Filled 2018-03-29 (×3): qty 1

## 2018-03-29 MED ORDER — POLYVINYL ALCOHOL 1.4 % OP SOLN
1.0000 [drp] | Freq: Four times a day (QID) | OPHTHALMIC | Status: DC | PRN
  Filled 2018-03-29: qty 15

## 2018-03-29 MED ORDER — DIAZEPAM 5 MG PO TABS
10.0000 mg | ORAL_TABLET | Freq: Two times a day (BID) | ORAL | Status: DC
  Administered 2018-03-29 – 2018-04-01 (×5): 10 mg via ORAL
  Filled 2018-03-29 (×6): qty 2

## 2018-03-29 MED ORDER — LORAZEPAM 2 MG/ML IJ SOLN
1.0000 mg | INTRAMUSCULAR | Status: DC | PRN
  Administered 2018-03-29 – 2018-04-01 (×4): 1 mg via INTRAVENOUS
  Filled 2018-03-29 (×4): qty 1

## 2018-03-29 MED ORDER — FENTANYL CITRATE (PF) 100 MCG/2ML IJ SOLN
50.0000 ug | INTRAMUSCULAR | Status: DC | PRN
  Filled 2018-03-29: qty 2

## 2018-03-29 MED ORDER — ORAL CARE MOUTH RINSE
15.0000 mL | Freq: Two times a day (BID) | OROMUCOSAL | Status: DC
  Administered 2018-03-29: 15 mL via OROMUCOSAL

## 2018-03-29 MED ORDER — FENTANYL CITRATE (PF) 100 MCG/2ML IJ SOLN
25.0000 ug | INTRAMUSCULAR | Status: DC | PRN
  Administered 2018-03-29: 25 ug via INTRAVENOUS
  Administered 2018-03-29: 50 ug via INTRAVENOUS
  Administered 2018-03-29 (×2): 25 ug via INTRAVENOUS
  Administered 2018-03-30: 100 ug via INTRAVENOUS
  Administered 2018-03-30: 50 ug via INTRAVENOUS
  Administered 2018-03-30: 100 ug via INTRAVENOUS
  Filled 2018-03-29 (×7): qty 2

## 2018-03-29 MED ORDER — GLYCOPYRROLATE 0.2 MG/ML IJ SOLN: 0.2000 mg | INTRAMUSCULAR | Status: DC | PRN

## 2018-03-29 MED ORDER — ACETAMINOPHEN 650 MG RE SUPP: 650.0000 mg | RECTAL | Status: DC | PRN

## 2018-03-29 MED ORDER — HALOPERIDOL 0.5 MG PO TABS
0.5000 mg | ORAL_TABLET | ORAL | Status: DC | PRN
  Filled 2018-03-29: qty 1

## 2018-03-29 MED ORDER — LORAZEPAM 2 MG/ML PO CONC: 1.0000 mg | ORAL | Status: DC | PRN

## 2018-03-29 MED ORDER — HALOPERIDOL LACTATE 5 MG/ML IJ SOLN: 0.5000 mg | INTRAMUSCULAR | Status: DC | PRN

## 2018-03-29 NOTE — Progress Notes (Signed)
Daily Progress Note   Patient Name: Kevin SimsDavid Sanderford       Date: 03/29/2018 DOB: 1960/12/01  Age: 57 y.o. MRN#: [id number] Attending Physician: Coralyn HellingSood, Vineet, MD Primary Care Physician: [name], James, MD Admit Date: 03/20/2018  Reason for Consultation/Follow-up: Establishing goals of care, Psychosocial/spiritual support and Terminal Care  Subjective: Discussed with Dr. Craige CottaSood.  He has already established that the patient does not want oxygen (via nasal canula) or the cor trak tube.  These things will be removed.  1 chest tube is being removed now.  The other chest tube will remain for comfort. Patient will be transferred to 6N.  His parents and son were here last night for the extubation.  Patient is alert and talking.  He is pleasant but confused.  He is talking about death in a friendly way attempting to be light hearted.   Assessment: Patient is extubated and full comfort.  He is currently on 3L of oxyen with sats in the 80s.  The oxygen will be removed per patient's wishes.  He will require heavy opioids for comfort and potential symptoms of air hunger.   Patient Profile/HPI:  57 y.o. male with past medical history of multiple back surgeries and recurrent staph infections, chronic wounds, chronic pain, severe PVD s/p LLE AKA, and severe malnutrition who was admitted on 03/20/2018 with severe sepsis and respiratory failure secondary to HCAP. Swallow evaluation indicated that he has dysphagia with aspiration. He developed right pneumo-thorax and required chest tubesHe had progressed out of the ICU but had recurrent respiratory failure secondary to mucous plugging. He underwent emergent bronchoscopy and intubation.  Length of Stay: 9  Current Medications: Scheduled Meds:  . diazepam  10  mg Oral Q12H  . fentaNYL  75 mcg Transdermal Q72H  . mupirocin ointment   Nasal BID    Continuous Infusions: . sodium chloride Stopped (03/28/18 1144)    PRN Meds: sodium chloride, acetaminophen, albuterol, antiseptic oral rinse, fentaNYL (SUBLIMAZE) injection, glycopyrrolate **OR** glycopyrrolate **OR** glycopyrrolate, haloperidol **OR** haloperidol **OR** haloperidol lactate, LORazepam **OR** LORazepam **OR** LORazepam, morphine CONCENTRATE **OR** morphine CONCENTRATE, ondansetron (ZOFRAN) IV, polyvinyl alcohol  Physical Exam        Pleasantly confused male, awake, in no distress CV tachy Resp regular no frank w/c/r Abdomen soft, thin  Vital Signs: BP 118/74 (BP Location: Left Arm)  Pulse (!) 120   Temp 98.2 F (36.8 C) (Oral)   Resp (!) 35   Ht 6\' 1"  (1.854 m)   Wt 83.9 kg   SpO2 93%   BMI 24.41 kg/m  SpO2: SpO2: 93 % O2 Device: O2 Device: Nasal Cannula O2 Flow Rate: O2 Flow Rate (L/min): 4 L/min  Intake/output summary:   Intake/Output Summary (Last 24 hours) at 03/29/2018 1205 Last data filed at 03/29/2018 0800 Gross per 24 hour  Intake 2322.15 ml  Output 2265 ml  Net 57.15 ml   LBM: Last BM Date: 03/29/18 Baseline Weight: Weight: 60.3 kg Most recent weight: Weight: 83.9 kg       Palliative Assessment/Data: 20%      Patient Active Problem List   Diagnosis Date Noted  . Acute respiratory failure with hypoxemia (HCC)   . Palliative care encounter   . Pneumothorax   . Pneumothorax on right   . Pressure injury of skin 03/22/2018  . Acute respiratory failure (HCC)   . Aspiration into airway   . Septic shock (HCC)   . Shortness of breath   . HCAP (healthcare-associated pneumonia) 03/20/2018  . Bacteremia   . Pressure injury of skin of sacral region   . DDD (degenerative disc disease), lumbosacral   . Chronic pain syndrome   . Leukocytosis   . Acute blood loss anemia   . Left buttock abscess 01/05/2018  . Stage III pressure ulcer of buttock (HCC)  01/05/2018  . MRSA bacteremia 01/05/2018  . Atherosclerosis of native artery of left lower extremity with gangrene (HCC) 01/05/2018  . Antibiotic-associated diarrhea 01/05/2018  . Proteus infection 01/05/2018  . Acute lower UTI 01/05/2018  . Pressure ulcer of left upper back, stage 3 (HCC) 01/05/2018  . Hypokalemia 01/05/2018  . H/O degenerative disc disease 01/05/2018  . Chronic narcotic dependence (HCC) 01/05/2018  . Disuse atrophy of muscle 01/05/2018  . Tobacco abuse counseling 01/05/2018  . Right middle lobe pulmonary nodule 01/05/2018  . Chronic diastolic CHF (congestive heart failure) (HCC) 01/05/2018  . Non-healing ulcer of groin with fat layer exposed (HCC) 01/05/2018  . Gangrene of left foot (HCC) 01/05/2018  . Severe protein-calorie malnutrition (HCC)   . DDD (degenerative disc disease), lumbar 11/02/2015  . Facet syndrome, lumbar 11/02/2015  . Sacroiliac joint dysfunction 11/02/2015    Palliative Care Plan    Recommendations/Plan: Patient has a long standing history of opioid and benzodiazepine dependence.  His parents have repeatedly stated they do not want him in pain.   He may require significant amounts of opioids and benzodiazepines for comfort.   I am concerned he will decline quickly when his oxygen is removed and will likely need a morphine gtt.  I added back a fentanyl patch of 75 mcg (which will be in full effect 11/16 or 11/17).  Will increase range and frequency of fentanyl PRN.  I have also added back his home dose of oral valium, but am concerned that he will not be able to take oral medications for long. Have added comfort care order set including PRNs of SL morphine, ativan, and robinul.  Goals of Care and Additional Recommendations:  Limitations on Scope of Treatment: Full Comfort Care  Code Status:  DNR  Prognosis:   < 2 weeks May be much shorter if he aspirates or plugs.  Discharge Planning:  To Be Determined possible Hospital death vs Hospice  House.  Care plan was discussed with CCM attending.  Thank you for allowing the Palliative Medicine Team  to assist in the care of this patient.  Total time spent:  35 min.     Greater than 50%  of this time was spent counseling and coordinating care related to the above assessment and plan.  Florentina Jenny, PA-C Palliative Medicine  Please contact Palliative MedicineTeam phone at [phone] for questions and concerns between 7 am - 7 pm.   Please see AMION for individual provider pager numbers.

## 2018-03-29 NOTE — Progress Notes (Signed)
Pt noted to have o2 sat in mid 80s on RA.  Discussed with Dr. Violet BaldyAventura.  Advised for pt comfort nasal canula appropriate.  Nasal Canula at 4L placed on pt.  Will continue to monitor.

## 2018-03-29 NOTE — Progress Notes (Signed)
   03/29/18 1400  Clinical Encounter Type  Visited With Patient and family together  Visit Type Initial;Spiritual support;Psychological support;Social support;Other (Comment) (pt moved to palliative care)  Referral From Other (Comment) Higher education careers adviser)  Spiritual Encounters  Spiritual Needs Emotional;Grief support  Stress Factors  Patient Stress Factors Major life changes;Loss of control;Exhausted;Other (Comment) (difficulty communicating verbally)  Family Stress Factors Major life changes;Loss of control;Other (Comment)   Met w/ pt and his parents in room.  Pt had been moved to unit for palliative care.  Spoke w/ pt first.  He was trying very hard to communicate, although it was difficult to understand words and some sentences.  Chaplain used reflective listening.  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  While pt's blood pressure was taken, I introduced myself to the pt's parents who were sitting on the love seat in the window of the room.  Mother told me about the various medical challenges pt has had over the years.  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  Mother seemed to be accepting of her son's condition and of his expected demise, while the father kept talking about how he wants more time with his son and keeps asking God to make his son better so he can have more time.  Parents expressed appreciation for the visit and pt exhibited positive non-verbal cues.  Chaplain called away, but will attempt visit again if possible.  Also updated palliative care chaplain Dorian Pod).    Myra Gianotti resident, [phone]

## 2018-03-29 NOTE — Progress Notes (Signed)
Pt transfer from 77M, alert and responsive, on comfort care, with 02 [place] at 2 liters/hour, right upper chest tube water seal with level at 80 ml output, foley cath, low bed air mattress, left and right peripheral IV site AC- saline lock, his father and mother at the bedside, multiple pressure ulcer with deep tissue injury, also left aka, and flexiseal.

## 2018-03-29 NOTE — Progress Notes (Signed)
NAME:  Kevin Whitney, MRN:  [id number], DOB:  05-09-1961, LOS: 9 ADMISSION DATE:  03/20/2018, CONSULTATION DATE:  03/20/2018 REFERRING MD: Dr. 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 Plugged off, intubated suctioning via bronchoscopy 11/14 DNR, extubated 11/15 transition to comfort measures  Consults: date of consult/date signed off & final recs:  11/06 > Wound ostomy consult 11/12 > palliative care   Procedures (surgical and bedside):  ETT 11/8 > 11/9 Rt chest tube x 2 11/10 > ETT 11/12 >   Significant Diagnostic Tests:  11/6 blood cultures-negative  Micro Data:  11/06 Blood >> No Growth 11/12 resp >>   Antimicrobials:  11/12 vanc > 11/15  INTERVAL HISTORY:  He is very uncomfortable.  Still needing supplemental oxygen.  Understands that his quality of life is poor, and he does not want to continue like this.  Wants to have NG tube removed and doesn't want to wear oxygen anymore.    Objective   Blood pressure 118/74, pulse (!) 120, temperature 98.2 F (36.8 C), temperature  source Oral, resp. rate (!) 35, height 6\' 1"  (1.854 m), weight 83.9 kg, SpO2 93 %.      Intake/Output Summary (Last 24 hours) at 03/29/2018 0946 Last data filed at 03/29/2018 0800 Gross per 24 hour  Intake 3086.9 ml  Output 2490 ml  Net 596.9 ml   Examination:  General - cachectic, using accessory muscles Eyes - pupils reactive ENT -  NG tube in place Cardiac - regular, tachycardic Chest - decreased BS, poor air movement, no air leak in chest tubes Abdomen - soft, non tender, + bowel sounds Extremities - decreased muscle bulk Skin - stage 3 Lt scapular wound, stage 2 Lt buttock wound Neuro - alert, follows commands, responds appropriately   Assessment & Plan:   Acute hypoxic respiratory failure from HCAP, mucus plugging, and Rt pneumothorax. - DNR/DNI - pt has opted not to use supplemental oxygen - d/c inferior chest tube 11/15 - keep superior chest tube to water seal - prn NT suctioning - d/c ABx 11/15  Lt scapular and buttock pressure wounds. - present prior to this admission - wound care  Hypotension. Peripheral vascular disease. - d/c midodrine  Pain control. - prn tylenol and fentanyl  GERD. - d/c pepcid  Severe protein calorie malnutrition. - pt has opted to have NG tube removed and stop tube feedings  Goals of care - had discussion with pt.  He has very poor quality of life.  He had opportunity to visit his family on 11/14.  He does not want to continue active medical therapies, and wants therapy focus to shift toward comfort measures - appreciate assistance  from palliative care team - I would anticipate he will not be able to survive to hospital discharge.   Disposition:  DNR/DNI, comfort measures.  Will ask [facility] to assume primary care again from 11/16 and PCCM will f/u for chest tube management.  Will transfer to medical floor bed.   Coralyn HellingVineet Terence Googe, MD [facility] 03/29/2018, 10:05 AM

## 2018-03-29 NOTE — Progress Notes (Signed)
Nutrition Brief Note  Chart reviewed. Pt now transitioning to comfort care.  No further nutrition interventions warranted at this time.  Please re-consult as needed.   Jamari Moten A. Shakena Callari, RD, LDN, CDE Pager: [phone] After hours Pager: [phone]  

## 2018-03-29 NOTE — Evaluation (Signed)
Clinical/Bedside Swallow Evaluation Patient Details  Name: Kevin Whitney MRN: [id number] Date of Birth: 1960-06-24  Today's Date: 03/29/2018 Time: SLP Start Time (ACUTE ONLY): 0851 SLP Stop Time (ACUTE ONLY): 0907 SLP Time Calculation (min) (ACUTE ONLY): 16 min  Past Medical History:  Past Medical History:  Diagnosis Date  . Acute blood loss anemia 12/2017  . Bacteremia   . Broken legs   . CHF (congestive heart failure) (HCC)   . Chronic pain   . Chronic pain syndrome   . Complication of anesthesia    " i hallucinate "  . DDD (degenerative disc disease), lumbar   . 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 MRSA (methicillin resistant staph aureus) culture positive    2003   . Pressure injury of skin of sacral region   . Protein calorie malnutrition (HCC)   . Staph infection   . UTI (urinary tract infection) 12/2017   Past Surgical History:  Past Surgical History:  Procedure Laterality Date  . AMPUTATION Left 01/06/2018   Procedure: AMPUTATION ABOVE KNEE;  Surgeon: Nadara Mustard, MD;  Location: [facility] OR;  Service: Orthopedics;  Laterality: Left;  . BACK SURGERY    . DEBRIDMENT OF DECUBITUS ULCER N/A 01/07/2018   Procedure: DEBRIDMENT OF DECUBITUS ULCER;  Surgeon: Manus Rudd, MD;  Location: [facility] OR;  Service: General;  Laterality: N/A;  . FEMUR SURGERY     rt rod  . IR FLUORO GUIDE CV LINE RIGHT  01/07/2018  . IR US GUIDE VASC ACCESS RIGHT  01/07/2018  . surgery every 3 wks for mrsa    . WRIST SURGERY     bilaterally   HPI:  Pt is a 57 year old male with a lengthy past medical history including recent MRSA bacteremia related to a wound returns to our facility with acute respiratory failure with hypoxemia and severe sepsis from healthcare associated pneumonia on March 20, 2018.  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  Pt briefly intubated 11/8-9, under 48 hours.  FEES was completed 11/11 with high aspiration risk. Pt was reintubated 11/12 with one-way extubation 11/14.   Assessment / Plan / Recommendation Clinical Impression  Pt has a high risk for aspiration given mentation, deconditioning, results of recent FEES, and now an additional intubation. He needs Mod cues to initiate a pharyngeal swallow, and has immediate coughing with single pieces of ice. Recommend that he remain NPO unless pt/family decide to pursue comfort feeding. Given that FEES showed a high aspiration risk with all consistencies tested, would favor allowing comfort feeds of small spoonfuls of purees/sips of thin liquids if requesting. Will f/u pending GOC. SLP Visit Diagnosis: Dysphagia, oropharyngeal phase (R13.12)    Aspiration Risk  Severe aspiration risk;Risk for inadequate nutrition/hydration    Diet Recommendation NPO   Medication Administration: Via alternative means    Other  Recommendations Oral Care Recommendations: Oral care QID Other Recommendations: Have oral suction available   Follow up Recommendations (tba)      Frequency and Duration min 2x/week  2 weeks       Prognosis Prognosis for Safe Diet Advancement: Fair Barriers to Reach Goals: Cognitive deficits;Severity of deficits;Other (Comment)(overall prognosis)      Swallow Study   General HPI: Pt is a 57 year old male with a lengthy past medical history including  recent MRSA bacteremia related to a wound returns to our facility with acute respiratory failure with hypoxemia and severe sepsis from healthcare associated pneumonia on March 20, 2018.  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  Pt briefly intubated 11/8-9, under 48 hours.  FEES was completed 11/11 with high aspiration risk. Pt was reintubated 11/12 with one-way extubation 11/14. Type of Study: Bedside Swallow Evaluation Previous Swallow Assessment: see HPI Diet Prior to this Study: NPO;NG Tube Temperature Spikes Noted: No Respiratory Status: Nasal cannula History of Recent Intubation: Yes Length of Intubations (days): 4 days(across 2 intubations) Date extubated: 03/28/18 Behavior/Cognition: Alert;Confused;Requires cueing Oral Cavity Assessment: Within Functional Limits Oral Care Completed by SLP: Yes Oral Cavity - Dentition: Edentulous Self-Feeding Abilities: Total assist Patient Positioning: Upright in bed Baseline Vocal Quality: Low vocal intensity Volitional Cough: Weak Volitional Swallow: Unable to elicit    Oral/Motor/Sensory Function     Ice Chips Ice chips: Impaired Presentation: Spoon Pharyngeal Phase Impairments: Suspected delayed Swallow;Cough - Immediate   Thin Liquid Thin Liquid: Not tested    Nectar Thick Nectar Thick Liquid: Not tested   Honey Thick Honey Thick Liquid: Not tested   Puree Puree: Not tested   Solid     Solid: Not tested      Kevin Whitney, Kevin Whitney 03/29/2018,9:20 AM  Kevin HamLaura Whitney, M.A. CCC-SLP Acute Herbalistehabilitation Services Pager [phone] Office [phone]

## 2018-03-29 NOTE — Social Work (Signed)
CSW acknowledging consult for comfort care. Will follow for disposition should hospice services or residential hospice become appropriate.   Kevin Whitney, LCSWA [place] Clinical Social Work [phone]   

## 2018-03-30 DIAGNOSIS — F411 Generalized anxiety disorder: Secondary | ICD-10-CM

## 2018-03-30 DIAGNOSIS — R1310 Dysphagia, unspecified: Secondary | ICD-10-CM

## 2018-03-30 DIAGNOSIS — Z9189 Other specified personal risk factors, not elsewhere classified: Secondary | ICD-10-CM

## 2018-03-30 DIAGNOSIS — J189 Pneumonia, unspecified organism: Secondary | ICD-10-CM

## 2018-03-30 DIAGNOSIS — Z515 Encounter for palliative care: Secondary | ICD-10-CM

## 2018-03-30 DIAGNOSIS — G8929 Other chronic pain: Secondary | ICD-10-CM

## 2018-03-30 MED ORDER — FENTANYL CITRATE (PF) 100 MCG/2ML IJ SOLN
25.0000 ug | INTRAMUSCULAR | Status: DC | PRN
  Administered 2018-03-30 – 2018-03-31 (×5): 100 ug via INTRAVENOUS
  Filled 2018-03-30 (×5): qty 2

## 2018-03-30 NOTE — Progress Notes (Signed)
[facility] PCCM  S: resting comfortably, wants to eat  O:  Vitals:   03/29/18 0735 03/29/18 0800 03/29/18 1211 03/30/18 0525  BP:  118/74 98/68 (!) 130/104  Pulse:  (!) 120 (!) 130 (!) 119  Resp:  (!) 35 (!) 36 20  Temp: 98.2 F (36.8 C)  98.4 F (36.9 C) 98.9 F (37.2 C)  TempSrc: Oral  Oral Oral  SpO2:  93% 90% (!) 80%  Weight:      Height:       Ge: restin gin bed Lungs: clear, non-labored CV: RRR, no mgr  Chest tube: no air leak  Pneumothorax: remove chest tube, order placed Goals of care: comfort  Heber CarolinaBrent McQuaid, MD  PCCM Pager: [phone] Cell: [phone] If no response, call [phone]

## 2018-03-30 NOTE — Progress Notes (Signed)
Daily Progress Note   Patient Name: Kevin Whitney       Date: 03/30/2018 DOB: [birth year]  Age: 57 y.o. MRN#: [id number] Attending Physician: Delaine Lame, MD Primary Care Physician: [name], MD Admit Date: 03/20/2018  Reason for Consultation/Follow-up: Establishing goals of care and Terminal Care  Subjective/GOC:  Visited with Onalee Hua and his parents at bedside this morning. Kevin Whitney is awake, alert, and talking with his mother. He is pleasantly confused with flight of ideas. Attempted to re-orient Kevin Whitney on reasons for hospitalization including diagnoses and interventions. He tells me he does not have a collapsed lung--I explained this is new since admission and he was on the breathing machine (therefore not awake/alert to understand diagnosis of collapsed lung/need for chest tubes). Kevin Whitney asks me if the doctors are using him as a "test" for new medical advances. I explained that no he is not being used for testing and that breathing machines/chest tubes have been used for many decades.   I attempted to discuss plan for symptom management and Missael is intermittently confused. He does have a headache, for which I instructed RN to give prn medication.   He is demanding to drink Fanta at bedside (his favorite soda). Attempted to discuss high risk for aspiration/respiratory compromise but also allowing comfort feeds/drinks per his request. I do not think Kevin Whitney understands medical complexity of current clinical status.   Kevin Whitney tells me many stories of the past including his struggle with multiple back surgeries (starting in 2003) and living with chronic pain.   Kevin Whitney shares stories of his love for fixing tires on cars. Therapeutic listening and emotional support provided.   Kevin Whitney's mother  receives a call on her cell phone. Was unable to discuss hospice options this morning. Will need further discussion if patient remains stable post-extubation.    Length of Stay: 10  Current Medications: Scheduled Meds:  . diazepam  10 mg Oral Q12H  . fentaNYL  75 mcg Transdermal Q72H  . mupirocin ointment   Nasal BID    Continuous Infusions: . sodium chloride Stopped (03/28/18 1144)    PRN Meds: sodium chloride, acetaminophen, albuterol, antiseptic oral rinse, fentaNYL (SUBLIMAZE) injection, glycopyrrolate **OR** glycopyrrolate **OR** glycopyrrolate, haloperidol **OR** haloperidol **OR** haloperidol lactate, LORazepam **OR** LORazepam **OR** LORazepam, morphine CONCENTRATE **OR** morphine CONCENTRATE, ondansetron (ZOFRAN) IV, polyvinyl alcohol  Physical  Exam  Constitutional: He appears ill.  HENT:  Head: Normocephalic and atraumatic.  Cardiovascular: Regular rhythm.  Pulmonary/Chest: No accessory muscle usage. No tachypnea. No respiratory distress. He has decreased breath sounds.  3L [place]  Abdominal: There is no tenderness.  Neurological: He is alert.  Pleasantly confused. C/o of headache.  Skin: Skin is warm and dry. There is pallor.  Left AKA  Nursing note and vitals reviewed.          Vital Signs: BP (!) 130/104 (BP Location: Right Arm)   Pulse (!) 119   Temp 98.9 F (37.2 C) (Oral)   Resp 20   Ht 6\' 1"  (1.854 m)   Wt 83.9 kg   SpO2 (!) 80%   BMI 24.41 kg/m  SpO2: SpO2: (!) 80 % O2 Device: O2 Device: Nasal Cannula O2 Flow Rate: O2 Flow Rate (L/min): 3 L/min  Intake/output summary:   Intake/Output Summary (Last 24 hours) at 03/30/2018 1200 Last data filed at 03/30/2018 0500 Gross per 24 hour  Intake 120 ml  Output 2715 ml  Net -2595 ml   LBM: Last BM Date: 03/29/18 Baseline Weight: Weight: 60.3 kg Most recent weight: Weight: 83.9 kg       Palliative Assessment/Data: PPS 30%     Patient Active Problem List   Diagnosis Date Noted  . Acute respiratory  failure with hypoxemia (HCC)   . Palliative care encounter   . Pneumothorax   . Pneumothorax on right   . Pressure injury of skin 03/22/2018  . Acute respiratory failure (HCC)   . Aspiration into airway   . Septic shock (HCC)   . Shortness of breath   . HCAP (healthcare-associated pneumonia) 03/20/2018  . Bacteremia   . Pressure injury of skin of sacral region   . DDD (degenerative disc disease), lumbosacral   . Chronic pain syndrome   . Leukocytosis   . Acute blood loss anemia   . Left buttock abscess 01/05/2018  . Stage III pressure ulcer of buttock (HCC) 01/05/2018  . MRSA bacteremia 01/05/2018  . Atherosclerosis of native artery of left lower extremity with gangrene (HCC) 01/05/2018  . Antibiotic-associated diarrhea 01/05/2018  . Proteus infection 01/05/2018  . Acute lower UTI 01/05/2018  . Pressure ulcer of left upper back, stage 3 (HCC) 01/05/2018  . Hypokalemia 01/05/2018  . H/O degenerative disc disease 01/05/2018  . Chronic narcotic dependence (HCC) 01/05/2018  . Disuse atrophy of muscle 01/05/2018  . Tobacco abuse counseling 01/05/2018  . Right middle lobe pulmonary nodule 01/05/2018  . Chronic diastolic CHF (congestive heart failure) (HCC) 01/05/2018  . Non-healing ulcer of groin with fat layer exposed (HCC) 01/05/2018  . Gangrene of left foot (HCC) 01/05/2018  . Severe protein-calorie malnutrition (HCC)   . DDD (degenerative disc disease), lumbar 11/02/2015  . Facet syndrome, lumbar 11/02/2015  . Sacroiliac joint dysfunction 11/02/2015    Palliative Care Assessment & Plan   Patient Profile: 57 y.o. 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  He developed pneumothorax requiring chest tubes.  He has been found to have mod to severe dysphagia and aspiration.  A cortrak has  been placed for nutrition. Extubation to comfort measures 11/14.   Assessment: Acute respiratory failure HCAP Septic shock Pneumothorax right High risk for aspiration Hx of chronic pain and anxiety state Hx of malnutrition Hx of  recent left AKA Hx of multiple spinal surgeries   Recommendations/Plan:  Comfort measures. Extubated 11/14 with stable respiratory status on 3L.   Continue current medication regimen for comfort. Fentanyl TD and Valium PO restarted yesterday.  ? Surge of energy today. Patient awake, alert, pleasantly confused but able to have full conversation with parents and I.   Comfort feeds per patient/family request. High risk for aspiration and respiratory decompensation.   If patient remains stable for transfer, likely eligible for hospice facility placement. He was previously living home with parents.   Goals of Care and Additional Recommendations:  Limitations on Scope of Treatment: Full Comfort Care  Code Status: DNR   Code Status Orders  (From admission, onward)         Start     Ordered   03/29/18 1154  Do not attempt resuscitation (DNR)  Continuous    Question Answer Comment  In the event of cardiac or respiratory ARREST Do not call a "code blue"   In the event of cardiac or respiratory ARREST Do not perform Intubation, CPR, defibrillation or ACLS   In the event of cardiac or respiratory ARREST Use medication by any route, position, wound care, and other measures to relive pain and suffering. May use oxygen, suction and manual treatment of airway obstruction as needed for comfort.      03/29/18 1154        Code Status History    Date Active Date Inactive Code Status Order ID Comments User Context   03/27/2018 1037 03/29/2018 1154 DNR [id number]  Lupita LeashMcQuaid, [name] B, MD Inpatient   03/24/2018 1657 03/27/2018 1037 Partial Code [id number]  Chilton GreathouseMannam, Praveen, MD Inpatient   03/20/2018 1816 03/24/2018 1657 Full Code [id number]  Lupita LeashMcQuaid, [name] B, MD  Inpatient   01/05/2018 1447 01/17/2018 1641 Full Code [id number]  Lahoma CrockerSheehan, Theresa C, MD Inpatient       Prognosis:   < 2 weeks: if not less with high risk for decline secondary to acute respiratory failure, pneumothorax, high risk for aspiration, and multiple co-morbidities.  Discharge Planning:  To Be Determined  Care plan was discussed with patient, parents, RN  Thank you for allowing the Palliative Medicine Team to assist in the care of this patient.   Time In: 1110 Time Out: 1210 Total Time 60 Prolonged Time Billed  no      Greater than 50%  of this time was spent counseling and coordinating care related to the above assessment and plan.  Vennie HomansMegan Kerri Asche, FNP-C Palliative Medicine Team  Phone: [phone] Fax: [phone]  Please contact Palliative Medicine Team phone at [phone] for questions and concerns.

## 2018-03-30 NOTE — Progress Notes (Signed)
PROGRESS NOTE    Kevin Whitney  ZOX:[id number] DOB: Jun 17, 1960 DOA: 03/20/2018 PCP: [name], MD   Brief Narrative:  57 year old with past medical history relevant for multiple back surgeries, opiate and benzodiazepine dependence, multiple pressure ulcers due to debility status post right AKA, MRSA infections admitted from nursing home with H CAP and subsequently developed acute hypoxic respiratory failure requiring intubation and pneumothorax requiring chest tube with prolonged hospitalization.  Patient has been transferred to the ICU fully comfort care right now.   Assessment & Plan:   Active Problems:   HCAP (healthcare-associated pneumonia)   Acute respiratory failure (HCC)   Aspiration into airway   Septic shock (HCC)   Shortness of breath   Pressure injury of skin   Pneumothorax   Pneumothorax on right   Palliative care encounter   Acute respiratory failure with hypoxemia (HCC)   #) Comfort care: Because of patient's multiple debilitating problems and extremely poor quality of life patient and his family have decided to transition to comfort care. -Palliative care following -Continue fentanyl 75 mg every 3 days -Continue PRN IV fentanyl and p.o. oral morphine for dyspnea -Continue glycopyrrolate for secretions -Continue haloperidol for agitation -Continue scheduled Valium for anxiety - Depending on how patient will declare himself will discharge to residential hospice  #) MRSA infection/pressure ulcers: Patient has had a long history of the severe pressure ulcers.  He has no transition completely to comfort care.  He is currently off all antibiotics.  #) Hyperlipidemia: Atorvastatin discontinued  #) Chronic back pain/psych: Patient has had multiple back surgeries. -Continue diazepam per above -Symptom management per above  Fluids: Tolerating p.o. Electrodes: We will stop checking labs Nutrition: Diet as tolerated  Prophylaxis: None  Disposition: Pending  either death in the hospital or discharge to residential hospice  DO NOT RESUSCITATE    Consultants:   PCCM  Palliative care  Procedures:   Endotracheal intubation 03/22/2018 to 03/23/2018  Chest tube x2 on 03/24/2018  Endotracheal intubation 03/26/2018 to 03/28/2018  Bronchoscopy 03/26/2018  Antimicrobials:   IV vancomycin 03/26/2018 to 03/29/2018   Subjective: Patient was transferred from the ICU team on comfort care.  He is not awoken this morning as he is on multiple medications.  He appears to be comfortable.  Objective: Vitals:   03/29/18 0735 03/29/18 0800 03/29/18 1211 03/30/18 0525  BP:  118/74 98/68 (!) 130/104  Pulse:  (!) 120 (!) 130 (!) 119  Resp:  (!) 35 (!) 36 20  Temp: 98.2 F (36.8 C)  98.4 F (36.9 C) 98.9 F (37.2 C)  TempSrc: Oral  Oral Oral  SpO2:  93% 90% (!) 80%  Weight:      Height:        Intake/Output Summary (Last 24 hours) at 03/30/2018 1053 Last data filed at 03/30/2018 0500 Gross per 24 hour  Intake 120 ml  Output 2715 ml  Net -2595 ml   Filed Weights   03/27/18 0255 03/28/18 0500 03/29/18 0324  Weight: 83 kg 85.3 kg 83.9 kg    Examination:  General exam: Not responsive Respiratory system: Scattered rhonchi, mildly increased work of breathing, hypercapnia Cardiovascular system: Distant heart sounds, regular rate and rhythm, no murmurs Gastrointestinal system: Soft, nondistended, diminished bowel sounds Central nervous system: Somnolent, not aroused Extremities: Left AKA Skin: Numerous pressure ulcers Psychiatry: Not assessed due to medical condition    Data Reviewed: I have personally reviewed following labs and imaging studies  CBC: Recent Labs  Lab 03/25/18 0244 03/26/18 0353 03/27/18 0700  03/28/18 0628 03/29/18 0253  WBC 14.9* 22.1* 15.7* 22.1* 19.2*  NEUTROABS  --  19.8*  --   --  16.7*  HGB 10.1* 11.2* 8.0* 9.7* 8.4*  HCT 33.8* 37.9* 25.8* 31.5* 27.9*  MCV 89.4 89.0 90.8 89.7 88.6  PLT 185 242 97*  198 252   Basic Metabolic Panel: Recent Labs  Lab 03/25/18 1710  03/26/18 0353 03/26/18 1611 03/27/18 0700 03/28/18 0628 03/29/18 0253  NA  --    < > 143 146* 147* 144 141  K  --    < > 3.0* 3.3* 3.2* 2.6* 4.0  CL  --    < > 112* 114* 119* 112* 113*  CO2  --    < > 27 30 25 24 23   GLUCOSE  --    < > 143* 125* 121* 103* 206*  BUN  --    < > 11 10 16 11 9   CREATININE  --    < > 0.57* 0.52* 0.56* 0.67 0.52*  CALCIUM  --    < > 7.6* 7.7* 6.8* 7.3* 7.0*  MG 2.0  --  1.9 1.9 1.7 1.7  --   PHOS 2.5  --  1.3* 1.4* 1.2* 2.0* 1.5*   < > = values in this interval not displayed.   GFR: Estimated Creatinine Clearance: 117.9 mL/min (A) (by C-G formula based on SCr of 0.52 mg/dL (L)). Liver Function Tests: Recent Labs  Lab 03/29/18 0253  AST 31  ALT 40  ALKPHOS 48  BILITOT 0.8  PROT 4.9*  ALBUMIN 1.5*   No results for input(s): LIPASE, AMYLASE in the last 168 hours. No results for input(s): AMMONIA in the last 168 hours. Coagulation Profile: No results for input(s): INR, PROTIME in the last 168 hours. Cardiac Enzymes: No results for input(s): CKTOTAL, CKMB, CKMBINDEX, TROPONINI in the last 168 hours. BNP (last 3 results) No results for input(s): PROBNP in the last 8760 hours. HbA1C: No results for input(s): HGBA1C in the last 72 hours. CBG: Recent Labs  Lab 03/28/18 1530 03/28/18 1928 03/28/18 2346 03/29/18 0348 03/29/18 0730  GLUCAP 94 90 96 93 118*   Lipid Profile: No results for input(s): CHOL, HDL, LDLCALC, TRIG, CHOLHDL, LDLDIRECT in the last 72 hours. Thyroid Function Tests: No results for input(s): TSH, T4TOTAL, FREET4, T3FREE, THYROIDAB in the last 72 hours. Anemia Panel: No results for input(s): VITAMINB12, FOLATE, FERRITIN, TIBC, IRON, RETICCTPCT in the last 72 hours. Sepsis Labs: Recent Labs  Lab 03/27/18 0700  PROCALCITON <0.10    Recent Results (from the past 240 hour(s))  Culture, blood (Routine x 2)     Status: None   Collection Time: 03/20/18   1:05 PM  Result Value Ref Range Status   Specimen Description BLOOD RIGHT ANTECUBITAL  Final   Special Requests   Final    BOTTLES DRAWN AEROBIC AND ANAEROBIC Blood Culture results may not be optimal due to an inadequate volume of blood received in culture bottles   Culture   Final    NO GROWTH 5 DAYS Performed at [facility], [address], [place], [place] [address]    Report Status 03/25/2018 FINAL  Final  Culture, blood (Routine x 2)     Status: None   Collection Time: 03/20/18  2:26 PM  Result Value Ref Range Status   Specimen Description BLOOD RIGHT FOREARM  Final   Special Requests   Final    BOTTLES DRAWN AEROBIC ONLY Blood Culture results may not be optimal  due to an inadequate volume of blood received in culture bottles   Culture   Final    NO GROWTH 5 DAYS Performed at [facility], [address] [place], [place] [address]    Report Status 03/25/2018 FINAL  Final  MRSA PCR Screening     Status: Abnormal   Collection Time: 03/20/18  6:22 PM  Result Value Ref Range Status   MRSA by PCR POSITIVE (A) NEGATIVE Final    Comment:        The GeneXpert MRSA Assay (FDA approved for NASAL specimens only), is one component of a comprehensive MRSA colonization surveillance program. It is not intended to diagnose MRSA infection nor to guide or monitor treatment for MRSA infections. RESULT CALLED TO, READ BACK BY AND VERIFIED WITH: J.MOGLEY,RN AT 2115 03/20/18 BY L.PITT   Culture, respiratory (non-expectorated)     Status: None   Collection Time: 03/26/18  5:28 PM  Result Value Ref Range Status   Specimen Description BRONCHIAL WASHINGS  Final   Special Requests NONE  Final   Gram Stain   Final    ABUNDANT WBC PRESENT, PREDOMINANTLY PMN FEW GRAM POSITIVE COCCI Performed at [facility], [address] [place], [place] [address]    Culture FEW MULTIPLE ORGANISMS PRESENT, NONE PREDOMINANT  Final   Report Status 03/29/2018 FINAL  Final          Radiology Studies: Dg Chest 1 View  Result Date: 03/29/2018 CLINICAL DATA:  Shortness of Breath EXAM: CHEST  1 VIEW COMPARISON:  March 27, 2018 FINDINGS: Chest tubes on the right are unchanged in position. Feeding tube tip is below the diaphragm. No evident pneumothorax. There is subcutaneous air on the right inferiorly. There is airspace consolidation in portions of the right mid lower lung zones with loculated pleural effusion on the right. There is mild left base atelectasis. Left lung otherwise clear. Heart is slightly enlarged with pulmonary vascularity normal. No adenopathy. There is aortic atherosclerosis. There is postoperative change in the lower thoracic region. IMPRESSION: Tube positions as described without pneumothorax. There is soft tissue air laterally on the right inferiorly. There is patchy infiltrate in the right mid lower lung zones with fairly small loculated pleural effusion on the right, slightly increased from 2 days prior. Left lung is clear except for mild left base atelectasis. Stable cardiac silhouette. There is aortic atherosclerosis. Aortic Atherosclerosis (ICD10-I70.0). Electronically Signed   By: Bretta BangWilliam  Woodruff III M.D.   On: 03/29/2018 07:40        Scheduled Meds: . diazepam  10 mg Oral Q12H  . fentaNYL  75 mcg Transdermal Q72H  . mupirocin ointment   Nasal BID   Continuous Infusions: . sodium chloride Stopped (03/28/18 1144)     LOS: 10 days    Time spent: 35    Delaine LameShrey C Kaian Fahs, MD [facility]  If 7PM-7AM, please contact night-coverage [email or web address] Password [email or web address] 03/30/2018, 10:53 AM

## 2018-03-31 ENCOUNTER — Inpatient Hospital Stay (HOSPITAL_COMMUNITY): Payer: Medicare Other

## 2018-03-31 DIAGNOSIS — Z7189 Other specified counseling: Secondary | ICD-10-CM

## 2018-03-31 MED ORDER — NALOXONE HCL 0.4 MG/ML IJ SOLN: 0.4000 mg | INTRAMUSCULAR | Status: DC | PRN

## 2018-03-31 MED ORDER — ONDANSETRON HCL 4 MG/2ML IJ SOLN: 4.0000 mg | Freq: Four times a day (QID) | INTRAMUSCULAR | Status: DC | PRN

## 2018-03-31 MED ORDER — HYDROMORPHONE 1 MG/ML IV SOLN
INTRAVENOUS | Status: DC
  Administered 2018-03-31: 1.35 mg via INTRAVENOUS
  Administered 2018-03-31: 1.86 mg via INTRAVENOUS
  Administered 2018-03-31: 25 mg via INTRAVENOUS
  Administered 2018-04-01: 1.12 mg via INTRAVENOUS
  Administered 2018-04-01: 1.89 mg via INTRAVENOUS
  Administered 2018-04-01: 1.3 mg via INTRAVENOUS
  Filled 2018-03-31: qty 25

## 2018-03-31 MED ORDER — DIPHENHYDRAMINE HCL 50 MG/ML IJ SOLN
12.5000 mg | Freq: Four times a day (QID) | INTRAMUSCULAR | Status: DC | PRN
  Administered 2018-04-01: 12.5 mg via INTRAVENOUS
  Filled 2018-03-31: qty 1

## 2018-03-31 MED ORDER — SODIUM CHLORIDE 0.9% FLUSH: 9.0000 mL | INTRAVENOUS | Status: DC | PRN

## 2018-03-31 MED ORDER — DIPHENHYDRAMINE HCL 12.5 MG/5ML PO ELIX: 12.5000 mg | ORAL_SOLUTION | Freq: Four times a day (QID) | ORAL | Status: DC | PRN

## 2018-03-31 NOTE — Progress Notes (Signed)
PROGRESS NOTE    Kevin SimsDavid Whitney  WUJ:[id number] DOB: June 28, 1960 DOA: 03/20/2018 PCP: [name], James, MD   Brief Narrative:  57 year old with past medical history relevant for multiple back surgeries, opiate and benzodiazepine dependence, multiple pressure ulcers due to debility status post right AKA, MRSA infections admitted from nursing home with H CAP and subsequently developed acute hypoxic respiratory failure requiring intubation and pneumothorax requiring chest tube with prolonged hospitalization.  Patient has been transferred to the ICU fully comfort care right now.   Assessment & Plan:   Active Problems:   HCAP (healthcare-associated pneumonia)   Acute respiratory failure (HCC)   Aspiration into airway   Septic shock (HCC)   Shortness of breath   Pressure injury of skin   Pneumothorax   Pneumothorax on right   Terminal care   Acute respiratory failure with hypoxemia (HCC)   Palliative care by specialist   Community acquired pneumonia   At high risk for aspiration   Dysphagia   Other chronic pain   Anxiety state   #) Comfort care: Because of patient's multiple debilitating problems and extremely poor quality of life patient and his family have decided to transition to comfort care. -Palliative care following -Continue fentanyl 75 mg every 3 days -Continue PRN IV fentanyl and p.o. oral morphine for dyspnea -Continue glycopyrrolate for secretions -Continue haloperidol for agitation -Continue scheduled Valium for anxiety - Depending on how patient will declare himself will discharge to residential hospice  #) MRSA infection/pressure ulcers: Patient has had a long history of the severe pressure ulcers.  He has no transition completely to comfort care.  He is currently off all antibiotics.  #) Pneumothorax: -We will remove chest tube today  #) Hyperlipidemia: Atorvastatin discontinued  #) Chronic back pain/psych: Patient has had multiple back surgeries. -Continue  diazepam per above -Symptom management per above  Fluids: Tolerating p.o. Electrodes: We will stop checking labs Nutrition: Diet as tolerated  Prophylaxis: None  Disposition: Pending either death in the hospital or discharge to residential hospice  DO NOT RESUSCITATE    Consultants:   PCCM  Palliative care  Procedures:   Endotracheal intubation 03/22/2018 to 03/23/2018  Chest tube x2 on 03/24/2018  Endotracheal intubation 03/26/2018 to 03/28/2018  Bronchoscopy 03/26/2018  Antimicrobials:   IV vancomycin 03/26/2018 to 03/29/2018   Subjective: Patient reports that he is in some pain.  He denies any nausea, vomiting, diarrhea, cough, congestion, rhinorrhea.  Objective: Vitals:   03/29/18 1211 03/30/18 0525 03/30/18 1435 03/31/18 0611  BP: 98/68 (!) 130/104 126/80 (!) 88/67  Pulse: (!) 130 (!) 119 (!) 116 (!) 122  Resp: (!) 36 20 20 (!) 24  Temp: 98.4 F (36.9 C) 98.9 F (37.2 C) 98.4 F (36.9 C) 98.2 F (36.8 C)  TempSrc: Oral Oral Oral Oral  SpO2: 90% (!) 80% 92% (!) 86%  Weight:      Height:        Intake/Output Summary (Last 24 hours) at 03/31/2018 1248 Last data filed at 03/31/2018 [id number] Gross per 24 hour  Intake -  Output 2000 ml  Net -2000 ml   Filed Weights   03/27/18 0255 03/28/18 0500 03/29/18 0324  Weight: 83 kg 85.3 kg 83.9 kg    Examination:  General exam: Not responsive Respiratory system: Scattered rhonchi, mildly increased work of breathing, hypercapnia Cardiovascular system: Distant heart sounds, regular rate and rhythm, no murmurs Gastrointestinal system: Soft, nondistended, diminished bowel sounds Central nervous system: Somnolent, not aroused Extremities: Left AKA Skin: Numerous pressure ulcers Psychiatry:  Not assessed due to medical condition    Data Reviewed: I have personally reviewed following labs and imaging studies  CBC: Recent Labs  Lab 03/25/18 0244 03/26/18 0353 03/27/18 0700 03/28/18 0628 03/29/18 0253    WBC 14.9* 22.1* 15.7* 22.1* 19.2*  NEUTROABS  --  19.8*  --   --  16.7*  HGB 10.1* 11.2* 8.0* 9.7* 8.4*  HCT 33.8* 37.9* 25.8* 31.5* 27.9*  MCV 89.4 89.0 90.8 89.7 88.6  PLT 185 242 97* 198 252   Basic Metabolic Panel: Recent Labs  Lab 03/25/18 1710  03/26/18 0353 03/26/18 1611 03/27/18 0700 03/28/18 0628 03/29/18 0253  NA  --    < > 143 146* 147* 144 141  K  --    < > 3.0* 3.3* 3.2* 2.6* 4.0  CL  --    < > 112* 114* 119* 112* 113*  CO2  --    < > 27 30 25 24 23   GLUCOSE  --    < > 143* 125* 121* 103* 206*  BUN  --    < > 11 10 16 11 9   CREATININE  --    < > 0.57* 0.52* 0.56* 0.67 0.52*  CALCIUM  --    < > 7.6* 7.7* 6.8* 7.3* 7.0*  MG 2.0  --  1.9 1.9 1.7 1.7  --   PHOS 2.5  --  1.3* 1.4* 1.2* 2.0* 1.5*   < > = values in this interval not displayed.   GFR: Estimated Creatinine Clearance: 117.9 mL/min (A) (by C-G formula based on SCr of 0.52 mg/dL (L)). Liver Function Tests: Recent Labs  Lab 03/29/18 0253  AST 31  ALT 40  ALKPHOS 48  BILITOT 0.8  PROT 4.9*  ALBUMIN 1.5*   No results for input(s): LIPASE, AMYLASE in the last 168 hours. No results for input(s): AMMONIA in the last 168 hours. Coagulation Profile: No results for input(s): INR, PROTIME in the last 168 hours. Cardiac Enzymes: No results for input(s): CKTOTAL, CKMB, CKMBINDEX, TROPONINI in the last 168 hours. BNP (last 3 results) No results for input(s): PROBNP in the last 8760 hours. HbA1C: No results for input(s): HGBA1C in the last 72 hours. CBG: Recent Labs  Lab 03/28/18 1530 03/28/18 1928 03/28/18 2346 03/29/18 0348 03/29/18 0730  GLUCAP 94 90 96 93 118*   Lipid Profile: No results for input(s): CHOL, HDL, LDLCALC, TRIG, CHOLHDL, LDLDIRECT in the last 72 hours. Thyroid Function Tests: No results for input(s): TSH, T4TOTAL, FREET4, T3FREE, THYROIDAB in the last 72 hours. Anemia Panel: No results for input(s): VITAMINB12, FOLATE, FERRITIN, TIBC, IRON, RETICCTPCT in the last 72  hours. Sepsis Labs: Recent Labs  Lab 03/27/18 0700  PROCALCITON <0.10    Recent Results (from the past 240 hour(s))  Culture, respiratory (non-expectorated)     Status: None   Collection Time: 03/26/18  5:28 PM  Result Value Ref Range Status   Specimen Description BRONCHIAL WASHINGS  Final   Special Requests NONE  Final   Gram Stain   Final    ABUNDANT WBC PRESENT, PREDOMINANTLY PMN FEW GRAM POSITIVE COCCI Performed at [facility], [address] [place], [place] [address]    Culture FEW MULTIPLE ORGANISMS PRESENT, NONE PREDOMINANT  Final   Report Status 03/29/2018 FINAL  Final         Radiology Studies: Dg Chest Port 1 View  Result Date: 03/31/2018 CLINICAL DATA:  Follow-up right pneumothorax EXAM: PORTABLE CHEST 1 VIEW COMPARISON:  03/29/2018 FINDINGS: Right-sided  pigtail catheter is again identified but somewhat uncoiled on the right. The basilar pigtail catheter on the right has been removed in the interval. No definitive pneumothorax is seen. Some mild pleural capping is noted on the right with a small right basilar effusion. Bibasilar atelectatic changes are seen. Cardiac shadow is stable. Postoperative changes are seen in the thoracic spine. Feeding catheter has been removed in the interval. IMPRESSION: Interval removal of inferior right chest tube. No recurrent pneumothorax is seen although slight increase in right pleural effusion is noted. Bibasilar atelectatic changes remain. Interval removal of feeding catheter. Electronically Signed   By: Alcide Clever M.D.   On: 03/31/2018 08:21        Scheduled Meds: . diazepam  10 mg Oral Q12H  . fentaNYL  75 mcg Transdermal Q72H  . mupirocin ointment   Nasal BID   Continuous Infusions: . sodium chloride Stopped (03/28/18 1144)     LOS: 11 days    Time spent: 35    Delaine Lame, MD [facility]  If 7PM-7AM, please contact night-coverage [email or web address] Password [email or web address] 03/31/2018, 12:48 PM

## 2018-03-31 NOTE — Progress Notes (Signed)
Daily Progress Note   Patient Name: Kevin Whitney       Date: 03/31/2018 DOB: Nov 19, 1960  Age: 57 y.o. MRN#: [id number] Attending Physician: Cristy Folks, MD Primary Care Physician: [place] Pink, MD Admit Date: 03/20/2018  Reason for Consultation/Follow-up: Establishing goals of care  Subjective: Wael is awake and alert but confused.   Length of Stay: 11  Current Medications: Scheduled Meds:  . diazepam  10 mg Oral Q12H  . fentaNYL  75 mcg Transdermal Q72H  . mupirocin ointment   Nasal BID    Continuous Infusions: . sodium chloride Stopped (03/28/18 1144)    PRN Meds: sodium chloride, acetaminophen, albuterol, antiseptic oral rinse, fentaNYL (SUBLIMAZE) injection, glycopyrrolate **OR** glycopyrrolate **OR** glycopyrrolate, haloperidol **OR** haloperidol **OR** haloperidol lactate, LORazepam **OR** LORazepam **OR** LORazepam, morphine CONCENTRATE **OR** morphine CONCENTRATE, ondansetron (ZOFRAN) IV, polyvinyl alcohol  Physical Exam  Constitutional: He appears well-developed. He has a sickly appearance. He appears ill.  Thin, frail  Cardiovascular: Tachycardia present.  Pulmonary/Chest: Effort normal. No accessory muscle usage. No tachypnea.  Mild-mod distress during conversation  Neurological: He is alert.  Nursing note and vitals reviewed.           Vital Signs: BP (!) 88/67 (BP Location: Right Arm)   Pulse (!) 122   Temp 98.2 F (36.8 C) (Oral)   Resp (!) 24   Ht _0  (1.854 m)   Wt 83.9 kg   SpO2 (!) 86%   BMI 24.41 kg/m  SpO2: SpO2: (!) 86 % O2 Device: O2 Device: Nasal Cannula O2 Flow Rate: O2 Flow Rate (L/min): 3 L/min  Intake/output summary:   Intake/Output Summary (Last 24 hours) at 03/31/2018 1007 Last data filed at 03/31/2018 3578 Gross per 24  hour  Intake -  Output 2000 ml  Net -2000 ml   LBM: Last BM Date: 03/30/18 Baseline Weight: Weight: 60.3 kg Most recent weight: Weight: 83.9 kg       Palliative Assessment/Data: 30%     Patient Active Problem List   Diagnosis Date Noted  . Palliative care by specialist   . Community acquired pneumonia   . At high risk for aspiration   . Dysphagia   . Other chronic pain   . Anxiety state   . Acute respiratory failure with hypoxemia ([place])   . Terminal care   .  Pneumothorax   . Pneumothorax on right   . Pressure injury of skin 03/22/2018  . Acute respiratory failure ([place])   . Aspiration into airway   . Septic shock ([place])   . Shortness of breath   . HCAP (healthcare-associated pneumonia) 03/20/2018  . Bacteremia   . Pressure injury of skin of sacral region   . DDD (degenerative disc disease), lumbosacral   . Chronic pain syndrome   . Leukocytosis   . Acute blood loss anemia   . Left buttock abscess 01/05/2018  . Stage III pressure ulcer of buttock ([place]) 01/05/2018  . MRSA bacteremia 01/05/2018  . Atherosclerosis of native artery of left lower extremity with gangrene ([place]) 01/05/2018  . Antibiotic-associated diarrhea 01/05/2018  . Proteus infection 01/05/2018  . Acute lower UTI 01/05/2018  . Pressure ulcer of left upper back, stage 3 ([place]) 01/05/2018  . Hypokalemia 01/05/2018  . H/O degenerative disc disease 01/05/2018  . Chronic narcotic dependence ([place]) 01/05/2018  . Disuse atrophy of muscle 01/05/2018  . Tobacco abuse counseling 01/05/2018  . Right middle lobe pulmonary nodule 01/05/2018  . Chronic diastolic CHF (congestive heart failure) ([place]) 01/05/2018  . Non-healing ulcer of groin with fat layer exposed ([place]) 01/05/2018  . Gangrene of left foot ([place]) 01/05/2018  . Severe protein-calorie malnutrition ([place])   . DDD (degenerative disc disease), lumbar 11/02/2015  . Facet syndrome, lumbar 11/02/2015  . Sacroiliac joint dysfunction 11/02/2015    Palliative  Care Assessment & Plan   HPI: 57 y.o. 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  He developed pneumothorax requiring chest tubes.  He has been found to have mod to severe dysphagia and aspiration.  A cor trak has been placed for nutrition.   Assessment: I met today at Clive's bedside. Jarreau is pleasant and has no complaints. He is smiling and appears to be in good spirits. He is confused but enjoys having conversation. He talks to me about tractor pulls and his childhood. Anthonny has no insight or much memory about any of his current health conditions. He does not even know that he is in the hospital. There are no family members at bedside. Kennieth appears to be stable and in no distress. I believe he could transition to hospice facility. He is high risk for decline during hospitalization given his overall debility and aspiration risks. As I was leaving his cousins where coming to bedside to visit.   Recommendations/Plan:  Consider transition to hospice facility.   No changes to comfort medications as he has no complaints during my visit.   Late entry: After discussing with Dr. Herbert Moors he has placed [name] on dilaudid PCA as he has witnessed much discomfort during his examinations. He discussed this with family.   Goals of Care and Additional Recommendations:  Limitations on Scope of Treatment: Full Comfort Care  Code Status:  DNR  Prognosis:   < 2 weeks  Discharge Planning:  Hospice facility vs hospital death.   Care plan was discussed with Dr. Herbert Moors.   Thank you for allowing the Palliative Medicine Team to assist in the care of this patient.   Total Time 20 min Prolonged Time Billed  no       Greater than 50%  of this time was spent counseling and coordinating care related to the above  assessment and plan.  Vinie Sill, NP Palliative Medicine Team Pager # [phone] (M-F 8a-5p) Team Phone # [phone] (Nights/Weekends)

## 2018-04-01 MED ORDER — GLYCOPYRROLATE 0.2 MG/ML IJ SOLN
0.2000 mg | INTRAMUSCULAR | Status: DC
  Administered 2018-04-01 – 2018-04-02 (×8): 0.2 mg via INTRAVENOUS
  Filled 2018-04-01 (×8): qty 1

## 2018-04-01 MED ORDER — SODIUM CHLORIDE 0.9 % IV SOLN
0.2500 mg/h | INTRAVENOUS | Status: DC
  Administered 2018-04-01: 0.25 mg/h via INTRAVENOUS
  Filled 2018-04-01: qty 2.5

## 2018-04-01 MED ORDER — LORAZEPAM 2 MG/ML IJ SOLN
1.0000 mg | Freq: Three times a day (TID) | INTRAMUSCULAR | Status: DC
  Administered 2018-04-01 – 2018-04-02 (×3): 1 mg via INTRAVENOUS
  Filled 2018-04-01 (×3): qty 1

## 2018-04-01 MED ORDER — HYDROMORPHONE BOLUS VIA INFUSION
0.3000 mg | INTRAVENOUS | Status: DC | PRN
  Administered 2018-04-01: 0.5 mg via INTRAVENOUS
  Filled 2018-04-01: qty 1

## 2018-04-01 MED ORDER — HYDROMORPHONE 1 MG/ML IV SOLN
INTRAVENOUS | Status: AC
  Administered 2018-04-01: 1.12 mg via INTRAVENOUS

## 2018-04-01 NOTE — Progress Notes (Addendum)
Palliative:  I met today multiple times at Reza's bedside. His parents are at bedside today. I spent time explaining to them Calen's decline with increased confusion, restlessness, increased respiratory distress/congestion, pallor. They are attributing Maan's decline since yesterday to dilaudid but I feel that his vital signs and respiratory status are declining closer to end of life and the fact he is requiring more medication is a sign of worsening and his body failing and shutting down. Parents appear to understand but I anticipate they will need continued education and support. I did explain that without the medication, which does cause sedation, he would be more distraught and uncomfortable. They agree to continue with measures to ensure comfort.  Emotional support provided.   Exam: More lethargic but arouses. Arouses briefly but falls back asleep. Pallor, worsening confusion and restlessness. Itching. Upper airway congestion.   Plan: - Continue comfort care plans.  - Unable to utilize dilaudid PCA so transitioned to infusion at same basal rate 0.25 mg/hr with bolus 0.3-0.5 mg every 30 min prn to be given by RN. Continue fentanyl patch.  - Ativan scheduled TID and prn for agitation and restlessness.  - Benadryl prn and requested RN to give dose.  - Significant decline since yesterday. Not stable for transfer and anticipate hospital death.  - Family will need continued support. Chaplain requested as well.   3 min  Vinie Sill, NP Palliative Medicine Team Pager # [phone] (M-F 8a-5p) Team Phone # [phone] (Nights/Weekends)

## 2018-04-01 NOTE — Progress Notes (Signed)
Palliative Medicine RN Note: Daily symptom check. Patient was transitioned back to hydromorphone continuous infusion with prn bolus doses by PMT NP due to his inability to manage a PCA. He is currently on RA. Cachectic, very pale, scabs on his face. He is scratching his face and head but denies itching.  Upon my arrival, Kevin Whitney had just gotten prn Ativan. He is very confused and agitated with a mitt on his right hand. I gave a bolus dose of hydromorphone and stayed with him for about 15 minutes with no symptom response to the Ativan or Dilaudid.   He did not respond to redirection and was unable to follow the thread of the conversation except to ask for [facility].   His parents are at bedside. They do not feel he is comfortable. I explained that we can get him comfortable but that he may become sleepy; if he is awake, he will not be comfortable d/t pain at chest tube site, coughing, agitation, delirium. His mother does not want him to be sedated because she feels like he still knows who everyone is and is enjoying interacting.   I called Kevin Whitney, PMT NP to come meet with his parents again to reinforce GOC decisions.  Kevin ChanceMelanie G. Annitta Fifield, RN, BSN, [facility] Palliative Medicine Team 04/01/2018 1:57 PM Office [phone]

## 2018-04-01 NOTE — Social Work (Signed)
Continuing to follow for disposition should hospice services or residential hospice become appropriate.   Doy HutchingIsabel H Elijio Staples, LCSWA [facility] Clinical Social Work [phone]

## 2018-04-01 NOTE — Progress Notes (Signed)
PROGRESS NOTE    Sajid Ruppert  JWJ:[id number] DOB: [birth year] DOA: 03/20/2018 PCP: [name], MD   Brief Narrative:  57 year old with past medical history relevant for multiple back surgeries, opiate and benzodiazepine dependence, multiple pressure ulcers due to debility status post right AKA, MRSA infections admitted from nursing home with H CAP and subsequently developed acute hypoxic respiratory failure requiring intubation and pneumothorax requiring chest tube with prolonged hospitalization.  Patient has been transferred to comfort care  And is being seen by Palliative Medicine.  Assessment & Plan:   Active Problems:   HCAP (healthcare-associated pneumonia)   Acute respiratory failure (HCC)   Aspiration into airway   Septic shock (HCC)   Shortness of breath   Pressure injury of skin   Pneumothorax   Pneumothorax on right   Terminal care   Acute respiratory failure with hypoxemia (HCC)   Palliative care by specialist   Community acquired pneumonia   At high risk for aspiration   Dysphagia   Other chronic pain   Anxiety state  #) Comfort care: Because of patient's multiple debilitating problems and extremely poor quality of life patient and his family have decided to transition to comfort care. -Palliative care following -Continue fentanyl 75 mg every 3 days -Continue PRN IV fentanyl and p.o. oral morphine for dyspnea -Continue glycopyrrolate for secretions -Continue haloperidol for agitation -Continue scheduled Valium for anxiety - Depending on how patient will declare himself will discharge to residential hospice  #) MRSA infection/pressure ulcers: Patient has had a long history of the severe pressure ulcers.  He has transitioned completely to comfort care.  He is currently off all antibiotics.  #) Pneumothorax: -We will remove chest tube today  #) Hyperlipidemia: Atorvastatin discontinued  #) Chronic back pain/psych: Patient has had multiple back  surgeries. -Continue diazepam per above -Symptom management per above  Fluids: Tolerating p.o. Electrodes: We will stop checking labs Nutrition: Diet as tolerated  Prophylaxis: None  Disposition: Pending either death in the hospital or discharge to residential hospice  DO NOT RESUSCITATE    Consultants:   PCCM  Palliative care  Procedures:   Endotracheal intubation 03/22/2018 to 03/23/2018  Chest tube x2 on 03/24/2018  Endotracheal intubation 03/26/2018 to 03/28/2018  Bronchoscopy 03/26/2018  Antimicrobials:   IV vancomycin 03/26/2018 to 03/29/2018   Subjective: Awake and alert seems a little agitated but denies pain at the present time.  Objective: Vitals:   03/31/18 2003 04/01/18 0031 04/01/18 0412 04/01/18 0423  BP:   (!) 82/53   Pulse:   (!) 127   Resp: (!) 25 (!) 25 17 17   Temp:   98.5 F (36.9 C)   TempSrc:   Oral   SpO2: (!) 72% (!) 82% (!) 64% (!) 64%  Weight:      Height:        Intake/Output Summary (Last 24 hours) at 04/01/2018 1314 Last data filed at 04/01/2018 0411 Gross per 24 hour  Intake 55 ml  Output 1605 ml  Net -1550 ml   Filed Weights   03/27/18 0255 03/28/18 0500 03/29/18 0324  Weight: 83 kg 85.3 kg 83.9 kg    Examination:  General exam: Appears calm and comfortable  Respiratory system: Clear to auscultation. Respiratory effort normal. Cardiovascular system: S1 & S2 heard, RRR. No JVD, murmurs, rubs, gallops or clicks. No pedal edema. Gastrointestinal system: Abdomen is nondistended, soft and nontender. No organomegaly or masses felt. Normal bowel sounds heard. Central nervous system: Alert and oriented. No focal neurological deficits. Extremities:  Symmetric 4 x 5 power. Skin: No rashes, lesions or ulcers Psychiatry: Judgement and insight appear normal. Mood & affect appropriate.     Data Reviewed: I have personally reviewed following labs and imaging studies  CBC: Recent Labs  Lab 03/26/18 0353  03/27/18 0700 03/28/18 0628 03/29/18 0253  WBC 22.1* 15.7* 22.1* 19.2*  NEUTROABS 19.8*  --   --  16.7*  HGB 11.2* 8.0* 9.7* 8.4*  HCT 37.9* 25.8* 31.5* 27.9*  MCV 89.0 90.8 89.7 88.6  PLT 242 97* 198 252   Basic Metabolic Panel: Recent Labs  Lab 03/25/18 1710  03/26/18 0353 03/26/18 1611 03/27/18 0700 03/28/18 0628 03/29/18 0253  NA  --    < > 143 146* 147* 144 141  K  --    < > 3.0* 3.3* 3.2* 2.6* 4.0  CL  --    < > 112* 114* 119* 112* 113*  CO2  --    < > 27 30 25 24 23   GLUCOSE  --    < > 143* 125* 121* 103* 206*  BUN  --    < > 11 10 16 11 9   CREATININE  --    < > 0.57* 0.52* 0.56* 0.67 0.52*  CALCIUM  --    < > 7.6* 7.7* 6.8* 7.3* 7.0*  MG 2.0  --  1.9 1.9 1.7 1.7  --   PHOS 2.5  --  1.3* 1.4* 1.2* 2.0* 1.5*   < > = values in this interval not displayed.   GFR: Estimated Creatinine Clearance: 117.9 mL/min (A) (by C-G formula based on SCr of 0.52 mg/dL (L)). Liver Function Tests: Recent Labs  Lab 03/29/18 0253  AST 31  ALT 40  ALKPHOS 48  BILITOT 0.8  PROT 4.9*  ALBUMIN 1.5*   CBG: Recent Labs  Lab 03/28/18 1530 03/28/18 1928 03/28/18 2346 03/29/18 0348 03/29/18 0730  GLUCAP 94 90 96 93 118*   Sepsis Labs: Recent Labs  Lab 03/27/18 0700  PROCALCITON <0.10    Recent Results (from the past 240 hour(s))  Culture, respiratory (non-expectorated)     Status: None   Collection Time: 03/26/18  5:28 PM  Result Value Ref Range Status   Specimen Description BRONCHIAL WASHINGS  Final   Special Requests NONE  Final   Gram Stain   Final    ABUNDANT WBC PRESENT, PREDOMINANTLY PMN FEW GRAM POSITIVE COCCI Performed at [facility], [address] [place], [place] [address]    Culture FEW MULTIPLE ORGANISMS PRESENT, NONE PREDOMINANT  Final   Report Status 03/29/2018 FINAL  Final         Radiology Studies: Dg Chest Port 1 View  Result Date: 03/31/2018 CLINICAL DATA:  Follow-up right pneumothorax EXAM: PORTABLE CHEST 1 VIEW COMPARISON:   03/29/2018 FINDINGS: Right-sided pigtail catheter is again identified but somewhat uncoiled on the right. The basilar pigtail catheter on the right has been removed in the interval. No definitive pneumothorax is seen. Some mild pleural capping is noted on the right with a small right basilar effusion. Bibasilar atelectatic changes are seen. Cardiac shadow is stable. Postoperative changes are seen in the thoracic spine. Feeding catheter has been removed in the interval. IMPRESSION: Interval removal of inferior right chest tube. No recurrent pneumothorax is seen although slight increase in right pleural effusion is noted. Bibasilar atelectatic changes remain. Interval removal of feeding catheter. Electronically Signed   By: Alcide CleverMark  Lukens M.D.   On: 03/31/2018 08:21  Scheduled Meds: . fentaNYL  75 mcg Transdermal Q72H  . glycopyrrolate  0.2 mg Intravenous Q4H  . mupirocin ointment   Nasal BID   Continuous Infusions: . sodium chloride 1,000 mL (03/31/18 1500)  . HYDROmorphone       LOS: 12 days    Time spent: 35 minutes    Lahoma Crocker, MD FACP [facility] Pager [phone]  If 7PM-7AM, please contact night-coverage [email or web address] Password [email or web address] 04/01/2018, 1:14 PM

## 2018-04-01 NOTE — Progress Notes (Signed)
Dilaudid pca discontinued and continuous drip of dilaudid initiated at 0.505ml/hr. 14mg  iv dilaudid with RN Emeline GinsLyndsey Moss per facility protocol

## 2018-04-02 MED ORDER — POLYVINYL ALCOHOL 1.4 % OP SOLN: 1.0000 [drp] | Freq: Four times a day (QID) | OPHTHALMIC | 0 refills | Status: AC | PRN

## 2018-04-02 MED ORDER — MORPHINE SULFATE (CONCENTRATE) 10 MG/0.5ML PO SOLN: 5.0000 mg | ORAL | Status: AC | PRN

## 2018-04-02 MED ORDER — HALOPERIDOL LACTATE 5 MG/ML IJ SOLN: 0.5000 mg | INTRAMUSCULAR | Status: AC | PRN

## 2018-04-02 MED ORDER — ACETAMINOPHEN 650 MG RE SUPP: 650.0000 mg | RECTAL | 0 refills | Status: AC | PRN

## 2018-04-02 MED ORDER — HALOPERIDOL 0.5 MG PO TABS: 0.5000 mg | ORAL_TABLET | ORAL | Status: AC | PRN

## 2018-04-02 MED ORDER — MUPIROCIN 2 % EX OINT: TOPICAL_OINTMENT | Freq: Two times a day (BID) | CUTANEOUS | 0 refills | Status: AC

## 2018-04-02 MED ORDER — HYDROMORPHONE BOLUS VIA INFUSION: 0.3000 mg | INTRAVENOUS | 0 refills | Status: AC | PRN

## 2018-04-02 MED ORDER — LORAZEPAM 2 MG/ML IJ SOLN: 1.0000 mg | Freq: Three times a day (TID) | INTRAMUSCULAR | 0 refills | Status: AC

## 2018-04-02 MED ORDER — BIOTENE DRY MOUTH MT LIQD: 15.0000 mL | OROMUCOSAL | Status: AC | PRN

## 2018-04-02 MED ORDER — FENTANYL 75 MCG/HR TD PT72: 75.0000 ug | MEDICATED_PATCH | TRANSDERMAL | 0 refills | Status: AC

## 2018-04-02 MED ORDER — LORAZEPAM 1 MG PO TABS: 1.0000 mg | ORAL_TABLET | ORAL | 0 refills | Status: AC | PRN

## 2018-04-02 MED ORDER — GLYCOPYRROLATE 1 MG PO TABS: 1.0000 mg | ORAL_TABLET | ORAL | Status: AC | PRN

## 2018-04-02 MED ORDER — SODIUM CHLORIDE 0.9 % IV SOLN: 0.2500 mg/h | INTRAVENOUS | Status: AC

## 2018-04-02 NOTE — Progress Notes (Signed)
   04/02/18 0949  Clinical Encounter Type  Visited With Patient  Visit Type Follow-up  Referral From Palliative care team  Consult/Referral To Chaplain  The chaplain responded to PMT request for Pt. spiritual care.  The Pt. was awake and open to conversation with the chaplain.  The chaplain witnessed the Pt.'s word "foggy" as he tried to communicate with the chaplain.  The common themes of conversation during the time with the patient were always positive and put others first. The Pt. effectively directed the conversation away from himself.  Spiritual care will follow up as needed.

## 2018-04-02 NOTE — Social Work (Signed)
Clinical Social Worker facilitated patient discharge including contacting patient family and facility to confirm patient discharge plans.  Clinical information faxed to facility and family agreeable with plan.  CSW arranged ambulance transport via [facility]. RN to call [phone]  with report prior to discharge.  Clinical Social Worker will sign off for now as social work intervention is no longer needed. Please consult us again if new need arises.  Doy HutchingIsabel H Ottilia Pippenger, [place] Clinical Social Worker [phone]

## 2018-04-02 NOTE — Social Work (Addendum)
CSW has made referral to [facility].  Cheri, hospice liaison, will follow up with pt and pt family.   12:49pm- CSW spoke with Cheri, pt has been approved and there is a bed available [facility]. Paged attending MD for discharge summary and orders.   Octavio GravesIsabel Marinell Igarashi, MSW, [facility] WayCSWA [place] Clinical Social Work [phone]

## 2018-04-02 NOTE — Progress Notes (Addendum)
Daily Progress Note   Patient Name: Kevin SimsDavid Whitney       Date: 04/02/2018 DOB: 1960-06-10  Age: 57 y.o. MRN#: [id number] Attending Physician: Lahoma CrockerSheehan, Theresa C, MD Primary Care Physician: [name], James, MD Admit Date: 03/20/2018  Reason for Consultation/Follow-up: Establishing goals of care, Non pain symptom management, Pain control and Psychosocial/spiritual support  Subjective: Patient is confused.  We talked about where he is and where he is going.  "I want to go where ever God wants me to go."  He asks for a Sprite and Seven.  He has no complaints of pain or discomfort.  He is smiling talking about the [employer] Mouse cartoon on TV.    Assessment: I spoke with my colleague, Yong ChannelAlicia Parker, NP who saw him yesterday.  Apparently his symptoms are much more controlled today on a dilaudid gtt.  Yesterday he was very uncomfortable and having difficulty with his secretions.  Symptoms controlled.  Will try D1 diet for comfort feeds.  He appears stable for hospice house.  If he eats well perhaps he has a little more time than anticipated.  Unfortunately he is very fragile and highly likely to decompensate again at any time.  Most recent Oxygen sat is 69%?   Patient Profile/HPI:  57 y.o. male with past medical history of multiple back surgeries and recurrent staph infections, chronic wounds, chronic pain, severe PVD s/p LLE AKA, and severe malnutrition who was admitted on 03/20/2018 with severe sepsis and respiratory failure secondary to HCAP. Swallow evaluation indicated that he has dysphagia with aspiration. He developed right pneumo-thorax and required chest tubesHe had progressed out of the ICU but had recurrent respiratory failure secondary to mucous plugging. He underwent emergent bronchoscopy  and intubation and remains on life support.    Length of Stay: 13  Current Medications: Scheduled Meds:  . fentaNYL  75 mcg Transdermal Q72H  . glycopyrrolate  0.2 mg Intravenous Q4H  . LORazepam  1 mg Intravenous TID  . mupirocin ointment   Nasal BID    Continuous Infusions: . sodium chloride 1,000 mL (03/31/18 1500)  . HYDROmorphone 0.25 mg/hr (04/02/18 0424)    PRN Meds: sodium chloride, acetaminophen, albuterol, antiseptic oral rinse, diphenhydrAMINE **OR** diphenhydrAMINE, glycopyrrolate **OR** glycopyrrolate **OR** glycopyrrolate, haloperidol **OR** haloperidol **OR** haloperidol lactate, HYDROmorphone, LORazepam **OR** LORazepam **OR** LORazepam, morphine CONCENTRATE **OR** morphine  CONCENTRATE, naloxone **AND** sodium chloride flush, ondansetron (ZOFRAN) IV, ondansetron (ZOFRAN) IV, polyvinyl alcohol  Physical Exam        Cachectically thin, temporal wasting.  Awake, alert, confused, smiling in a good mood. CV rrr resp no distress on 2L Abdomen thin, soft, nd  Vital Signs: BP (!) 73/62 (BP Location: Right Arm)   Pulse 84   Temp 98 F (36.7 C) (Oral)   Resp 16   Ht 6\' 1"  (1.854 m)   Wt 83.9 kg   SpO2 (!) 69%   BMI 24.41 kg/m  SpO2: SpO2: (!) 69 % O2 Device: O2 Device: Room Air O2 Flow Rate: O2 Flow Rate (L/min): 2 L/min  Intake/output summary:   Intake/Output Summary (Last 24 hours) at 04/02/2018 0841 Last data filed at 04/02/2018 0525 Gross per 24 hour  Intake 8.55 ml  Output 500 ml  Net -491.45 ml   LBM: Last BM Date: 04/01/18 Baseline Weight: Weight: 60.3 kg Most recent weight: Weight: 83.9 kg       Palliative Assessment/Data: 20%      Patient Active Problem List   Diagnosis Date Noted  . Palliative care by specialist   . Community acquired pneumonia   . At high risk for aspiration   . Dysphagia   . Other chronic pain   . Anxiety state   . Acute respiratory failure with hypoxemia (HCC)   . Terminal care   . Pneumothorax   .  Pneumothorax on right   . Pressure injury of skin 03/22/2018  . Acute respiratory failure (HCC)   . Aspiration into airway   . Septic shock (HCC)   . Shortness of breath   . HCAP (healthcare-associated pneumonia) 03/20/2018  . Bacteremia   . Pressure injury of skin of sacral region   . DDD (degenerative disc disease), lumbosacral   . Chronic pain syndrome   . Leukocytosis   . Acute blood loss anemia   . Left buttock abscess 01/05/2018  . Stage III pressure ulcer of buttock (HCC) 01/05/2018  . MRSA bacteremia 01/05/2018  . Atherosclerosis of native artery of left lower extremity with gangrene (HCC) 01/05/2018  . Antibiotic-associated diarrhea 01/05/2018  . Proteus infection 01/05/2018  . Acute lower UTI 01/05/2018  . Pressure ulcer of left upper back, stage 3 (HCC) 01/05/2018  . Hypokalemia 01/05/2018  . H/O degenerative disc disease 01/05/2018  . Chronic narcotic dependence (HCC) 01/05/2018  . Disuse atrophy of muscle 01/05/2018  . Tobacco abuse counseling 01/05/2018  . Right middle lobe pulmonary nodule 01/05/2018  . Chronic diastolic CHF (congestive heart failure) (HCC) 01/05/2018  . Non-healing ulcer of groin with fat layer exposed (HCC) 01/05/2018  . Gangrene of left foot (HCC) 01/05/2018  . Severe protein-calorie malnutrition (HCC)   . DDD (degenerative disc disease), lumbar 11/02/2015  . Facet syndrome, lumbar 11/02/2015  . Sacroiliac joint dysfunction 11/02/2015    Palliative Care Plan    Recommendations/Plan:  Will attempt D1 diet with feeding assistance and precautions for comfort/enjoyment.  Recommended Hospice House to parents and they are comfortable with that.  They are in [facility] and would want a hospice House in [place].  Continue current dilaudid gtt as he appears much better than yesterday and comfortable.  PMT will continue to follow and monitor progress.  Goals of Care and Additional Recommendations:  Limitations on Scope of  Treatment: Full Comfort Care  Code Status:  DNR  Prognosis:   < 2 weeks recurrent aspiration, pneumonia, mucous plugging, severe malnutrition, severe recurrent  infections now off of antibiotics.   Discharge Planning:  Hospice House in [place] please  Care plan was discussed with PMT team and parents.  Thank you for allowing the Palliative Medicine Team to assist in the care of this patient.  Total time spent:   35 min.     Greater than 50%  of this time was spent counseling and coordinating care related to the above assessment and plan.  Norvel Richards, PA-C Palliative Medicine  Please contact Palliative MedicineTeam phone at [phone] for questions and concerns between 7 am - 7 pm.   Please see AMION for individual provider pager numbers.

## 2018-04-02 NOTE — Progress Notes (Signed)
Report called to Cordelia PenSherry, Charity fundraiserN [facility]. [facility] here for transport at this time. Patient comfortable and medicated for transfer.

## 2018-04-02 NOTE — Progress Notes (Signed)
25ml of Dilaudid wasted and witnessed by  Ebony HailBernardia , RN prior to transfer.

## 2018-04-02 NOTE — Discharge Summary (Addendum)
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 Patient has been transferred to comfort care   he was seen by Palliative Medicine.  Has been accepted for transfer to the hospice house.  Patient has reached maximal benefit of hospitalization.  Discharge diagnosis, prognosis, plans, follow-up, medications and treatments discussed with the patient(or responsible party) and is in agreement with the plans as described.  Patient is stable for discharge.  Discharge Diagnoses/final diagnoses: Active Problems:   HCAP (healthcare-associated pneumonia)   Acute respiratory failure (HCC)   Aspiration into airway   Septic shock (HCC)   Shortness of breath   Pressure injury of skin   Pneumothorax   Pneumothorax on right   Terminal care   Acute respiratory failure with hypoxemia (HCC)   Palliative care by specialist   Community acquired pneumonia   At high risk for aspiration   Dysphagia   Other chronic pain   Anxiety state  #) Comfort care: Because of patient's multiple debilitating problems and extremely poor quality of life patient and his family have decided to transition to comfort care. -Palliative care appreciated -Continue fentanyl 75 mg every 3 days -Continue PRN IV  fentanyl and p.o. oral morphine for dyspnea -Continue glycopyrrolate for secretions -Continue haloperidol for agitation -Continue scheduled Valium for anxiety -  Patient now on Dilaudid drip Transfer to hospice house today   #) MRSA infection/pressure ulcers: Patient has had a long history of the severe pressure ulcers. He has transitioned completely to comfort care. He is currently off all antibiotics.  #)Pneumothorax: -We will remove chest tube today  #) Hyperlipidemia: Atorvastatin discontinued  #) Chronic back pain/psych: Patient has had multiple back surgeries. -Continue diazepam per above -Symptom management per above  Fluids: Tolerating p.o. Electrodes: We will stop checking labs Nutrition: Diet as tolerated  Discharge Instructions  Discharge Instructions    Diet general   Complete by:  As directed    Increase activity slowly   Complete by:  As directed      Allergies as of 04/02/2018      Reactions   Cephalexin Other (See Comments)   Paroxetine Hcl Other (See Comments)   SEDATION      Medication List    STOP taking these medications   aspirin 81 MG EC tablet   atorvastatin 40 MG tablet Commonly known as:  LIPITOR   cyclobenzaprine 10 MG tablet Commonly known as:  FLEXERIL   DESITIN 40 % ointment Generic drug:  liver oil-zinc oxide   diazepam 10 MG tablet Commonly known as:  VALIUM   feeding supplement (ENSURE ENLIVE) Liqd   feeding supplement (PRO-STAT SUGAR FREE 64) Liqd   gabapentin 100 MG capsule Commonly known as:  NEURONTIN   multivitamin with minerals Tabs tablet   Oxycodone HCl 10 MG Tabs     TAKE these medications   acetaminophen 650 MG suppository Commonly known as:  TYLENOL Place 1 suppository (650 mg total) rectally every  4 (four) hours as needed for fever.   antiseptic oral rinse Liqd Apply 15 mLs topically as needed for dry mouth.   docusate sodium 100 MG capsule Commonly known as:  COLACE Take 1 capsule (100  mg total) by mouth 2 (two) times daily.   fentaNYL 75 MCG/HR Commonly known as:  DURAGESIC - dosed mcg/hr Place 1 patch (75 mcg total) onto the skin every 3 (three) days. Start taking on:  04/04/2018   glycopyrrolate 1 MG tablet Commonly known as:  ROBINUL Take 1 tablet (1 mg total) by mouth every 4 (four) hours as needed (excessive secretions).   haloperidol 0.5 MG tablet Commonly known as:  HALDOL Take 1 tablet (0.5 mg total) by mouth every 4 (four) hours as needed for agitation (or delirium).   haloperidol lactate 5 MG/ML injection Commonly known as:  HALDOL Inject 0.1 mLs (0.5 mg total) into the vein every 4 (four) hours as needed (or delirium).   HYDROmorphone 2 mg/mL Soln Commonly known as:  DILAUDID Inject 0.3-0.5 mg into the vein every 30 (thirty) minutes as needed (SOB).   HYDROmorphone 25 mg in sodium chloride 0.9 % 47.5 mL Inject 0.25 mg/hr into the vein continuous.   LORazepam 2 MG/ML injection Commonly known as:  ATIVAN Inject 0.5 mLs (1 mg total) into the vein 3 (three) times daily.   LORazepam 1 MG tablet Commonly known as:  ATIVAN Take 1 tablet (1 mg total) by mouth every 4 (four) hours as needed for anxiety.   morphine CONCENTRATE 10 MG/0.5ML Soln concentrated solution Take 0.25 mLs (5 mg total) by mouth every 2 (two) hours as needed for moderate pain (or dyspnea).   morphine CONCENTRATE 10 MG/0.5ML Soln concentrated solution Place 0.25 mLs (5 mg total) under the tongue every 2 (two) hours as needed for moderate pain (or dyspnea).   mupirocin ointment 2 % Commonly known as:  BACTROBAN Place into the nose 2 (two) times daily.   polyethylene glycol packet Commonly known as:  MIRALAX / GLYCOLAX Take 17 g by mouth 2 (two) times daily.   polyvinyl alcohol 1.4 % ophthalmic solution Commonly known as:  LIQUIFILM TEARS Place 1 drop into both eyes 4 (four) times daily as needed for dry eyes.       Allergies  Allergen Reactions  . Cephalexin Other (See  Comments)  . Paroxetine Hcl Other (See Comments)    SEDATION    Consultations:  Palliative care   Procedures/Studies: Dg Chest 1 View  Result Date: 03/29/2018 CLINICAL DATA:  Shortness of Breath EXAM: CHEST  1 VIEW COMPARISON:  March 27, 2018 FINDINGS: Chest tubes on the right are unchanged in position. Feeding tube tip is below the diaphragm. No evident pneumothorax. There is subcutaneous air on the right inferiorly. There is airspace consolidation in portions of the right mid lower lung zones with loculated pleural effusion on the right. There is mild left base atelectasis. Left lung otherwise clear. Heart is slightly enlarged with pulmonary vascularity normal. No adenopathy. There is aortic atherosclerosis. There is postoperative change in the lower thoracic region. IMPRESSION: Tube positions as described without pneumothorax. There is soft tissue air laterally on the right inferiorly. There is patchy infiltrate in the right mid lower lung zones with fairly small loculated pleural effusion on the right, slightly increased from 2 days prior. Left lung is clear except for mild left base atelectasis. Stable cardiac silhouette. There is aortic atherosclerosis. Aortic Atherosclerosis (ICD10-I70.0). Electronically Signed   By: Bretta Bang III M.D.  On: 03/29/2018 07:40   Dg Chest Port 1 View  Result Date: 03/31/2018 CLINICAL DATA:  Follow-up right pneumothorax EXAM: PORTABLE CHEST 1 VIEW COMPARISON:  03/29/2018 FINDINGS: Right-sided pigtail catheter is again identified but somewhat uncoiled on the right. The basilar pigtail catheter on the right has been removed in the interval. No definitive pneumothorax is seen. Some mild pleural capping is noted on the right with a small right basilar effusion. Bibasilar atelectatic changes are seen. Cardiac shadow is stable. Postoperative changes are seen in the thoracic spine. Feeding catheter has been removed in the interval. IMPRESSION: Interval  removal of inferior right chest tube. No recurrent pneumothorax is seen although slight increase in right pleural effusion is noted. Bibasilar atelectatic changes remain. Interval removal of feeding catheter. Electronically Signed   By: Alcide Clever M.D.   On: 03/31/2018 08:21   Dg Chest Port 1 View  Result Date: 03/27/2018 CLINICAL DATA:  57 year old male status post bronchoscopy yesterday revealing bloody mucus plugging in both lungs. Recently status post chest tube placement for right pneumothorax. EXAM: PORTABLE CHEST 1 VIEW COMPARISON:  03/26/2018 and earlier. FINDINGS: Portable AP semi upright view at 0438 hours. Stable endotracheal tube tip about 16 millimeters above the carina. Enteric tube courses to the abdomen, tip not included today. Two right chest tubes are stable. No pneumothorax identified. Patchy in veiling bilateral middle and lower lung opacity has mildly improved since yesterday. Small pleural effusions suspected. Stable cardiac size and mediastinal contours. No pulmonary edema. Previous lower thoracic spine surgery. Stable visualized osseous structures. IMPRESSION: 1. Stable lines and tubes. No pneumothorax identified. 2. Mildly improved ventilation at both lung bases since yesterday with persistent patchy opacity and small pleural effusions. Electronically Signed   By: Odessa Fleming M.D.   On: 03/27/2018 08:24   Dg Chest Port 1 View  Result Date: 03/26/2018 CLINICAL DATA:  Respiratory failure EXAM: PORTABLE CHEST 1 VIEW COMPARISON:  03/26/2018 500 hours FINDINGS: Endotracheal tube and feeding tube are stable. Tip of the feeding tube is in the stomach. There are 2 right pigtail chest tubes in place. No pneumothorax. Bilateral airspace opacities are present worrisome for airspace disease. Shift of the mediastinum to the left compatible with collapse of the left lung has markedly improved. Normal heart size. Small pleural effusions are suspected. Postoperative changes in the lower thoracic  spine are stable. IMPRESSION: Left lung collapse has markedly improved. Bilateral airspace opacities and bilateral pleural effusions persist. No pneumothorax. Electronically Signed   By: Jolaine Click M.D.   On: 03/26/2018 18:03   Dg Chest Port 1 View  Result Date: 03/26/2018 CLINICAL DATA:  Acute respiratory failure with hypoxemia EXAM: PORTABLE CHEST 1 VIEW COMPARISON:  03/25/2018 FINDINGS: Pigtailed chest tube right apex and right base unchanged. No pneumothorax. Right lower lobe airspace disease has improved in the interval. Small right effusion Progressive opacification of the left lung due to collapse with mediastinal shift to the left. Feeding tube enters the stomach.  Tip not visualized. IMPRESSION: Two chest tubes on the right without pneumothorax. Improved aeration right lung base Progressive collapse left lower lobe and left upper lobe since yesterday. Electronically Signed   By: Marlan Palau M.D.   On: 03/26/2018 07:30   Dg Chest Port 1 View  Result Date: 03/25/2018 CLINICAL DATA:  Acute respiratory failure EXAM: PORTABLE CHEST 1 VIEW COMPARISON:  Yesterday FINDINGS: Chest tube at the right apex and base. Pneumothorax seen today. Airspace disease in the right perihilar and left basilar lung. Right perihilar opacity  has developed since yesterday. Small pleural effusions. Corpectomy changes again noted. IMPRESSION: 1. No visible pneumothorax. 2. Recently developed right perihilar airspace disease persists, concerning for site of pneumonia. Electronically Signed   By: Marnee Spring M.D.   On: 03/25/2018 07:15   Dg Chest Port 1 View  Result Date: 03/24/2018 CLINICAL DATA:  Insertion of second chest tube. EXAM: PORTABLE CHEST 1 VIEW COMPARISON:  03/24/2018 at 1348 hours FINDINGS: The second chest tube has been surgery. The pigtail lies at the right apex. The right pneumothorax has been near completely evacuated with only minimal right lateral pneumothorax persisting. There is hazy airspace  opacity in the right mid lung may reflect atelectasis or focal edema. No other change from the prior exams. IMPRESSION: Near complete re-expansion of right lung following placement of a second chest tube, with its tip at the right apex. Electronically Signed   By: Amie Portland M.D.   On: 03/24/2018 15:10   Dg Chest Port 1 View  Result Date: 03/24/2018 CLINICAL DATA:  Status post chest tube placement for right pneumothorax. EXAM: PORTABLE CHEST 1 VIEW COMPARISON:  03/24/2018 at 12:42 p.m. FINDINGS: Pigtail chest tube has been inserted. The tip of the tube is superimposed over the upper margin of the right hemidiaphragm. There has been a mild decrease in the size of the right pneumothorax. There is relative increased opacity in the right lower lung which is likely due to atelectasis. There is a small amount of subcutaneous emphysema along the right lateral chest wall. No other change from the earlier exam. IMPRESSION: Chest tube tip projects over the lower aspect of the right hemithorax. Mild decrease in the size of the right pneumothorax. Electronically Signed   By: Amie Portland M.D.   On: 03/24/2018 14:05   Dg Chest Port 1 View  Result Date: 03/24/2018 CLINICAL DATA:  Pneumothorax. EXAM: PORTABLE CHEST 1 VIEW COMPARISON:  03/24/2018 at 3:24 a.m. FINDINGS: Right pneumothorax has increased in size, now moderate, extending from the right apex across the right lateral pleural space. Pneumothorax is estimated at 30%. There is persistent opacity at the left lung base obscuring hemidiaphragm. This may reflect combination of pleural fluid and atelectasis. Consider pneumonia if there are consistent clinical findings. IMPRESSION: 1. Increase in size in the right pneumothorax compared to the earlier study, now estimated at 30%. 2. No other change. Electronically Signed   By: Amie Portland M.D.   On: 03/24/2018 13:22   Dg Chest Port 1 View  Result Date: 03/24/2018 CLINICAL DATA:  Patient with acute respiratory  failure. EXAM: PORTABLE CHEST 1 VIEW COMPARISON:  Chest radiograph 03/23/2018 FINDINGS: Monitoring leads overlie the patient. Stable cardiac and mediastinal contours. Small bilateral pleural effusions. Persistent mid and lower lung heterogeneous opacities. Slight interval increase in size of small right pneumothorax, particularly the apical component. IMPRESSION: Slight interval increase in size of small right pneumothorax, particularly the apical component. Similar-appearing mid and lower lung heterogeneous opacities which may represent combination of atelectasis, infection and/or edema. Small bilateral pleural effusions. These results will be called to the ordering clinician or representative by the Radiologist Assistant, and communication documented in the PACS or zVision Dashboard. Electronically Signed   By: Annia Belt M.D.   On: 03/24/2018 07:08   Dg Chest Port 1 View  Result Date: 03/23/2018 CLINICAL DATA:  Acute respiratory failure EXAM: PORTABLE CHEST 1 VIEW COMPARISON:  Chest radiograph 03/23/2018 FINDINGS: Monitoring leads overlie the patient. Stable cardiomegaly. Increased lucency projecting over the mediastinum. Similar-appearing lucency projecting over  the lateral right hemithorax compatible with pneumothorax. Small left pleural effusion. Bibasilar heterogeneous pulmonary opacities. Interval extubation. Interval removal enteric tube. IMPRESSION: There is increased lucency projecting over the mediastinum which may be secondary to enlarging right pneumothorax or potentially pneumomediastinum. Alternatively, this may be artifactual from patient rotation. Low lung volumes with unchanged bibasilar heterogeneous opacities. Small left pleural effusion. These results will be called to the ordering clinician or representative by the Radiologist Assistant, and communication documented in the PACS or zVision Dashboard. Electronically Signed   By: Annia Beltrew  Davis M.D.   On: 03/23/2018 17:40   Dg Chest Port 1  View  Addendum Date: 03/23/2018   ADDENDUM REPORT: 03/23/2018 07:54 ADDENDUM: Critical Value/emergent results were called by telephone at the time of interpretation on 03/23/2018 at 7:26 a.m. to Dr. Chilton GreathousePRAVEEN MANNAM , who verbally acknowledged these results. Electronically Signed   By: Elberta Fortisaniel  Boyle M.D.   On: 03/23/2018 07:54   Result Date: 03/23/2018 CLINICAL DATA:  Acute respiratory failure. EXAM: PORTABLE CHEST 1 VIEW COMPARISON:  03/22/2018 FINDINGS: Endotracheal tube has tip 2.9 cm above the carina. Enteric tube has tip over the stomach in the left upper quadrant. Lungs are adequately inflated with persistent hazy bibasilar opacification right worse than left likely cysts layering effusions with atelectasis. Infection in the lung bases is possible. Mild prominence of the perihilar markings unchanged likely mild degree of vascular congestion. There is a small right-sided pneumothorax slightly worse. Cardiomediastinal silhouette and remainder of the exam is unchanged. IMPRESSION: Small right-sided pneumothorax slightly worse. Hazy opacification over the mid to lower lungs right worse than left likely small effusions with associated basilar atelectasis. Infection in the lung bases is possible. Suggestion mild vascular congestion unchanged. Tubes and lines as described. Electronically Signed: By: Elberta Fortisaniel  Boyle M.D. On: 03/23/2018 07:16   Dg Chest Port 1 View  Result Date: 03/22/2018 CLINICAL DATA:  Endotracheal and OG tube placements EXAM: PORTABLE CHEST 1 VIEW COMPARISON:  03/22/2018 FINDINGS: Endotracheal tube placed with tip measuring 5.6 cm above the carina. Enteric tube placed with tip in the left upper quadrant consistent with location in the upper stomach. Heart size and pulmonary vascularity are normal. Infiltration or atelectasis in both lung bases with small bilateral pleural effusions. No pneumothorax. Calcification of the aorta. IMPRESSION: Appliances appear in satisfactory location. Infiltration  or atelectasis in both lung bases with small bilateral pleural effusions. Electronically Signed   By: Burman NievesWilliam  Stevens M.D.   On: 03/22/2018 03:21   Dg Chest Port 1 View  Result Date: 03/22/2018 CLINICAL DATA:  Aspiration. Shortness of breath. EXAM: PORTABLE CHEST 1 VIEW COMPARISON:  03/20/2018 FINDINGS: Shallow inspiration. Heart size and pulmonary vascularity are normal for technique. Bilateral lung base opacities likely represent a combination of pneumonia, atelectasis, and layering pleural effusions. No pneumothorax. Appearance of mild progression since previous study. Calcification of the aorta. Postoperative changes in the thoracic spine. IMPRESSION: Mild progression of bilateral basilar infiltration, atelectasis, and effusions. Electronically Signed   By: Burman NievesWilliam  Stevens M.D.   On: 03/22/2018 01:50   Dg Chest Portable 1 View  Result Date: 03/20/2018 CLINICAL DATA:  Shortness of breath today. EXAM: PORTABLE CHEST 1 VIEW COMPARISON:  CT chest 03/11/2016. Single-view of the chest 01/05/2018 and 12/31/2017. FINDINGS: There is new airspace disease in the right lung base. Left lung is clear. Heart size is normal. No pneumothorax or pleural effusion. Aortic atherosclerosis is noted. Postoperative change of lower thoracic corpectomy and fusion noted. IMPRESSION: Right basilar airspace disease most consistent with pneumonia. Atherosclerosis.  Electronically Signed   By: Drusilla Kanner M.D.   On: 03/20/2018 13:18   Dg Abd Portable 1v  Result Date: 03/25/2018 CLINICAL DATA:  Assess feeding tube positioning EXAM: PORTABLE ABDOMEN - 1 VIEW COMPARISON:  None. FINDINGS: A feeding tube is in place with the tip appearing to lie in the proximal gastric body. There is increased density in the retrocardiac region on the left. A pigtail catheter lies at the base of the right pleural space. IMPRESSION: The feeding tube tip appears to lie in the proximal gastric body. Electronically Signed   By: Jayven  [place] M.D.    On: 03/25/2018 12:54       Subjective: Patient much less agitated today.  Able to follow commands.  Thinking remains clouded and speech is non-nonsensical  Discharge Exam: Vitals:   04/02/18 0500 04/02/18 0525  BP:  (!) 73/62  Pulse:  84  Resp:  16  Temp:  98 F (36.7 C)  SpO2: (!) 69% (!) 69%   Vitals:   04/01/18 0423 04/01/18 1720 04/02/18 0500 04/02/18 0525  BP:  (!) 81/63  (!) 73/62  Pulse:  (!) 128  84  Resp: 17   16  Temp:  98.7 F (37.1 C)  98 F (36.7 C)  TempSrc:  Oral  Oral  SpO2: (!) 64% (!) 78% (!) 69% (!) 69%  Weight:      Height:        General: Pt is alert, awake, not in acute distress Cardiovascular: RRR, S1/S2 +, no rubs, no gallops Respiratory: CTA bilaterally, no wheezing, no rhonchi Abdominal: Soft, NT, ND, bowel sounds + Extremities: no edema, no cyanosis    The results of significant diagnostics from this hospitalization (including imaging, microbiology, ancillary and laboratory) are listed below for reference.     Microbiology: Recent Results (from the past 240 hour(s))  Culture, respiratory (non-expectorated)     Status: None   Collection Time: 03/26/18  5:28 PM  Result Value Ref Range Status   Specimen Description BRONCHIAL WASHINGS  Final   Special Requests NONE  Final   Gram Stain   Final    ABUNDANT WBC PRESENT, PREDOMINANTLY PMN FEW GRAM POSITIVE COCCI Performed at [facility], [address], [place], [place] [address]    Culture FEW MULTIPLE ORGANISMS PRESENT, NONE PREDOMINANT  Final   Report Status 03/29/2018 FINAL  Final     Labs: BNP (last 3 results) Recent Labs    03/20/18 1302  BNP 173.8*   Basic Metabolic Panel: Recent Labs  Lab 03/26/18 1611 03/27/18 0700 03/28/18 0628 03/29/18 0253  NA 146* 147* 144 141  K 3.3* 3.2* 2.6* 4.0  CL 114* 119* 112* 113*  CO2 30 25 24 23   GLUCOSE 125* 121* 103* 206*  BUN 10 16 11 9   CREATININE 0.52* 0.56* 0.67 0.52*  CALCIUM 7.7* 6.8* 7.3* 7.0*  MG 1.9 1.7 1.7   --   PHOS 1.4* 1.2* 2.0* 1.5*   Liver Function Tests: Recent Labs  Lab 03/29/18 0253  AST 31  ALT 40  ALKPHOS 48  BILITOT 0.8  PROT 4.9*  ALBUMIN 1.5*   No results for input(s): LIPASE, AMYLASE in the last 168 hours. No results for input(s): AMMONIA in the last 168 hours. CBC: Recent Labs  Lab 03/27/18 0700 03/28/18 0628 03/29/18 0253  WBC 15.7* 22.1* 19.2*  NEUTROABS  --   --  16.7*  HGB 8.0* 9.7* 8.4*  HCT 25.8* 31.5* 27.9*  MCV 90.8 89.7 88.6  PLT 97* 198 252   Cardiac Enzymes: No results for input(s): CKTOTAL, CKMB, CKMBINDEX, TROPONINI in the last 168 hours. BNP: Invalid input(s): POCBNP CBG: Recent Labs  Lab 03/28/18 1530 03/28/18 1928 03/28/18 2346 03/29/18 0348 03/29/18 0730  GLUCAP 94 90 96 93 118*   D-Dimer No results for input(s): DDIMER in the last 72 hours. Hgb A1c No results for input(s): HGBA1C in the last 72 hours. Lipid Profile No results for input(s): CHOL, HDL, LDLCALC, TRIG, CHOLHDL, LDLDIRECT in the last 72 hours. Thyroid function studies No results for input(s): TSH, T4TOTAL, T3FREE, THYROIDAB in the last 72 hours.  Invalid input(s): FREET3 Anemia work up No results for input(s): VITAMINB12, FOLATE, FERRITIN, TIBC, IRON, RETICCTPCT in the last 72 hours. Urinalysis No results found for: COLORURINE, APPEARANCEUR, LABSPEC, PHURINE, GLUCOSEU, HGBUR, BILIRUBINUR, KETONESUR, PROTEINUR, UROBILINOGEN, NITRITE, LEUKOCYTESUR Sepsis Labs Invalid input(s): PROCALCITONIN,  WBC,  LACTICIDVEN Microbiology Recent Results (from the past 240 hour(s))  Culture, respiratory (non-expectorated)     Status: None   Collection Time: 03/26/18  5:28 PM  Result Value Ref Range Status   Specimen Description BRONCHIAL WASHINGS  Final   Special Requests NONE  Final   Gram Stain   Final    ABUNDANT WBC PRESENT, PREDOMINANTLY PMN FEW GRAM POSITIVE COCCI Performed at [facility], [address] [place], [place] [address]    Culture FEW MULTIPLE  ORGANISMS PRESENT, NONE PREDOMINANT  Final   Report Status 03/29/2018 FINAL  Final     Time coordinating discharge: 38 minutes  SIGNED:   Lahoma Crocker, MD  FACP [facility] 04/02/2018, 1:32 PM Pager   If 7PM-7AM, please contact night-coverage [email or web address] Password [email or web address]

## 2018-04-14 DEATH — deceased

## 2018-05-02 ENCOUNTER — Ambulatory Visit (INDEPENDENT_AMBULATORY_CARE_PROVIDER_SITE_OTHER): Payer: Medicare Other | Admitting: Orthopedic Surgery

## 2019-02-04 IMAGING — DX DG CHEST 1V PORT
1 series · 1 of 1 positions shown · non-contrast
Comparison: 03/24/2018 at [DATE] p.m.

CLINICAL DATA: Status post chest tube placement for right
pneumothorax.

EXAM:
PORTABLE CHEST 1 VIEW

[chest ap]
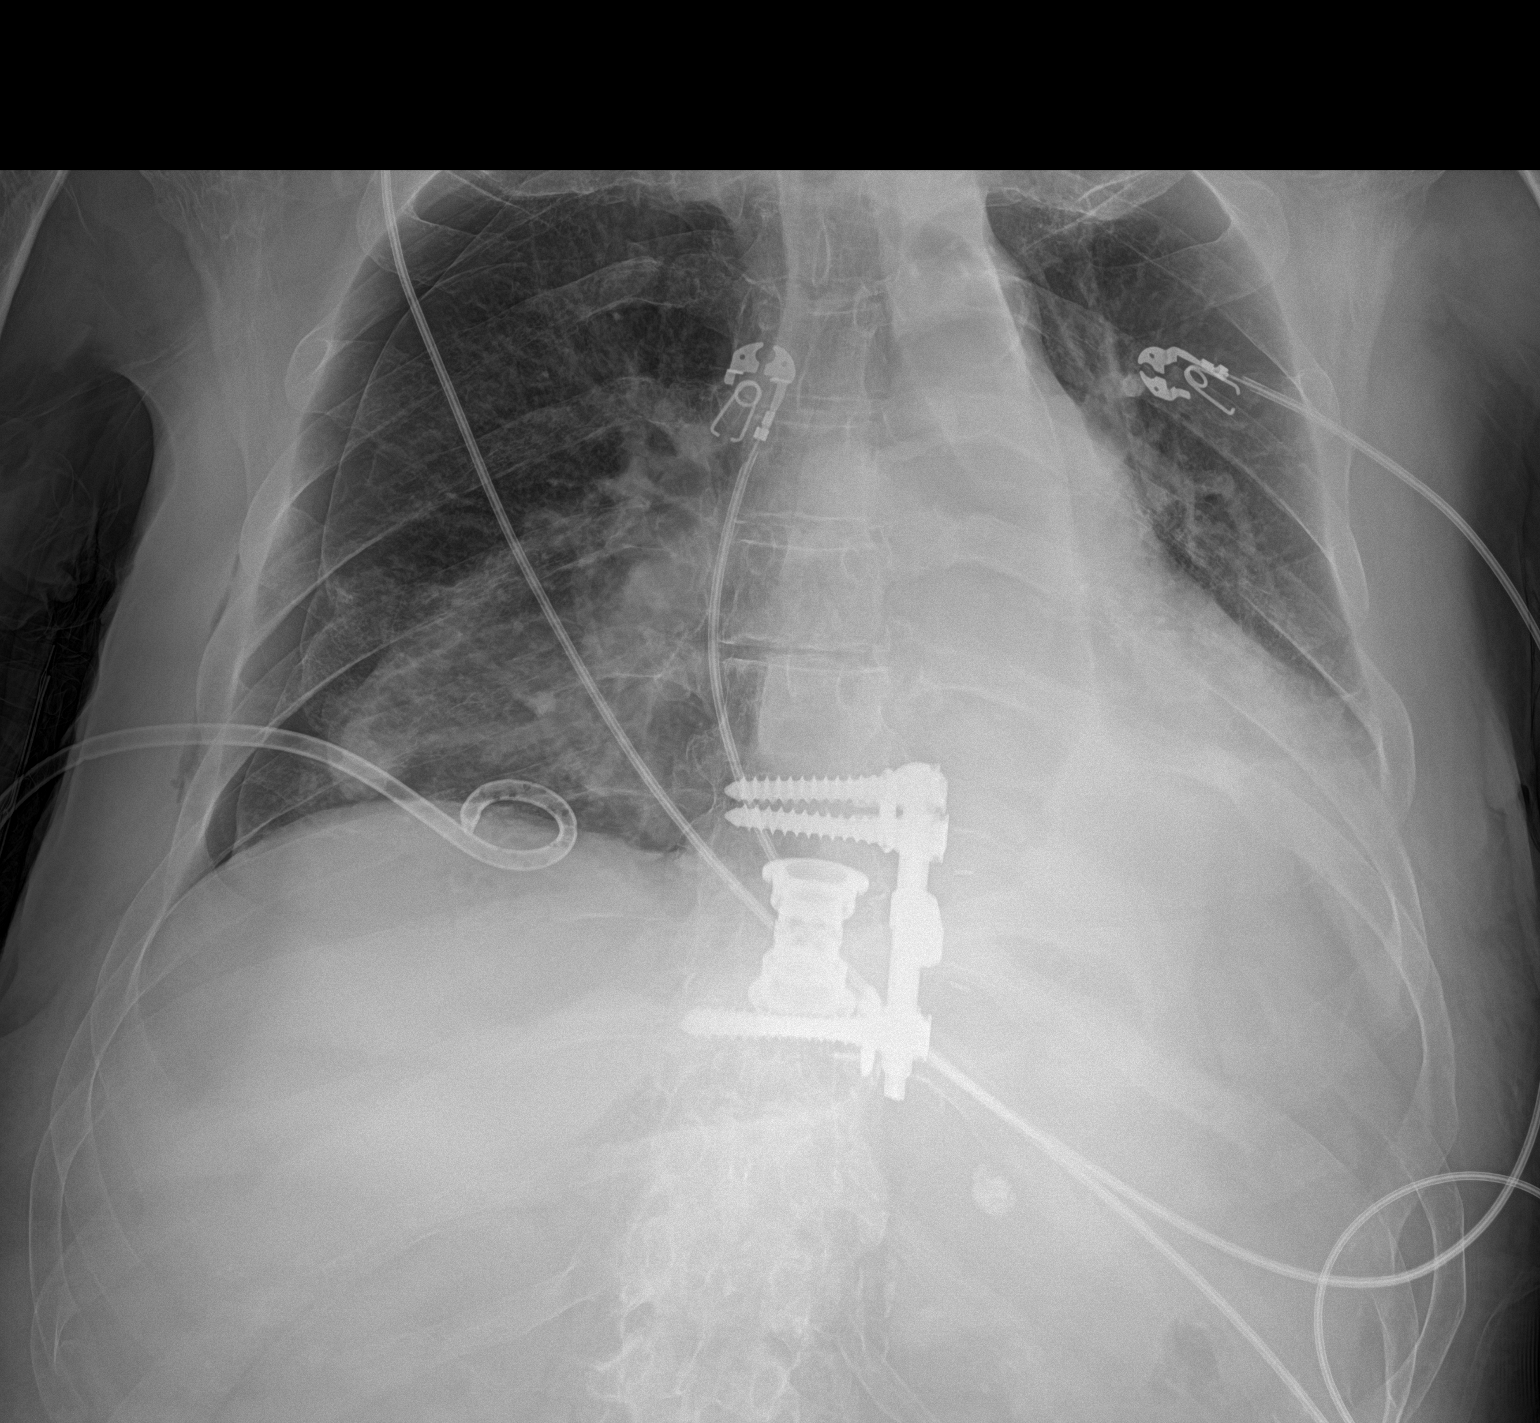

[1 of 1 positions shown; findings below may reference images not displayed]

FINDINGS: Pigtail chest tube has been inserted. The tip of the tube is
superimposed over the upper margin of the right hemidiaphragm. There
has been a mild decrease in the size of the right pneumothorax.
There is relative increased opacity in the right lower lung which is
likely due to atelectasis. There is a small amount of subcutaneous
emphysema along the right lateral chest wall.

No other change from the earlier exam.
IMPRESSION: Chest tube tip projects over the lower aspect of the right
hemithorax. Mild decrease in the size of the right pneumothorax.

## 2019-02-07 IMAGING — DX DG CHEST 1V PORT
1 series · 1 of 1 positions shown · non-contrast
Comparison: 03/26/2018 and earlier.

CLINICAL DATA: 57-year-old male status post bronchoscopy yesterday
revealing bloody mucus plugging in both lungs. Recently status post
chest tube placement for right pneumothorax.

EXAM:
PORTABLE CHEST 1 VIEW

[chest ap]
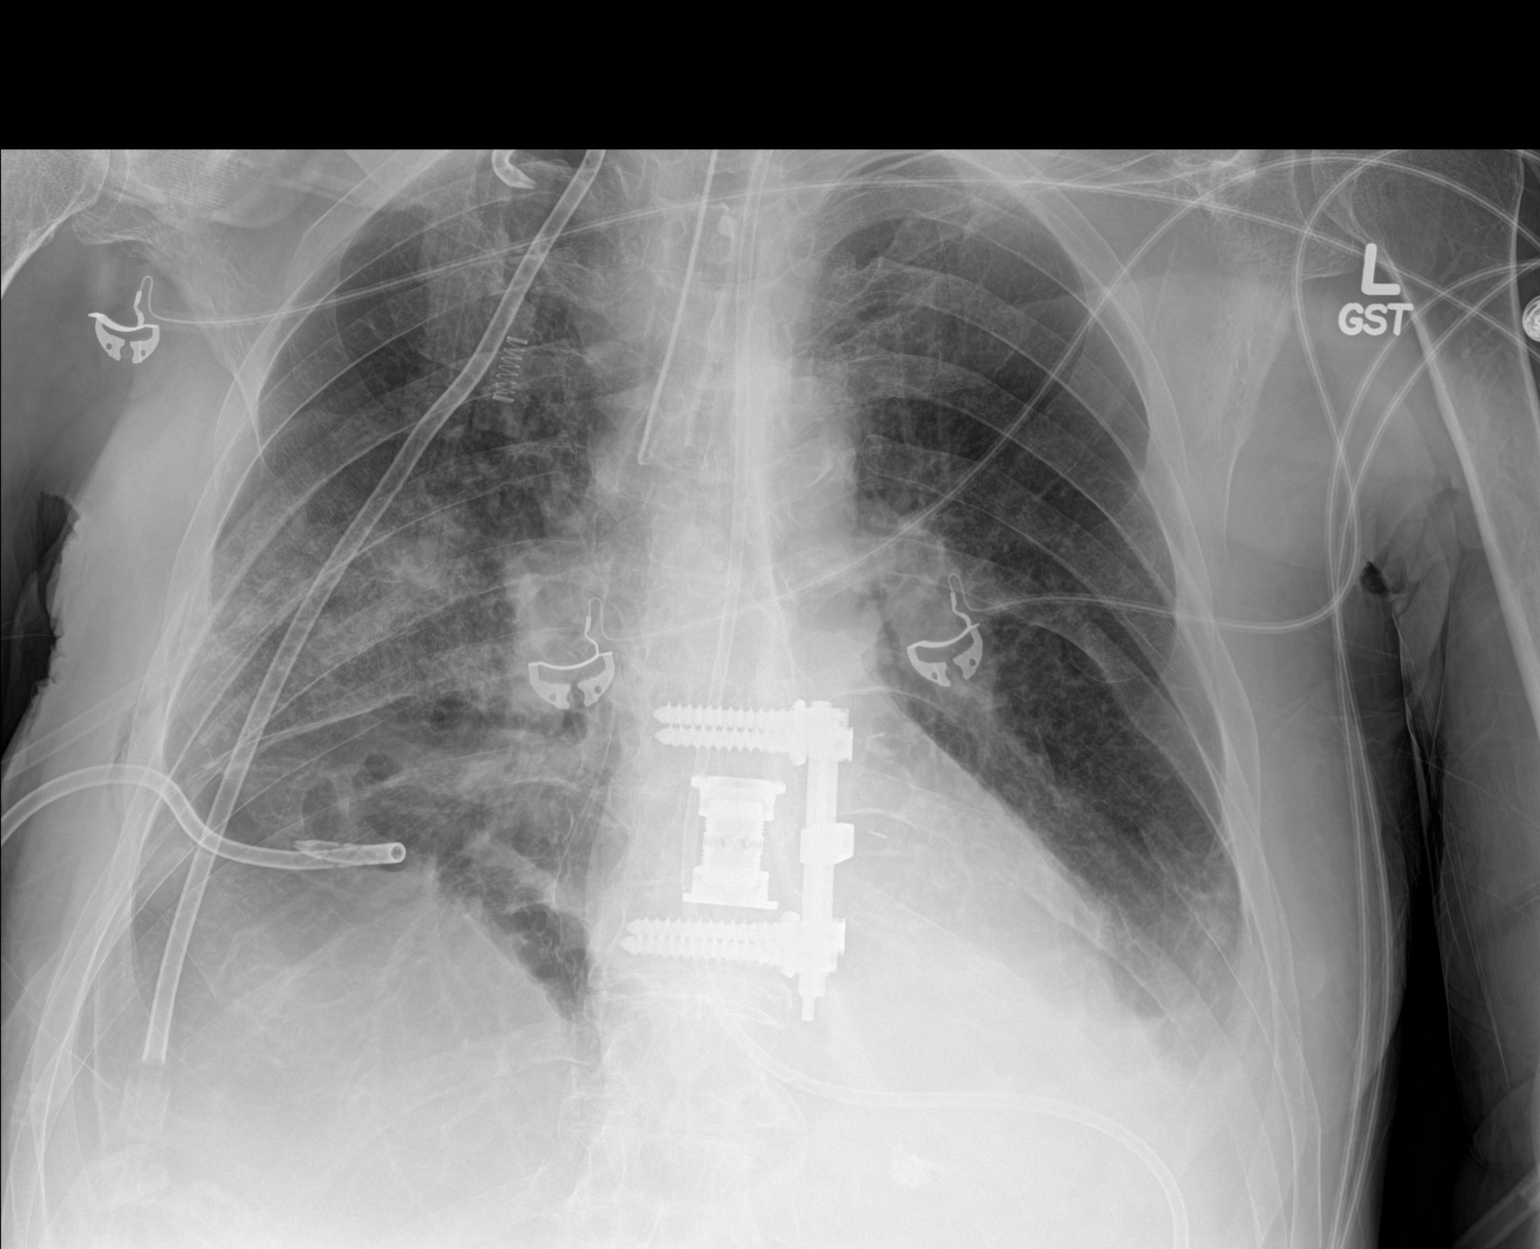

[1 of 1 positions shown; findings below may reference images not displayed]

FINDINGS: Portable AP semi upright view at 2286 hours.

Stable endotracheal tube tip about 16 millimeters above the carina.
Enteric tube courses to the abdomen, tip not included today. Two
right chest tubes are stable.

No pneumothorax identified. Patchy in veiling bilateral middle and
lower lung opacity has mildly improved since yesterday. Small
pleural effusions suspected. Stable cardiac size and mediastinal
contours. No pulmonary edema. Previous lower thoracic spine surgery.
Stable visualized osseous structures.
IMPRESSION: 1. Stable lines and tubes. No pneumothorax identified.
2. Mildly improved ventilation at both lung bases since yesterday
with persistent patchy opacity and small pleural effusions.

## 2020-05-02 ENCOUNTER — Inpatient Hospital Stay
Admission: EM | Admit: 2020-05-02 | Discharge: 2020-05-04 | DRG: 065 | Disposition: A | Discharge status: Home / Self Care | Payer: Self-pay | Attending: Internal Medicine | Admitting: Internal Medicine

## 2020-05-02 DIAGNOSIS — Z9114 Patient's other noncompliance with medication regimen: Secondary | ICD-10-CM

## 2020-05-02 DIAGNOSIS — E1165 Type 2 diabetes mellitus with hyperglycemia: Secondary | ICD-10-CM | POA: Diagnosis present

## 2020-05-02 DIAGNOSIS — R197 Diarrhea, unspecified: Secondary | ICD-10-CM

## 2020-05-02 DIAGNOSIS — G8191 Hemiplegia, unspecified affecting right dominant side: Secondary | ICD-10-CM | POA: Diagnosis present

## 2020-05-02 DIAGNOSIS — Z9049 Acquired absence of other specified parts of digestive tract: Secondary | ICD-10-CM

## 2020-05-02 DIAGNOSIS — I6381 Other cerebral infarction due to occlusion or stenosis of small artery: Principal | ICD-10-CM | POA: Diagnosis present

## 2020-05-02 DIAGNOSIS — R4701 Aphasia: Secondary | ICD-10-CM | POA: Diagnosis present

## 2020-05-02 DIAGNOSIS — I639 Cerebral infarction, unspecified: Secondary | ICD-10-CM | POA: Diagnosis present

## 2020-05-02 DIAGNOSIS — H538 Other visual disturbances: Secondary | ICD-10-CM | POA: Diagnosis present

## 2020-05-02 DIAGNOSIS — I629 Nontraumatic intracranial hemorrhage, unspecified: Secondary | ICD-10-CM | POA: Diagnosis present

## 2020-05-02 DIAGNOSIS — E119 Type 2 diabetes mellitus without complications: Secondary | ICD-10-CM

## 2020-05-02 NOTE — ED Triage Notes (Signed)
Pt to ed, presents with complaints of right upper and lower extremity weakness since 12/5. Girlfriend at bedside states that patient has been confused since then and has not been as active as usual. Hx of DM but does not take medications. On arrival pt alert. Disoriented to birthday, age, month.

## 2020-05-02 NOTE — ED Notes (Signed)
MRI called for patient.  
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
## 2020-05-03 ENCOUNTER — Encounter: Payer: Self-pay | Admitting: Family Medicine

## 2020-05-03 ENCOUNTER — Emergency Department: Payer: Self-pay

## 2020-05-03 ENCOUNTER — Inpatient Hospital Stay: Payer: Self-pay

## 2020-05-03 DIAGNOSIS — I639 Cerebral infarction, unspecified: Secondary | ICD-10-CM | POA: Diagnosis present

## 2020-05-03 LAB — COMPREHENSIVE METABOLIC PANEL
ALT: 11 U/L (ref 0–55)
AST (SGOT): 13 U/L (ref 10–42)
Albumin/Globulin Ratio: 1.11 Ratio (ref 0.80–2.00)
Albumin: 4.1 gm/dL (ref 3.5–5.0)
Alkaline Phosphatase: 108 U/L (ref 40–145)
Anion Gap: 18.7 mMol/L — ABNORMAL HIGH (ref 7.0–18.0)
BUN / Creatinine Ratio: 9.9 Ratio — ABNORMAL LOW (ref 10.0–30.0)
BUN: 10 mg/dL (ref 7–22)
Bilirubin, Total: 0.7 mg/dL (ref 0.1–1.2)
CO2: 23 mMol/L (ref 20–30)
Calcium: 10 mg/dL (ref 8.5–10.5)
Chloride: 98 mMol/L (ref 98–110)
Creatinine: 1.01 mg/dL (ref 0.80–1.30)
EGFR: 82 mL/min/{1.73_m2} (ref 60–150)
Globulin: 3.7 gm/dL (ref 2.0–4.0)
Glucose: 373 mg/dL — ABNORMAL HIGH (ref 71–99)
Osmolality Calculated: 286 mOsm/kg (ref 275–300)
Potassium: 3.7 mMol/L (ref 3.5–5.3)
Protein, Total: 7.8 gm/dL (ref 6.0–8.3)
Sodium: 136 mMol/L (ref 136–147)

## 2020-05-03 LAB — VH DEXTROSE STICK GLUCOSE
Glucose POCT: 300 mg/dL — ABNORMAL HIGH (ref 71–99)
Glucose POCT: 311 mg/dL — ABNORMAL HIGH (ref 71–99)
Glucose POCT: 341 mg/dL — ABNORMAL HIGH (ref 71–99)
Glucose POCT: 277 mg/dL — ABNORMAL HIGH (ref 71–99)
Glucose POCT: 253 mg/dL — ABNORMAL HIGH (ref 71–99)
Glucose POCT: 301 mg/dL — ABNORMAL HIGH (ref 71–99)

## 2020-05-03 LAB — ECG 12-LEAD
P Wave Axis: 69 deg
P-R Interval: 130 ms
Patient Age: 59 years
Q-T Interval(Corrected): 470 ms
Q-T Interval: 330 ms
QRS Axis: 67 deg
QRS Duration: 91 ms
T Axis: 53 years
Ventricular Rate: 122 //min

## 2020-05-03 LAB — CBC AND DIFFERENTIAL
Basophils %: 1.1 % (ref 0.0–3.0)
Basophils Absolute: 0.1 10*3/uL (ref 0.0–0.3)
Eosinophils %: 3.2 % (ref 0.0–7.0)
Eosinophils Absolute: 0.3 10*3/uL (ref 0.0–0.8)
Hematocrit: 38 % — ABNORMAL LOW (ref 39.0–52.5)
Hemoglobin: 14.1 gm/dL (ref 13.0–17.5)
Lymphocytes Absolute: 2.5 10*3/uL (ref 0.6–5.1)
Lymphocytes: 31.4 % (ref 15.0–46.0)
MCH: 30 pg (ref 28–35)
MCHC: 37 gm/dL — ABNORMAL HIGH (ref 32–36)
MCV: 81 fL (ref 80–100)
MPV: 7.3 fL (ref 6.0–10.0)
Monocytes Absolute: 0.6 10*3/uL (ref 0.1–1.7)
Monocytes: 7.1 % (ref 3.0–15.0)
Neutrophils %: 57.2 % (ref 42.0–78.0)
Neutrophils Absolute: 4.6 10*3/uL (ref 1.7–8.6)
PLT CT: 354 10*3/uL (ref 130–440)
RBC: 4.67 10*6/uL (ref 4.00–5.70)
RDW: 11.5 % (ref 11.0–14.0)
WBC: 8.1 10*3/uL (ref 4.0–11.0)

## 2020-05-03 LAB — THYROID STIMULATING HORMONE (TSH), REFLEX ON ABNORMAL TO FREE T4, SERUM: TSH: 1.63 u[IU]/mL (ref 0.40–4.20)

## 2020-05-03 LAB — VITAMIN B12 AND FOLATE
Folate: 13.3 ng/mL (ref 7.0–19.9)
Vitamin B-12: 320 pg/mL (ref 213–816)

## 2020-05-03 LAB — ETHANOL: Alcohol: <10 mg/dL (ref 0–9)

## 2020-05-03 LAB — LIPID PANEL
Cholesterol: 257 mg/dL — ABNORMAL HIGH (ref 75–199)
Coronary Heart Disease Risk: 5.71
HDL: 45 mg/dL (ref 40–55)
LDL Calculated: 170 mg/dL
Triglycerides: 209 mg/dL — ABNORMAL HIGH (ref 10–150)
VLDL: 42 — ABNORMAL HIGH (ref 0–40)

## 2020-05-03 LAB — HEMOGLOBIN A1C: Hgb A1C, %: 12.6 %

## 2020-05-03 MED ORDER — AMLODIPINE BESYLATE 5 MG PO TABS
ORAL_TABLET | ORAL | Status: AC
Start: 2020-05-03 — End: ?
  Filled 2020-05-03: qty 1

## 2020-05-03 MED ORDER — METOPROLOL TARTRATE 5 MG/5ML IV SOLN
5.0000 mg | Freq: Four times a day (QID) | INTRAVENOUS | Status: DC | PRN
Start: 2020-05-03 — End: 2020-05-04
  Filled 2020-05-03: qty 5

## 2020-05-03 MED ORDER — GLUCAGON 1 MG IJ SOLR (WRAP)
1.0000 mg | INTRAMUSCULAR | Status: DC | PRN
Start: 2020-05-03 — End: 2020-05-04

## 2020-05-03 MED ORDER — ACETAMINOPHEN 160 MG/5ML PO SOLN
650.0000 mg | ORAL | Status: DC | PRN
Start: 2020-05-03 — End: 2020-05-04

## 2020-05-03 MED ORDER — SODIUM CHLORIDE (PF) 0.9 % IJ SOLN
0.4000 mg | INTRAMUSCULAR | Status: DC | PRN
Start: 2020-05-03 — End: 2020-05-04

## 2020-05-03 MED ORDER — INSULIN LISPRO (1 UNIT DIAL) 100 UNIT/ML SC SOPN
1.0000 [IU] | PEN_INJECTOR | Freq: Every evening | SUBCUTANEOUS | Status: DC
Start: 2020-05-03 — End: 2020-05-04
  Administered 2020-05-03 (×2): 2 [IU] via SUBCUTANEOUS

## 2020-05-03 MED ORDER — DEXTROSE 10 % IV BOLUS
125.0000 mL | INTRAVENOUS | Status: DC | PRN
Start: 2020-05-03 — End: 2020-05-04

## 2020-05-03 MED ORDER — INSULIN LISPRO (1 UNIT DIAL) 100 UNIT/ML SC SOPN
1.0000 [IU] | PEN_INJECTOR | Freq: Every day | SUBCUTANEOUS | Status: DC | PRN
Start: 2020-05-03 — End: 2020-05-04
  Administered 2020-05-03 – 2020-05-04 (×2): 2 [IU] via SUBCUTANEOUS
  Filled 2020-05-03: qty 3

## 2020-05-03 MED ORDER — INSULIN LISPRO (1 UNIT DIAL) 100 UNIT/ML SC SOPN
PEN_INJECTOR | SUBCUTANEOUS | Status: AC
Start: 2020-05-03 — End: ?
  Filled 2020-05-03: qty 3

## 2020-05-03 MED ORDER — INSULIN LISPRO (1 UNIT DIAL) 100 UNIT/ML SC SOPN
1.0000 [IU] | PEN_INJECTOR | Freq: Three times a day (TID) | SUBCUTANEOUS | Status: DC
Start: 2020-05-03 — End: 2020-05-04
  Administered 2020-05-03: 19:00:00 3 [IU] via SUBCUTANEOUS
  Administered 2020-05-03 – 2020-05-04 (×2): 4 [IU] via SUBCUTANEOUS
  Administered 2020-05-04: 09:00:00 3 [IU] via SUBCUTANEOUS

## 2020-05-03 MED ORDER — ACETAMINOPHEN 325 MG PO TABS
650.0000 mg | ORAL_TABLET | ORAL | Status: DC | PRN
Start: 2020-05-03 — End: 2020-05-04
  Administered 2020-05-03 – 2020-05-04 (×3): 650 mg via ORAL
  Filled 2020-05-03 (×3): qty 2

## 2020-05-03 MED ORDER — SODIUM CHLORIDE (PF) 0.9 % IJ SOLN
3.0000 mL | Freq: Two times a day (BID) | INTRAMUSCULAR | Status: DC
Start: 2020-05-03 — End: 2020-05-04
  Administered 2020-05-03 – 2020-05-04 (×4): 3 mL via INTRAVENOUS

## 2020-05-03 MED ORDER — IOHEXOL 350 MG/ML IV SOLN
60.0000 mL | Freq: Once | INTRAVENOUS | Status: AC | PRN
Start: 2020-05-03 — End: 2020-05-03
  Administered 2020-05-03: 11:00:00 60 mL via INTRAVENOUS

## 2020-05-03 MED ORDER — AMLODIPINE BESYLATE 5 MG PO TABS
5.0000 mg | ORAL_TABLET | Freq: Every day | ORAL | Status: DC
Start: 2020-05-03 — End: 2020-05-04
  Administered 2020-05-03 – 2020-05-04 (×2): 5 mg via ORAL
  Filled 2020-05-03 (×2): qty 1

## 2020-05-03 MED ORDER — ACETAMINOPHEN 650 MG RE SUPP
650.0000 mg | RECTAL | Status: DC | PRN
Start: 2020-05-03 — End: 2020-05-04

## 2020-05-03 MED ORDER — ATORVASTATIN CALCIUM 40 MG PO TABS
80.0000 mg | ORAL_TABLET | Freq: Every evening | ORAL | Status: DC
Start: 2020-05-03 — End: 2020-05-04
  Administered 2020-05-03: 22:00:00 80 mg via ORAL
  Filled 2020-05-03: qty 2

## 2020-05-03 MED ORDER — ASPIRIN 81 MG PO CHEW
81.0000 mg | CHEWABLE_TABLET | Freq: Every day | ORAL | Status: DC
Start: 2020-05-03 — End: 2020-05-04
  Administered 2020-05-04: 09:00:00 81 mg via ORAL
  Filled 2020-05-03 (×2): qty 1

## 2020-05-03 NOTE — Progress Notes (Addendum)
NURSE NOTE SUMMARY  [facility] EMERGENCY DEPARTMENT   Patient Name: Walter West   Attending Physician: Marvene Staff, MD   Today's date:   05/03/2020 LOS: 0 days   Shift Summary:                                                                0300: assumed care of patient, patient is a/o x2 (does not know name, DOB, year) but he knows he is at the hospital. Patient is on room air, admitted on tele, patient was a self in the room. Weight was taken, orders looked over. Patient is to remain NPO until speech and swallow eval in the AM.    0400: patients BG was 311, insulin was given. Patient has been hypertensive, amlodipine was given in the ED.      0510: patient has had consistently high blood pressures. Talked to charge nurse as well as tele med and got an order for PRN lopressor for BP over 160.  0620: when BP was rechecked, it was 112/74 so did not give PRN lopressor   Provider Notifications:        Rapid Response Notifications:  Mobility:          Weight tracking:  Family Dynamic:   Last 3 Weights for the past 72 hrs (Last 3 readings):   Weight   05/03/20 0306 65.2 kg (143 lb 11.8 oz)   05/02/20 2311 66.3 kg (146 lb 3.2 oz)             Last Bowel Movement   No data recorded

## 2020-05-03 NOTE — Plan of Care (Signed)
Problem: Compromised Tissue integrity  Goal: Nutritional status is improving  Outcome: Progressing  Flowsheets (Taken 05/03/2020 0352)  Nutritional status is improving:   Allow adequate time for meals   Encourage patient to take dietary supplement(s) as ordered   Collaborate with Clinical Nutritionist   Include patient/patient care companion in decisions related to nutrition

## 2020-05-03 NOTE — Progress Notes (Signed)
Telemedicine cross cover:  BP elevated : pt has a sub acute CVA with hemorrhagic component    - HR elevated     - add prn IV lopressor for SBP > 160

## 2020-05-03 NOTE — Progress Notes (Signed)
Quick Doc  [facility] - SURG TELEMETRY STEP-DOWN   Patient Name: Walter West   Attending Physician: Jonn Shingles, MD   Today's date:   05/03/2020 LOS: 0 days   Expected Discharge Date      Quick  Assessment:                                                              ReAdmit Risk Score: 7    CM Comments: (P) 12/20 RNCM SM: Adm wtih L CVA with hemorrhagic component. Listed as self pay, no PCP documented. Attempted to complete IDPA, pt states he did not want to answer any questions as this time. Stated he has been up in his room and is moving fine and ready to go home. CM will attempt to follow up tomorrow if pt is still admitted and agreeable talk with CM.                                                                                       Provider Notifications:        Massie Bougie RN BSN [facility]  Case Manager  [phone]

## 2020-05-03 NOTE — Progress Notes (Addendum)
NURSE NOTE SUMMARY  [facility] - SURG TELEMETRY STEP-DOWN   Patient Name: Walter West   Attending Physician: Jonn Shingles, MD   Today's date:   05/03/2020 LOS: 0 days   Shift Summary:                                                                1900: assumed care of patient, patient resting in bed, patient is on room air, tele on, self in the room. Patient was educated on new medications that were added to the [facility]. He took his medications and had prn tylenol for a headache.    Provider Notifications:        Rapid Response Notifications:  Mobility:      PMP Activity: Step 6 - Walks in Room (05/03/2020  3:00 AM)     Weight tracking:  Family Dynamic:   Last 3 Weights for the past 72 hrs (Last 3 readings):   Weight   05/03/20 0306 65.2 kg (143 lb 11.8 oz)   05/02/20 2311 66.3 kg (146 lb 3.2 oz)             Last Bowel Movement   Last BM Date: 05/01/20 (Pt stated.)

## 2020-05-03 NOTE — ED Notes (Signed)
[facility] EMERGENCY DEPARTMENT  ED NURSING NOTE FOR THE RECEIVING INPATIENT NURSE   ED NURSE PHONE: [phone]    ADMISSION INFORMATION   Walter West is a 59 y.o. male admitted with an ED diagnosis of:  1. Cerebral infarction, unspecified mechanism      ED Pre-Departure Assessment:  Pt to ed, c/o approx. 2 week history of confusion and right sided weakness. On arrival, pt disoriented to time, DOB, and age. pts extremities were strong bilaterally. MRI showed left sided infarct.    NURSING CARE   Isolation None   Home O2 No   Patient Comes From: Home Independent   Mental Status: confused   Ambulation: 1 person assist   Pertinent Info/Safety Concern: None   Report called to receiving RN?: Yes, acute neuro condition

## 2020-05-03 NOTE — Progress Notes (Signed)
Nutrition Therapy  Nutrition Assessment    Patient Information:     Name:Walter West   Age: 59 y.o.   Sex: male     MRN: [id number]    Recommendation:     1. Diet per SLP  2. Encourage PO intake >75%    Nutrition History:     59 yo male adm w/ stroke. Hx including DM w/ A1c of 12.6% on 12/19. Poor wt hx w/ 2 years gap noted per chart. No PO intake to trend at this time. Followed by SLP, noted SLP eval attempt this morning, but pt was off floor for CT.   Reported good appetite and intake without changes compared to pta. Denied any GI issues. Refused any ONS at this time, would like to work w/ food first.    Nutrition Focus Physical Findings: appears nourished    Nutrition Risk Level: High, moderate if diet is advanced    Nutrition Diagnosis:     Predicted Suboptimal Energy Intake related to current illness as evidenced by status NPO        Monitoring:  Evaluation:    PO/EN/PN intake:  Total energy intake   Labs:  Electrolyte Profile, Renal Profile and Glucose, casual    GI Profile:  Swallow Study and Bowel Function   Nutrition Focused Physical:  Overall appearance and Skin       Assessment Data:     Admission Dx:  CVA (cerebral vascular accident) [I63.9]  Cerebral infarction, unspecified mechanism [I63.9]  PMH:  has a past medical history of Diabetes mellitus.  PSH:  has a past surgical history that includes Hernia repair and APPENDECTOMY (OPEN) (2011).     Height: 1.575 m (5\' 2" )   Weight: 65.2 kg (143 lb 11.8 oz)   BMI: Body mass index is 26.29 kg/m.   IBW: 53.6 kg    Pertinent Meds:   Current Facility-Administered Medications   Medication Dose   . amLODIPine  5 mg   . aspirin  81 mg   . atorvastatin  80 mg   . insulin lispro (1 Unit Dial)  1-9 Units    And   . insulin lispro (1 Unit Dial)  1-7 Units   . sodium chloride (PF)  3 mL       Pertinent Labs:  Recent Labs   Lab 05/02/20  2339   Sodium 136   Potassium 3.7   Chloride 98   CO2 23   BUN 10   Creatinine 1.01   Glucose 373*   Calcium 10.0   Osmolality  Calculated 286          Diet Order:  Orders Placed This Encounter   Procedures   . Diet NPO effective now - Other; Sips with Meds allowed        GI symptoms: soft, rounded, non distended, non guarding, non tender, last BM: 12/18  Hydration: non pitting generalized edema per chart  I/O:  reviewed  Skin: no PI noted per chart, Braden 19, nutrition 3      Food Security Issues:  No      Learning Needs:  No education needs at this time    Estimated Needs:  Estimated Energy Needs  Total Energy Estimated Needs: 1341-1609  Method for Estimating Needs: 25-30 kcal/kg IBW 53.6 kg  Estimated Protein Needs  Total Protein Estimated Needs: 64-80  Method for Estimating Needs: 1.2-1.5 g/kg IBW 53.6 kg       Additional Comments:       Nitya Cauthon  Rustin Erhart, RD  05/03/2020 10:52 AM

## 2020-05-03 NOTE — Consults (Signed)
NEUROLOGY CONSULT NOTE    Date / Time: 05/03/20 8:41 AM  Patient Name: Walter West  Attending Physician: Jonn Shingles, MD    ASSESSMENT AND PLAN:   Walter West, who presents with confusion and R sided weakness beginning approx 2 weeks ago. Found to have a subacute stroke. Neurology consulted for stroke management.     1. Stroke  1. Diagnostics:  1. MRI brain with L sided medial temporal occipital subacute infarct with T1 shortening compatible with hemorrhagic component. Punctate lacunar infarct in the L head of the caudate lobe.  2. CTA head pending  3. CTA neck pending  4. TTE pending  5. Telemetry  6. Vitals and neuro checks q 4 hrs  7. MCOT at discharge if telemetry and echo unremarkable  8. OSA screening deferred at this time due expressive aphasia  2. Labs:  1. Glucose 373, HbA1c 12.6%  2. Thyroid function pending  3. LDL pending  4. B12 and folate pending  5. Stroke in the young panel pending  3. Meds:  1. Started on low dose ASA  2. Started on atorvastatin  3. Goal normotension. Avoid hypotension  4. Not a tPA candidate due to delay in presentation  4. Activity:  1. As tolerated  2. Fall/aspiration precautions  5. Rehab:   1. PT/OT  2. SLP    CHIEF COMPLAINT:   Confusion and R sided weakness starting approx 2 weeks ago    HISTORY OF PRESENT ILLNESS:   Walter West ZOX:[id number], MRN: [id number], is a 59 y.o. male, with medical conditions that include: type 2 NIDDM on whom I have been asked to consult on for stroke.    He is unable to provide any meaningful history regarding HPI or PMH all information gathered from the EMR.    He presented with his girlfriend who reports "two weeks of confusion and one week of R sided weakness." MRI brain with subacute infarction.     At time of my examination there is no apparent weakness but he does have significant expressive aphasia and likely some receptive aphasia vs hearing impairment.        REVIEW OF SYSTEMS:   UTA      CURRENT PROBLEM LIST:     Active  Hospital Problems    Diagnosis    CVA (cerebral vascular accident)       PAST MEDICAL HISTORY:     Past Medical History:   Diagnosis Date    Diabetes mellitus        FAMILY HISTORY:   History reviewed. No pertinent family history.     SOCIAL HISTORY:     Social History     Socioeconomic History    Marital status: Single     Spouse name: None    Number of children: None    Years of education: None    Highest education level: None   Occupational History    None   Tobacco Use    Smoking status: Never Smoker    Smokeless tobacco: Never Used   Vaping Use    Vaping Use: Never used   Substance and Sexual Activity    Alcohol use: Not Currently    Drug use: Not Currently    Sexual activity: None   Other Topics Concern    None   Social History Narrative    None     Social Determinants of Health     Financial Resource Strain:     Difficulty of Paying Living Expenses: Not  on file   Food Insecurity:     Worried About Programme researcher, broadcasting/film/video in the Last Year: Not on file    [employer] of Food in the Last Year: Not on file   Transportation Needs:     Lack of Transportation (Medical): Not on file    Lack of Transportation (Non-Medical): Not on file   Physical Activity:     Days of Exercise per Week: Not on file    Minutes of Exercise per Session: Not on file   Stress:     Feeling of Stress : Not on file   Social Connections:     Frequency of Communication with Friends and Family: Not on file    Frequency of Social Gatherings with Friends and Family: Not on file    Attends Religious Services: Not on file    Active Member of Clubs or Organizations: Not on file    Attends Banker Meetings: Not on file    Marital Status: Not on file   Intimate Partner Violence:     Fear of Current or Ex-Partner: Not on file    Emotionally Abused: Not on file    Physically Abused: Not on file    Sexually Abused: Not on file   Housing Stability:     Unable to Pay for Housing in the Last Year: Not on file    Number  of Places Lived in the Last Year: Not on file    Unstable Housing in the Last Year: Not on file       ALLERGIES:   No Known Allergies    MEDICATIONS:     Prior to Admission medications    Not on File       Current Facility-Administered Medications   Medication Dose Route Frequency    amLODIPine  5 mg Oral Daily    aspirin  81 mg Oral Daily    atorvastatin  80 mg Oral QHS    insulin lispro (1 Unit Dial)  1-9 Units Subcutaneous TID AC    And    insulin lispro (1 Unit Dial)  1-7 Units Subcutaneous QHS    sodium chloride (PF)  3 mL Intravenous Q12H SCH            PHYSICAL EXAM:     Vitals:    05/03/20 0743   BP: (!) 158/92   Pulse: (!) 109   Resp: 17   Temp: 99.9 F (37.7 C)   SpO2: 98%       General: Patient is a 59 year old male, in no acute distress.   Mental status: Awake, alert, and able to state his name. Otherwise significant expressive aphasia and appears to have some receptive aphasia. Repetition relatively preserved. Unable to name any objects on the NIHSS card. Unable to formally assess memory  Cranial Nerve Exam:  CN 2 appears to have R visual deficit vs neglect but given his aphasia difficultly formally assessing  CN 3,4,6 EOMs intact without nystagmus, PERRLA  CN 5 Facial sensation is symmetrical.  CN 7 Spontaneous facial expressions are symmetrical.  CN 8 Auditory acuity intact bilaterally to casual conversation.  CN 9 Palate rises with phonation.  CN 11 Trapezius and sternocleidomastoids intact.  CN 12 Tongue protrudes in midline.  Motor: Power is 5/5 in all major muscle groups with normal tone and bulk for age. No drift, dystonia or tremors appreciated.   Sensory:  Sensation to light touch appears intact throughout.   Coordination: Rapid alternating movements,  brisk, coordinated and symmetric.   DTRs: 2+ and symmetric bilaterally  Gait: not assessed     HEENT:  Head is midline, normocephalic, atraumatic.   Cardiac:  Patient in sinus rhythm on tele monitoring.  Chest:  Breath sounds CTA  bilaterally.   Extremities:  No deformities, cyanosis, clubbing or edema. No new rash or bruise.    SIGNIFICANT LABS:     Results     Procedure Component Value Units Date/Time    Dextrose Stick Glucose [[id number]]  (Abnormal) Collected: 05/03/20 0743    Specimen: Blood Updated: 05/03/20 0801     Glucose POCT 341 mg/dL     Dextrose Stick Glucose [[id number]]  (Abnormal) Collected: 05/03/20 0423    Specimen: Blood Updated: 05/03/20 0441     Glucose POCT 311 mg/dL     Dextrose Stick Glucose [[id number]]  (Abnormal) Collected: 05/03/20 0218    Specimen: Blood Updated: 05/03/20 0235     Glucose POCT 300 mg/dL     Hemoglobin Y8M [[id number]] Collected: 05/02/20 2339    Specimen: Blood Updated: 05/03/20 0205     Hgb A1C, % 12.6 %     Comprehensive metabolic panel [[id number]]  (Abnormal) Collected: 05/02/20 2339    Specimen: Plasma Updated: 05/03/20 0021     Sodium 136 mMol/L      Potassium 3.7 mMol/L      Chloride 98 mMol/L      CO2 23 mMol/L      Calcium 10.0 mg/dL      Glucose 010 mg/dL      Creatinine 2.72 mg/dL      BUN 10 mg/dL      Protein, Total 7.8 gm/dL      Albumin 4.1 gm/dL      Alkaline Phosphatase 108 U/L      ALT 11 U/L      AST (SGOT) 13 U/L      Bilirubin, Total 0.7 mg/dL      Albumin/Globulin Ratio 1.11 Ratio      Anion Gap 18.7 mMol/L      BUN / Creatinine Ratio 9.9 Ratio      EGFR 82 mL/min/1.53m2      Osmolality Calculated 286 mOsm/kg      Globulin 3.7 gm/dL     Ethanol (Alcohol) Level [[id number]] Collected: 05/02/20 2339    Specimen: Plasma Updated: 05/03/20 0021     Alcohol <10 mg/dL     CBC and differential [[id number]]  (Abnormal) Collected: 05/02/20 2339    Specimen: Blood Updated: 05/03/20 0009     WBC 8.1 K/cmm      RBC 4.67 M/cmm      Hemoglobin 14.1 gm/dL      Hematocrit 75.6 %      MCV 81 fL      MCH 30 pg      MCHC 37 gm/dL      RDW 43.3 %      PLT CT 354 K/cmm      MPV 7.3 fL      Neutrophils % 57.2 %      Lymphocytes 31.4 %      Monocytes 7.1 %      Eosinophils % 3.2 %      Basophils % 1.1 %       Neutrophils Absolute 4.6 K/cmm      Lymphocytes Absolute 2.5 K/cmm      Monocytes Absolute 0.6 K/cmm      Eosinophils Absolute 0.3 K/cmm      Basophils Absolute 0.1 K/cmm  SIGNIFICANT IMAGING:     MRI Brain WO Contrast Panel   Final Result   Left-sided medial temporal occipital subacute infarct with T1 shortening compatible with hemorrhagic component. Punctate lacunar infarct left head of the caudate lobe No midline shift or herniation syndrome. There are other chronic/incidental findings as    described.      Footer: FINDING CONSIDERED CRITICAL TO PATIENT CARE.  Case discussed telephonically with MAURICIO  A MARTINEZ by Dr. Arlyss Repress on12/20/2021 12:38 AM.         ReadingStation:[facility]      Echocardiogram Adult Comp W Clr/Dop/Bubble    (Results Pending)   CT Angiogram Head    (Results Pending)   CT Angiogram Neck    (Results Pending)             Case discussed with Dr. Sherilyn Cooter who will additionally review diagnostics/labs, personally evaluate the patient, further determine the treatment plan, and amend this note as indicated. Thank you for the consultation.    Ala Bent, NP  05/03/2020  8:41 AM        ADDENDUM     I personally evaluated the patient and agree with the formulation and plan as directly reviewed and edited.    Signed by: Roslyn Smiling, MD  Neurology / Epilepsy / Neurophysiology  05/03/2020 9:54 AM

## 2020-05-03 NOTE — UM Notes (Signed)
OBSERVATION  MCG: M83  History of present illness: 05/02/2020 @2340  ED  Confusion (began 2 weeks ago ) and right sided weakness arm and leg began 1 week ago. Disorient to DOB age and current month . Confabulating  Vitals upon admission:  Blood pressure (!) 175/96, pulse (!) 119, temperature 97.6 F (36.4 C), temperature source Oral, resp. rate 21, height 1.575 m, weight 66.3 kg, SpO2 97 %.  Treatment in emergency department:  CONFUSION INITIAL ONSET 2 WEEKS AGO  LEFT SIDED MEDIAL TEMPORAL OCCIPITAL SUBACUTE INFARCT WITH T1 SHORTENING COMPATIBLE W HEMORRHAGIC COMPONENT  MRI Brain WO contrast  81 mg aspirin  CONSULT : NEUROLOGY  ADMIT TO TELEMETRY FLOOR    Physical exam:  Patient appears to be confabulating; edentulous, confusion, left sided headache, right sided weakness arm and leg   Abnormal labs:  hct 38.0; mchc 37; glucose 373, anion gap 18.7; bun/creatinie ratio 9.9  Imaging:  MRI Brain WO Contrast Panel   Final Result   Left-sided medial temporal occipital subacute infarct with T1 shortening compatible with hemorrhagic component. Punctate lacunar infarct left head of the caudate lobe No midline shift or herniation syndrome. There are other chronic/incidental findings as    described.       Assessment/Plan:  81 mg aspirin  MRI Brain WO contrast   CONSULT : NEUROLOGY  ADMIT TO TELEMETRY FLOOR      05/03/2020 INTERNAL MEDICINE @0213   SUBACUTE ISCHEMIC STROKE WITH HEMORRHAGIC COMPONENT  HYPERGLYCEMIA  HYPERTENSIVE IN ED     PHYSICAL: RT SIDED WEAKNESS; LEFT SIDED HEADACHE BLURRY VISION IN RIGHT EYE    PLAN:   Begin baby aspirin per neurology, Neuro check Q4H, f/u carotid doppler and transthoracic echocardiogram, Neuro consulted, Admit to telemetry floor. SLP and slurred speech eval in am. Patient now NPO ; ordered urine drug screen, Ordered hemoglobin A1c begin sliding scale insulin   Vitals: T:97.6 F (36.4 C) (Oral),  BP:(!) 175/96, HR:(!) 119, RR:21, SaO2:97%      ecg 12 lead       T. Donley Redder BSN,  RN  Utilization Management  [facility]  [address]  [place] [address]  [phone] phone  [phone] fax

## 2020-05-03 NOTE — H&P (Signed)
Medicine History & Physical   [facility]  [employer]   Patient Name: Walter West, Walter West LOS: 0 days   Attending Physician: Marvene Staff, MD PCP: Pcp, None, MD      Assessment and Plan:                                                              1.  Subacute ischemic stroke with hemorrhagic component   Right right-sided weakness for at least 1 week associated with right eye blurry vision  Due to subacute infarct involving left-sided medial temporal occipital region associated with hemorrhagic component.  As per neurology discussion with ED okay to use baby aspirin  Hypertensive in ED we will start antihypertensive as symptom duration more than 48 hours  Neuro check 4 hourly  Follow-up carotid Doppler and transthoracic echocardiogram  Official neurology consult in a.m.  Slurred speech speech and swallow evaluation in a.m. for now we will keep n.p.o. except medication  We will check urine drug screen  2.  Hyperglycemia with history of diabetes mellitus noncompliant with medication  We will check hemoglobin A1c  Start on sliding scale insulin      DVT PPx: Mechanical   Dispo: Observation  Code: full code     History of Presenting Illness                                CC: Right-sided weakness  Walter West is a 59 y.o. male patient   Past medical history of diabetes mellitus noncompliant with medication presented to ED with above-mentioned complaints.  For last 1 week patient complaining of right-sided weakness gradually improving associated with left-sided headache and blurry vision in right eye.  Denies any dizziness loss of consciousness chest pain abdominal pain nausea vomiting or diarrhea.  MRI brain  Left-sided medial temporal occipital subacute infarct with T1 shortening compatible with hemorrhagic component. Punctate lacunar infarct left head of the caudate lobe No midline shift or herniation syndrome. There are other chronic/incidental findings as   described.  Past Medical  History:   Diagnosis Date    Diabetes mellitus      Past Surgical History:   Procedure Laterality Date    APPENDECTOMY (OPEN)  2011    HERNIA REPAIR         History reviewed. No pertinent family history.  Social History     Tobacco Use    Smoking status: Never Smoker    Smokeless tobacco: Never Used   Vaping Use    Vaping Use: Never used   Substance Use Topics    Alcohol use: Not Currently    Drug use: Not Currently       Subjective   Review of Systems:  All systems were reviewed and are negative unless pertinent positive stated in HPI.  CONSTITUTIONAL: No night sweats. No fatigue. No fever or chills.   Eyes: No visual changes. No eye pain.   ENT: No runny nose. No epistaxis.   RESPIRATORY: No cough. No hemoptysis. No shortness of breath.   CARDIOVASCULAR: No chest pains. No palpitations.   GASTROINTESTINAL: No abdominal pain. No vomiting. No diarrhea or constipation.   GENITOURINARY: No urgency. No frequency. No dysuria.   MUSCULOSKELETAL:  No musculoskeletal pain. No joint swelling.   NEUROLOGICAL: No headache or neck pain. No syncope or seizure.   PSYCHIATRIC: No depression, no psychosis.  SKIN: No rashes. No lesions. No petechiae.  ENDOCRINE: No unexplained weight loss. No polydipsia.   HEMATOLOGIC: No anemia. No purpura. No bleeding.   ALLERGIC AND IMMUNOLOGIC: No pruritus. No swelling.         Objective   Physical Exam:     Vitals: T:97.6 F (36.4 C) (Oral),  BP:(!) 175/96, HR:(!) 119, RR:21, SaO2:97%    1) General Appearance: Alert and oriented x 4. In no acute distress.   2) Eyes: Pink conjunctiva, anicteric sclera. Pupils are equally reactive to light.  3) ENT: Oral mucosa moist with no pharyngeal congestion, erythema or swelling.  4) Neck: Supple, with full range of motion. Trachea is central, no JVD noted  5) Chest: Clear to auscultation bilaterally, no wheezes or rhonchi.  6) CVS: normal rate and regular rhythm, with no murmurs.  7) Abdomen: Soft, non-tender, no palpable mass. Bowel sounds  normal. No CVA tenderness  8) Extremities: No pitting edema, pulses palpable, no calf swelling and no gross deformity.  9) Skin: Warm, dry with normal skin turgor, no rash  10) Lymphatics: No lymphadenopathy in axillary, cervical and inguinal area.   11) Neurological: Cranial nerves II-XII intact. No gross focal motor or sensory deficits noted.  12) Psychiatric: Affect is appropriate. No hallucinations.    EKG: Per my review shows sinus tachycardia at 122 bpm    Patient Vitals for the past 12 hrs:   BP Temp Pulse Resp   05/02/20 2347 (!) 175/96  (!) 119 21   05/02/20 2344   (!) 118    05/02/20 2335 (!) 178/134  (!) 125 (!) 26   05/02/20 2311 (!) 166/132 97.6 F (36.4 C) (!) 122 15     Weight Monitoring 07/11/2017 05/02/2020   Height 167.6 cm 157.5 cm   Height Method Estimated -   Weight 60 kg 66.316 kg   Weight Method Bed Scale -   BMI (calculated) 21.4 kg/m2 26.8 kg/m2          Recent Results (from the past 24 hour(s))   ECG 12 lead    Collection Time: 05/02/20 11:23 PM   Result Value Ref Range    Patient Age 49 years    Patient DOB 26-Aug-1960     Patient Height      Patient Weight      Interpretation Text       Sinus tachycardia  Consider left ventricular hypertrophy  Compared to ECG 07/11/2017 21:02:15  Early repolarization no longer present      Physician Interpreter      Ventricular Rate 122 //min    QRS Duration 91 ms    P-R Interval 130 ms    Q-T Interval 330 ms    Q-T Interval(Corrected) 470 ms    P Wave Axis 69 deg    QRS Axis 67 deg    T Axis 53 years   CBC and differential    Collection Time: 05/02/20 11:39 PM   Result Value Ref Range    WBC 8.1 4.0 - 11.0 K/cmm    RBC 4.67 4.00 - 5.70 M/cmm    Hemoglobin 14.1 13.0 - 17.5 gm/dL    Hematocrit 11.9 (L) 39.0 - 52.5 %    MCV 81 80 - 100 fL    MCH 30 28 - 35 pg    MCHC 37 (H) 32 -  36 gm/dL    RDW 44.0 10.2 - 72.5 %    PLT CT 354 130 - 440 K/cmm    MPV 7.3 6.0 - 10.0 fL    Neutrophils % 57.2 42.0 - 78.0 %    Lymphocytes 31.4 15.0 - 46.0 %    Monocytes 7.1  3.0 - 15.0 %    Eosinophils % 3.2 0.0 - 7.0 %    Basophils % 1.1 0.0 - 3.0 %    Neutrophils Absolute 4.6 1.7 - 8.6 K/cmm    Lymphocytes Absolute 2.5 0.6 - 5.1 K/cmm    Monocytes Absolute 0.6 0.1 - 1.7 K/cmm    Eosinophils Absolute 0.3 0.0 - 0.8 K/cmm    Basophils Absolute 0.1 0.0 - 0.3 K/cmm   Comprehensive metabolic panel    Collection Time: 05/02/20 11:39 PM   Result Value Ref Range    Sodium 136 136 - 147 mMol/L    Potassium 3.7 3.5 - 5.3 mMol/L    Chloride 98 98 - 110 mMol/L    CO2 23 20 - 30 mMol/L    Calcium 10.0 8.5 - 10.5 mg/dL    Glucose 366 (H) 71 - 99 mg/dL    Creatinine 4.40 3.47 - 1.30 mg/dL    BUN 10 7 - 22 mg/dL    Protein, Total 7.8 6.0 - 8.3 gm/dL    Albumin 4.1 3.5 - 5.0 gm/dL    Alkaline Phosphatase 108 40 - 145 U/L    ALT 11 0 - 55 U/L    AST (SGOT) 13 10 - 42 U/L    Bilirubin, Total 0.7 0.1 - 1.2 mg/dL    Albumin/Globulin Ratio 1.11 0.80 - 2.00 Ratio    Anion Gap 18.7 (H) 7.0 - 18.0 mMol/L    BUN / Creatinine Ratio 9.9 (L) 10.0 - 30.0 Ratio    EGFR 82 60 - 150 mL/min/1.45m2    Osmolality Calculated 286 275 - 300 mOsm/kg    Globulin 3.7 2.0 - 4.0 gm/dL   Ethanol (Alcohol) Level    Collection Time: 05/02/20 11:39 PM   Result Value Ref Range    Alcohol <10 0 - 9 mg/dL   Hemoglobin Q2V    Collection Time: 05/02/20 11:39 PM   Result Value Ref Range    Hgb A1C, % 12.6 %        No Known Allergies   MRI Brain WO Contrast Panel    Result Date: 05/03/2020  Left-sided medial temporal occipital subacute infarct with T1 shortening compatible with hemorrhagic component. Punctate lacunar infarct left head of the caudate lobe No midline shift or herniation syndrome. There are other chronic/incidental findings as  described. Footer: FINDING CONSIDERED CRITICAL TO PATIENT CARE.  Case discussed telephonically with MAURICIO  A MARTINEZ by Dr. Arlyss Repress on12/20/2021 12:38 AM. ReadingStation:[facility]     Home Medications     Med List Status: Complete Set By: Jaynie Collins, RN at 05/02/2020 11:42 PM        No  Medications         Meds given in the ED:  Medications   amLODIPine (NORVASC) tablet 5 mg (has no administration in time range)   aspirin chewable tablet 81 mg (has no administration in time range)   dextrose (D10W) 10% bolus 125 mL (has no administration in time range)   glucagon (rDNA) (GLUCAGEN) injection 1 mg (has no administration in time range)   insulin lispro (1 Unit Dial) (HumaLOG) injection pen 1-9 Units (has no administration in time range)  And   insulin lispro (1 Unit Dial) (HumaLOG) injection pen 1-7 Units (has no administration in time range)     And   insulin lispro (1 Unit Dial) (HumaLOG) injection pen 1-7 Units (has no administration in time range)      Time Spent:     Ilda Mori, MD     05/03/20,2:13 AM   MRN: [id number]                                      CSN: [id number] DOB: 1960/08/21

## 2020-05-03 NOTE — SLP Plan of Care Note (Signed)
[facility]  Department of Rehabilitation Services      Speech Therapy General Note    Pt Name: Walter West CSN: [id number]    Time: 11:00am    Attempted to see pt for SLP bedside swallow and speech/language evaluation. Patient off floor for CT at this time. Will re-attempt later in the day.    Team Communication: Wendy-RN    Plan: Will follow up later in the day for speech/language and swallow evaluation.    Nicole Cella M.A., CCC-SLP  Speech-Language Pathologist, Clin II  [facility]  Contact via [place], [place] Phone [phone], or Pager [phone]

## 2020-05-03 NOTE — SLP Plan of Care Note (Signed)
[facility]  Department of Rehabilitation Services      Speech Therapy General Note    Pt Name: Walter West CSN: [id number]    Time: 2:45am    Attempted to see pt for SLP swallow assessment. Patient refused participation and yelled "no!" when SLP re-attempted. Educated that patient will remain NPO unless patient works with SLP and his swallowing status is evaluated. Patient verbalized "okay" and asked SLP to leave.    Team Communication: Wendy-RN    Plan: Will follow up 05/04/20 for bedside speech/language/swallowing assessment.    Nicole Cella M.A., CCC-SLP  Speech-Language Pathologist, Clin II  [facility]  Contact via [place], [place] Phone [phone], or Pager [phone]

## 2020-05-03 NOTE — Progress Notes (Addendum)
NURSE NOTE SUMMARY  [facility] - SURG TELEMETRY STEP-DOWN   Patient Name: Walter West   Attending Physician: Jonn Shingles, MD   Today's date:   05/03/2020 LOS: 0 days   Shift Summary:                                                              Pt refusing neuro checks as well as insulin. Dr. Georgianne Fick notified.   1530: Pt now sobbing in his room, apologizing for everything. Wants to go home. Dr. Georgianne Fick notified.   1800: Pt more agreeable. Passed 3oz water swallow. Diet ordered.      Provider Notifications:        Rapid Response Notifications:  Mobility:      PMP Activity: Step 6 - Walks in Room (05/03/2020  3:00 AM)     Weight tracking:  Family Dynamic:   Last 3 Weights for the past 72 hrs (Last 3 readings):   Weight   05/03/20 0306 65.2 kg (143 lb 11.8 oz)   05/02/20 2311 66.3 kg (146 lb 3.2 oz)             Last Bowel Movement   Last BM Date: 05/01/20 (Pt stated.)

## 2020-05-04 ENCOUNTER — Inpatient Hospital Stay: Payer: Self-pay

## 2020-05-04 LAB — FIBRINOGEN: Fibrinogen: 358 mg/dL (ref 181–469)

## 2020-05-04 LAB — RHEUMATOID FACTOR: Rheumatoid Factor: <15 IU/mL (ref 0.0–29.9)

## 2020-05-04 LAB — C3 COMPLEMENT: Complement C3: 162 mg/dL (ref 82–185)

## 2020-05-04 LAB — C-REACTIVE PROTEIN: C-Reactive Protein: 0.2 mg/dL (ref 0.02–0.80)

## 2020-05-04 LAB — VH DEXTROSE STICK GLUCOSE
Glucose POCT: 269 mg/dL — ABNORMAL HIGH (ref 71–99)
Glucose POCT: 267 mg/dL — ABNORMAL HIGH (ref 71–99)
Glucose POCT: 315 mg/dL — ABNORMAL HIGH (ref 71–99)

## 2020-05-04 LAB — C4 COMPLEMENT: Complement 4: 20 mg/dL (ref 15–53)

## 2020-05-04 LAB — SEDIMENTATION RATE: Sed Rate: 28 mm/hr — ABNORMAL HIGH (ref 0–20)

## 2020-05-04 LAB — ANTITHROMBIN III LEVEL: Antithrombin 3: >128 % — ABNORMAL HIGH (ref 80.0–120.0)

## 2020-05-04 MED ORDER — SODIUM CHLORIDE BACTERIOSTATIC 0.9 % IJ SOLN
INTRAMUSCULAR | Status: AC
Start: 2020-05-04 — End: ?
  Filled 2020-05-04: qty 10

## 2020-05-04 MED ORDER — ATENOLOL 25 MG PO TABS
12.5000 mg | ORAL_TABLET | Freq: Every day | ORAL | 0 refills | Status: DC
Start: 2020-05-04 — End: 2020-06-20

## 2020-05-04 MED ORDER — ASPIRIN 81 MG PO CHEW
81.0000 mg | CHEWABLE_TABLET | Freq: Every day | ORAL | 0 refills | Status: DC
Start: 2020-05-05 — End: 2020-06-20

## 2020-05-04 MED ORDER — ATORVASTATIN CALCIUM 80 MG PO TABS
80.0000 mg | ORAL_TABLET | Freq: Every evening | ORAL | 0 refills | Status: DC
Start: 2020-05-04 — End: 2020-06-20

## 2020-05-04 MED ORDER — GLIPIZIDE 5 MG PO TABS
10.0000 mg | ORAL_TABLET | Freq: Every morning | ORAL | Status: DC
Start: 2020-05-04 — End: 2020-05-04
  Administered 2020-05-04: 13:00:00 10 mg via ORAL
  Filled 2020-05-04: qty 2

## 2020-05-04 MED ORDER — METFORMIN HCL 500 MG PO TABS
500.0000 mg | ORAL_TABLET | Freq: Two times a day (BID) | ORAL | Status: DC
Start: 2020-05-05 — End: 2020-05-04

## 2020-05-04 MED ORDER — METFORMIN HCL 500 MG PO TABS
500.0000 mg | ORAL_TABLET | Freq: Two times a day (BID) | ORAL | 0 refills | Status: DC
Start: 2020-05-05 — End: 2020-06-20

## 2020-05-04 MED ORDER — GLIPIZIDE 10 MG PO TABS
10.0000 mg | ORAL_TABLET | Freq: Every morning | ORAL | 0 refills | Status: DC
Start: 2020-05-05 — End: 2020-06-20

## 2020-05-04 MED ORDER — AMLODIPINE BESYLATE 5 MG PO TABS
5.0000 mg | ORAL_TABLET | Freq: Every day | ORAL | 0 refills | Status: DC
Start: 2020-05-05 — End: 2020-06-20

## 2020-05-04 MED ORDER — VH ATENOLOL 25 MG PO (ALLOW QUARTERING)
12.5000 mg | ORAL_TABLET | Freq: Every day | ORAL | Status: DC
Start: 2020-05-04 — End: 2020-05-04

## 2020-05-04 NOTE — Progress Notes (Addendum)
NURSE NOTE SUMMARY  [facility] - SURG TELEMETRY STEP-DOWN   Patient Name: Walter West   Attending Physician: Clabe Seal, MD   Today's date:   05/04/2020 LOS: 1 days   Shift Summary:                                                              0800: Pt  Awake in bed this am. Slightly agitated.  States that he didn't sleep well. States that he is leaving, "here in a little bit" whether additional testing is done or not. Neurology PA Dahlia Client in to see pt. Pt asks, "Aw now what the hell do you want?!" Answers inappropriately and then changes answer to appropriate answer. Expresses that he has a headache. Will administer Tylenol. CBIR. Will monitor.   1610: Pt with transport for ECHO.    Provider Notifications:        Rapid Response Notifications:  Mobility:      PMP Activity: Step 6 - Walks in Room (05/04/2020 12:11 AM)     Weight tracking:  Family Dynamic:   Last 3 Weights for the past 72 hrs (Last 3 readings):   Weight   05/03/20 0306 65.2 kg (143 lb 11.8 oz)   05/02/20 2311 66.3 kg (146 lb 3.2 oz)             Last Bowel Movement   Last BM Date: 05/01/20 (Pt stated.)

## 2020-05-04 NOTE — Discharge Summary (Signed)
[facility]      Patient: Walter West  Admission Date: 05/02/2020   DOB: Mar 20, 1961  Discharge Date: 05/04/2020    MRN: [id number]  Discharge Attending:Clabe Seal, MD     Referring Physician: Pcp, None, MD  PCP: Pcp, None, MD       DISCHARGE SUMMARY        Discharge Diagnosis:   Active Hospital Problems    Diagnosis    CVA (cerebral vascular accident)      Subacute ischemic stroke with hemorrhagic component  Right right-sided weakness for at least 1 week associated with right eye blurry vision  Due to subacute infarct involving left-sided medial temporal occipital region associated with hemorrhagic component.  As per neurology discussion with ED okay to use baby aspirin  Also some  has expressive aphasia however no evidence of receptive aphasia  .    HTN- bp meds prescribed     t2 DM --uncontrolled . a1c level 12.6. po diabetic meds prescribed     Abnormal 2 d echo findings -- cardiology follow up     Subjective:   Patient is wanting to leave   He says he is feeling fine   Has been giving hard time to RN    Upon my exam today physical exam reveals no new findings to hold discharge.    Presentation History   See HPI for details.  Hospital Course (1 Days)     He is wanting to leave . I advised him to stay as I am not comfortable to let him go at this minute. PT and OT consults not done yet. His sugars and vitals seems needs better control. I told him it ll be AMA leave if he wants to go. He agrees to sign AMA form . I explained what AMA leave entails . He had chance to review the form . RN present in the process. I have prescribed him medications and placed follow up information to mitigate risk of adverse events . He will talk to neurology if he ends up following with them in regards to [facility] monitor .       Discharge Condition: Medically Stable  Consultants: neurology  Discharged to: Home     Discharge Medications:     Medication List      START taking these medications    amLODIPine 5 MG  tablet  Commonly known as: NORVASC  Take 1 tablet (5 mg total) by mouth daily  Start taking on: May 05, 2020  Notes to patient: NEW MEDICATION  Next Dose Due: 12/22     aspirin 81 MG chewable tablet  Chew 1 tablet (81 mg total) by mouth daily  Start taking on: May 05, 2020  Notes to patient: NEW MEDICATION  Next Dose Due: 12/22     atenolol 25 MG tablet  Commonly known as: TENORMIN  Take 0.5 tablets (12.5 mg total) by mouth daily  Notes to patient: NEW MEDICATION  Next Dose Due: 12/22     atorvastatin 80 MG tablet  Commonly known as: LIPITOR  Take 1 tablet (80 mg total) by mouth nightly  Notes to patient: NEW MEDICATION  Next Dose Due: 12/21     glipiZIDE 10 MG tablet  Commonly known as: GLUCOTROL  Take 1 tablet (10 mg total) by mouth every morning before breakfast  Start taking on: May 05, 2020  Notes to patient: NEW MEDICATION  Next Dose Due: 12/22     metFORMIN 500 MG tablet  Commonly known  as: GLUCOPHAGE  Take 1 tablet (500 mg total) by mouth 2 (two) times daily with meals Start tomorrow dinner time  Start taking on: May 05, 2020  Notes to patient: NEW MEDICATION  Next Dose Due: 12/22 Dinner Time           Where to Get Your Medications      These medications were sent to [facility] - [place], [place] - [address]  [address], [place] [address]    Phone: [phone]    amLODIPine 5 MG tablet   aspirin 81 MG chewable tablet   atenolol 25 MG tablet   atorvastatin 80 MG tablet   glipiZIDE 10 MG tablet   metFORMIN 500 MG tablet                  Vitals:    05/03/20 1953 05/04/20 0330 05/04/20 0735 05/04/20 1059   BP: (!) 159/92 116/73 116/78 140/85   Pulse: (!) 117 (!) 101 (!) 104 (!) 105   Resp: 20 20 17 14    Temp: 98.2 F (36.8 C) 98.1 F (36.7 C) 97 F (36.1 C) 97.9 F (36.6 C)   TempSrc: Temporal Temporal Temporal Temporal   SpO2: 96% 97% 99% 96%   Weight:       Height:           Procedures/Imaging:   CT Angiogram Head    Result Date: 05/03/2020  Normal  CTA head. ReadingStation:[id number]    CT Angiogram Neck    Result Date: 05/03/2020  Abnormal CT angiogram of the neck demonstrating: 1. Circumferential atherosclerotic calcification seen at the bifurcation of the bilateral common carotid arteries without hemodynamically significant stenosis. 2. Hypoplastic left vertebral artery. ReadingStation:[id number]    MRI Brain WO Contrast Panel    Result Date: 05/03/2020  Left-sided medial temporal occipital subacute infarct with T1 shortening compatible with hemorrhagic component. Punctate lacunar infarct left head of the caudate lobe No midline shift or herniation syndrome. There are other chronic/incidental findings as  described. Footer: FINDING CONSIDERED CRITICAL TO PATIENT CARE.  Case discussed telephonically with [facility]  A MARTINEZ by Dr. Arlyss Repress on12/20/2021 12:38 AM. ReadingStation:[email or web address]    Echo Results     Procedure Component Value Units Date/Time    Echocardiogram Adult Jilda Roche Clr/Dop/Bubble [[id number]] Collected: 05/04/20 1007     Updated: 05/04/20 1221    Narrative:                                                                                              [facility]                                                                                  [address]  [place], [place] [address]                                                                                  [phone]       Transthoracic Echo Report    Name:  JAMELLE, GOLDSTON    Age:  59       Gender:  M     Exam Date:    05/04/2020 10:07        Ordering Physician:RAHMAN,                                                                                            MOHAMMAD A     DOB:   1960-12-23             Exam Location:[facility]                Attending Physician:[id number], RAHMAN    MRN:   [id number]               Ht (in):62  Wt (lb):143 BSA (m2): 1.7 Technologist:      Tye Maryland RDCS     Indications:    stroke    History:    Procedure Performed: A complete two-dimensional, color flow and Doppler transthoracic echocardiogram was performed.    BP:  116   / 78      HR: 100                      Rhythm:           Sinus          Contrast: Agitated SalineDose (mL):               Technical Quality:Good       MEASUREMENTS    2D ECHO    LV Diastolic Diameter PLAX                          4.3 cm                                      LV Stroke Volume 2D Cubed                           45.7 cm3             LV Systolic Diameter PLAX                           3.2 cm  LV Cardiac Index MOD BP                             2029.4 cm3/min      IVS Diastolic Thickness                             1.3 cm                                      LV Ejection Fraction MOD 4C                         49.2 %               LVPW Diastolic Thickness                            1.3 cm                                      LV Cardiac Index MOD 4C                             2029.4 cm3/min      LVOT Diameter                                       2.3 cm                                      LV Ejection Fraction MOD 2C                         36.8 %               Aortic Root Diameter                                3.6 cm                                      LV Stroke Volume Index                              26.3 cm3/m2          LA Systolic Diameter LX                             3.3 cm                                      LV Cardiac Index MOD 2C  1664.7 cm3/min      LV Ejection Fraction MOD BP                         44.9 %                                      LA Volume Index                                     19.5 mL/m2           LV Stroke Volume 2D Teich                           41.6 cm3                                    Ascending Aorta Diameter                            3.3 cm                  M-MODE    TAPSE                                               2.0 cm                   AORTIC VALVE    AV Peak Velocity                                    81.6 cm/s                                   LVOT Peak Gradient                                  1.2 mmHg             AV Peak Gradient                                    2.7 mmHg                                    AV Area Cont Eq pk                                  2.8 cm2              LVOT Peak Velocity  55.7 cm/s               MITRAL VALVE    MV Pressure Half Time                               47.0 ms                                     LV E' Lateral Velocity                              6.4 cm/s             MV Area PHT                                         4.7 cm2                                     Mitral E to LV E' Lateral Ratio                     10.3                 Mitral E Point Velocity                             66.0 cm/s                                   LV E' Septal Velocity                               4.9 cm/s             Mitral A Point Velocity                             80.6 cm/s                                   Mitral E to LV E' Septal Ratio                      13.5                 Mitral E to A Ratio                                 0.8                     PULMONIC VALVE    PV Peak Velocity                                    71.8 cm/s  PV Peak Gradient                                    2.1 mmHg                   FINDINGS    2D / M-MODE    Left Ventricle     The left ventricular cavity size is normal. There is mild concentric left ventricular hypertrophy.  Low normal                        left ventricular systolic function with an estimated LVEF of 50 to 55%.  No regional wall motion normalities.                        Grade I diastolic dysfunction.       Right Ventricle    The right ventricle is normal in size and systolic function.    Left Atrium        The left atrium is normal in size.    Right Atrium       The right atrium is normal in size.     Aorta              The aortic root is normal in size.    Vessels            The inferior vena cava (IVC) is normal in size. Normal respiratory variation of the inferior vena cava is                        present.  Estimated right atrial pressure 3 mmHg.    Pericardium        No significant pericardial effusion detected.       VALVULAR / DOPPLER    Aortic Valve       The aortic valve is trileaflet. There is nodular sclerosis at the tip of the left coronary cusp. There is no                        significant stenosis.  There is no significant regurgitation.    Mitral Valve       The mitral valve leaflets are mildly thickened. There is mild mitral annular calcification. There is no                        significant stenosis. The mitral valve regurgitation is mild.    Tricuspid Valve    The tricuspid valve is normal in structure. There is no significant stenosis. There is no significant                        regurgitation. The pulmonary arterial systolic pressure could not be estimated due to an incomplete                        tricuspid regurgitation velocity profile.    Pulmonary Valve    The pulmonic valve is not well visualized. There is no significant stenosis. The pulmonary valve                        regurgitation is trace.       ADDITIONAL FINDINGS    Atrial  Septum      No evidence of an atrial septal defect by color doppler. No patent foramen ovale is demonstrated by                        agitated saline injection.       Conclusions        1: The left ventricular cavity size is normal with mild concentric hypertrophy.                       2: Low normal left ventricular systolic function with an estimated LVEF of 50 to 55%.                        3: No regional wall motion normalities.                        4: Grade I diastolic dysfunction.                       5: Normal right ventricular size and systolic function.                       6: Normal biatrial size                       7: Nodular  sclerosis at the tip of the left coronary cusp of the aortic valve without stenosis or regurgitation.                       8: Mildly thickened mitral valve leaflets with mild annular calcification.  Mild mitral regurgitation.                       9: Unable to estimate PASP.                       10: No patent foramen ovale is demonstrated by agitated saline injection.                       11: No prior study for comparison                 Claybon Jabs MD     (Electronically Signed)     Final Date:04 May 2020 12:21                  Diagnostics       Last Labs   Recent Labs   Lab 05/02/20  2339   WBC 8.1   RBC 4.67   Hemoglobin 14.1   Hematocrit 38.0*   MCV 81   PLT CT 354       Recent Labs   Lab 05/02/20  2339   Sodium 136   Potassium 3.7   Chloride 98   CO2 23   BUN 10   Creatinine 1.01   Glucose 373*   Calcium 10.0       Microbiology Results (last 15 days)     ** No results found for the last 360 hours. **           Patient Instructions   Discharge Diet: Addressed. See AVS  Discharge Activity: Addressed. See AVS    Follow Up Appointment:   Follow-up Information     Valorie Roosevelt, MD  Follow up in 2 week(s).    Specialties: Epilepsy, Clinical Neurophysiology, Neurology  Why: Clinic will call Patient with date and time of 2 week follow up with Dr. Sherilyn Cooter or NP for post stroke care. Clinic is aware of Patients needs.  Contact information:  [address] [place] [address]  [phone]             [facility] Follow up in 1 week(s).    Specialty: Internal Medicine  Why: Since Patient is leaving against medical advice Transition Clinic request that Patient call to schedule follow up appointment in 1 week. Clinic will help Patient become established with a primary care doctor.  Contact information:  [address]  [place] [address]  [phone]           [facility] Follow up in 2 week(s).    Why: To follow up on echocardiogram results,  Patient to call Cardiology office to schedule.**Office is scheduling new Patient appointments in March.  Contact information:  [address]  [place] [address]  [phone]                         Have your PCP repeat your CMP and CBC . He Francis Dowse can order these at your follow up visit.       There are no questions and answers to display.          Time spent for discharge process 38 minutes.    Signed,  Clabe Seal, MD    10:07 PM 05/04/2020

## 2020-05-04 NOTE — Plan of Care (Signed)
Problem: Compromised Tissue integrity  Goal: Damaged tissue is healing and protected  Outcome: Progressing  Goal: Nutritional status is improving  Outcome: Progressing

## 2020-05-04 NOTE — Plan of Care (Signed)
Problem: Compromised Tissue integrity  Goal: Damaged tissue is healing and protected  Outcome: Progressing  Flowsheets (Taken 05/04/2020 0011)  Damaged tissue is healing and protected:   Monitor/assess Braden scale every shift   Provide wound care per wound care algorithm   Reposition patient every 2 hours and as needed unless able to reposition self   Avoid shearing injuries   Relieve pressure to bony prominences for patients at moderate and high risk   Increase activity as tolerated/progressive mobility   Keep intact skin clean and dry   Use bath wipes, not soap and water, for daily bathing   Monitor patient's hygiene practices  Goal: Nutritional status is improving  Outcome: Progressing  Flowsheets (Taken 05/03/2020 0352)  Nutritional status is improving:   Allow adequate time for meals   Encourage patient to take dietary supplement(s) as ordered   Collaborate with Clinical Nutritionist   Include patient/patient care companion in decisions related to nutrition

## 2020-05-04 NOTE — Progress Notes (Signed)
NEUROLOGY PROGRESS NOTE    Date / Time: 05/04/20 7:23 AM  Patient Name: Walter West  Attending Physician: Jonn Shingles, MD    ASSESSMENT AND PLAN:   Walter West, who presents with confusion and R sided weakness beginning approx 2 weeks ago. Found to have a subacute stroke. Neurology consulted for stroke management.     If echo is unremarkable patient is stable for discharge; pending PT/OT recommendations. He should follow up in the clinic with Dr. Sherilyn Cooter or NP within 1-2 weeks of discharge for post stroke care.     Stroke  Diagnostics:  MRI brain with L sided medial temporal occipital subacute infarct with T1 shortening compatible with hemorrhagic component. Punctate lacunar infarct in the L head of the caudate lobe.  CTA head without hemodynamically significant stenosis.  CTA neck was normal   TTE pending  Telemetry  Vitals and neuro checks q 4 hrs  MCOT at discharge if telemetry and echo unremarkable  OSA screening deferred at this time due expressive aphasia  Labs:  Glucose 373, HbA1c 12.6%  Thyroid function normal  LDL 170  B12 320 and folate 13.3  Stroke in the young panel pending  ESR 28  Otherwise normal thus far  Meds:  Started on low dose ASA  Started on atorvastatin  Goal normotension. Avoid hypotension  Not a tPA candidate due to delay in presentation  Activity:  As tolerated  Fall/aspiration precautions  Rehab:   PT/OT  SLP    CHIEF COMPLAINT:   Confusion and R sided weakness starting approx 2 weeks ago    HISTORY OF PRESENT ILLNESS:   Walter West ZDG:[id number], MRN: [id number], is a 59 y.o. male, with medical conditions that include: type 2 NIDDM on whom I have been asked to consult on for stroke.    He is unable to provide any meaningful history regarding HPI or PMH all information gathered from the EMR.    He presented with his girlfriend who reports "two weeks of confusion and one week of R sided weakness." MRI brain with subacute infarction.     At time of my examination there is no  apparent weakness but he does have significant expressive aphasia and likely some receptive aphasia vs hearing impairment.     12/21: Complaining of a headache this morning and dizziness with walking.  Numbness and tingling persists in right upper extremity and right foot.  CTAs of head and neck were unremarkable, discussed this with patient. He appears very agitated and frustrated with his expressive aphasia.  Per RN, he refused to work with SLP yesterday.  Awaiting echo.     REVIEW OF SYSTEMS:   UTA      CURRENT PROBLEM LIST:     Active Hospital Problems    Diagnosis    CVA (cerebral vascular accident)       PAST MEDICAL HISTORY:     Past Medical History:   Diagnosis Date    Diabetes mellitus        FAMILY HISTORY:   History reviewed. No pertinent family history.     SOCIAL HISTORY:     Social History     Socioeconomic History    Marital status: Single     Spouse name: None    Number of children: None    Years of education: None    Highest education level: None   Occupational History    None   Tobacco Use    Smoking status: Never Smoker    Smokeless  tobacco: Never Used   Vaping Use    Vaping Use: Never used   Substance and Sexual Activity    Alcohol use: Not Currently    Drug use: Not Currently    Sexual activity: None   Other Topics Concern    None   Social History Narrative    None     Social Determinants of Health     Financial Resource Strain:     Difficulty of Paying Living Expenses: Not on file   Food Insecurity:     Worried About Programme researcher, broadcasting/film/video in the Last Year: Not on file    [employer] of Food in the Last Year: Not on file   Transportation Needs:     Lack of Transportation (Medical): Not on file    Lack of Transportation (Non-Medical): Not on file   Physical Activity:     Days of Exercise per Week: Not on file    Minutes of Exercise per Session: Not on file   Stress:     Feeling of Stress : Not on file   Social Connections:     Frequency of Communication with Friends and Family: Not on file    Frequency  of Social Gatherings with Friends and Family: Not on file    Attends Religious Services: Not on file    Active Member of Clubs or Organizations: Not on file    Attends Banker Meetings: Not on file    Marital Status: Not on file   Intimate Partner Violence:     Fear of Current or Ex-Partner: Not on file    Emotionally Abused: Not on file    Physically Abused: Not on file    Sexually Abused: Not on file   Housing Stability:     Unable to Pay for Housing in the Last Year: Not on file    Number of Places Lived in the Last Year: Not on file    Unstable Housing in the Last Year: Not on file       ALLERGIES:   No Known Allergies    MEDICATIONS:     Prior to Admission medications    Not on File       Current Facility-Administered Medications   Medication Dose Route Frequency    amLODIPine  5 mg Oral Daily    aspirin  81 mg Oral Daily    atorvastatin  80 mg Oral QHS    insulin lispro (1 Unit Dial)  1-9 Units Subcutaneous TID AC    And    insulin lispro (1 Unit Dial)  1-7 Units Subcutaneous QHS    sodium chloride (PF)  3 mL Intravenous Q12H SCH            PHYSICAL EXAM:     Vitals:    05/04/20 0330   BP: 116/73   Pulse: (!) 101   Resp: 20   Temp: 98.1 F (36.7 C)   SpO2: 97%       General: Patient is a 59 year old male, in no acute distress.   Mental status: Awake, alert, and able to state his name. Otherwise significant expressive aphasia and appears to have some receptive aphasia. Affect is agitated. Repetition relatively preserved.  Correctly able to identify one object, otherwise cannot name objects on NIH SS. Unable to formally assess memory  Cranial Nerve Exam:  CN 2 appears to have R visual deficit vs neglect but given his aphasia difficultly formally assessing  CN 3,4,6 EOMs  intact without nystagmus, PERRLA  CN 5 Facial sensation is symmetrical.  CN 7 Spontaneous facial expressions are symmetrical.  CN 8 Auditory acuity intact bilaterally to casual conversation.  CN 9 Palate rises with phonation.  CN 11  Trapezius and sternocleidomastoids intact.  CN 12 Tongue protrudes in midline.  Motor: Power is 5/5 in all major muscle groups with normal tone and bulk for age. No drift, dystonia or tremors appreciated.   Sensory:  Sensation to light touch appears intact throughout; subjectively diminished in RUE and right foot.   Coordination: Rapid alternating movements, brisk, coordinated and symmetric.   DTRs: 2+ and symmetric bilaterally  Gait: not assessed     HEENT:  Head is midline, normocephalic, atraumatic.   Cardiac:  Patient in sinus rhythm on tele monitoring.  Chest:  Breath sounds CTA bilaterally.   Extremities:  No deformities, cyanosis, clubbing or edema. No new rash or bruise.    SIGNIFICANT LABS:     Results       Procedure Component Value Units Date/Time    C Reactive Protein [[id number]] Collected: 05/04/20 0529    Specimen: Plasma Updated: 05/04/20 0704     C-Reactive Protein 0.20 mg/dL     C3 Complement [[id number]] Collected: 05/04/20 0529    Specimen: Plasma Updated: 05/04/20 0704     Complement C3 162 mg/dL     C4 complement [[id number]] Collected: 05/04/20 0529    Specimen: Plasma Updated: 05/04/20 0704     Complement 4 20 mg/dL     Rheumatoid factor [[id number]] Collected: 05/04/20 0529    Specimen: Blood Updated: 05/04/20 0655     Rheumatoid Factor <15.0 IU/mL     Sedimentation rate (ESR) [[id number]] Collected: 05/04/20 0529    Specimen: Blood Updated: 05/04/20 0629    ANA IFA Reflex Titer and Pattern [[id number]] Collected: 05/04/20 0529    Specimen: Blood Updated: 05/04/20 0529    Cardiolipin Antibody Panel #2 [[id number]] Collected: 05/04/20 0529    Specimen: Blood Updated: 05/04/20 0529    Protein C Antigen [[id number]] Collected: 05/04/20 0529    Specimen: Blood Updated: 05/04/20 0529    Protein S antigen, total [[id number]] Collected: 05/04/20 0529    Specimen: Blood Updated: 05/04/20 0529    SS-A , SS-B (SJOGREN'S) [[id number]] Collected: 05/04/20 0529    Specimen: Blood Updated: 05/04/20 0529    Factor V  Leiden [[id number]] Collected: 05/04/20 0529    Specimen: Blood Updated: 05/04/20 0529    Prothrombin (Factor II) G20210A Mutation Analysis [[id number]] Collected: 05/04/20 0529    Specimen: Blood Updated: 05/04/20 0529    Dextrose Stick Glucose [[id number]]  (Abnormal) Collected: 05/04/20 0331    Specimen: Blood Updated: 05/04/20 0348     Glucose POCT 269 mg/dL     Dextrose Stick Glucose [[id number]]  (Abnormal) Collected: 05/03/20 2141    Specimen: Blood Updated: 05/03/20 2157     Glucose POCT 301 mg/dL     Dextrose Stick Glucose [[id number]]  (Abnormal) Collected: 05/03/20 1727    Specimen: Blood Updated: 05/03/20 1745     Glucose POCT 253 mg/dL     Vitamin G26 And Folate [[id number]] Collected: 05/03/20 0930    Specimen: Plasma Updated: 05/03/20 1208     Folate 13.3 ng/mL      Vitamin B-12 320 pg/mL     Dextrose Stick Glucose [[id number]]  (Abnormal) Collected: 05/03/20 1136    Specimen: Blood Updated: 05/03/20 1153     Glucose POCT 277 mg/dL     TSH, Abn Reflex to Free T4, Serum [[id number]]  Collected: 05/03/20 0930    Specimen: Plasma Updated: 05/03/20 1055     T4 Free Not Indicated ng/dL      TSH 1.61 uIU/mL     Lipid panel [[id number]]  (Abnormal) Collected: 05/03/20 0930    Specimen: Plasma Updated: 05/03/20 1048     Cholesterol 257 mg/dL      Triglycerides 811 mg/dL      HDL 45 mg/dL      LDL Calculated 914 mg/dL      Coronary Heart Disease Risk 5.71     VLDL 42    Dextrose Stick Glucose [[id number]]  (Abnormal) Collected: 05/03/20 0743    Specimen: Blood Updated: 05/03/20 0801     Glucose POCT 341 mg/dL             SIGNIFICANT IMAGING:     CT Angiogram Head   Final Result   Normal CTA head.      ReadingStation:[id number]      CT Angiogram Neck   Final Result   Abnormal CT angiogram of the neck demonstrating:   1. Circumferential atherosclerotic calcification seen at the bifurcation of the bilateral common carotid arteries without hemodynamically significant stenosis.   2. Hypoplastic left vertebral artery.       ReadingStation:[id number]      MRI Brain WO Contrast Panel   Final Result   Left-sided medial temporal occipital subacute infarct with T1 shortening compatible with hemorrhagic component. Punctate lacunar infarct left head of the caudate lobe No midline shift or herniation syndrome. There are other chronic/incidental findings as    described.      Footer: FINDING CONSIDERED CRITICAL TO PATIENT CARE.  Case discussed telephonically with MAURICIO  A MARTINEZ by Dr. Arlyss Repress on12/20/2021 12:38 AM.         ReadingStation:[email or web address]      Echocardiogram Adult Comp W Clr/Dop/Bubble    (Results Pending)             Case discussed with Dr. Sherilyn Cooter who will additionally review diagnostics/labs, personally evaluate the patient, further determine the treatment plan, and amend this note as indicated. Thank you for the consultation.    Cyndia Skeeters [place], [place]  05/04/2020  7:23 AM          ADDENDUM     I personally evaluated the patient and agree with the formulation and plan as directly reviewed and edited.    Signed by: Roslyn Smiling, MD  Neurology / Epilepsy / Neurophysiology  05/04/2020 11:08 AM

## 2020-05-04 NOTE — Progress Notes (Addendum)
Readmission Risk  [facility] - SURG TELEMETRY STEP-DOWN   Patient Name: Walter West   Attending Physician: Clabe Seal, MD   Today's date:   05/04/2020 LOS: 1 days   Expected Discharge Date      Readmission Assessment:                                                              Discharge Planning  ReAdmit Risk Score: 8  Does the patient have perscription coverage?: No  CM Comments: 12/21 RNCM SM: adm wtih L CVA with hemorrhagic component. Uninsured, no PCP. Pt agreeable to medassist referral and transitional care appointment. Ind PTA, drives, lives with girlfriend. Pt agreeable to have meds sent to [facility] at [employer], may need indigent meds depending on cost. Plan is for [address] [place] to home with girlfriend support/transport. CM following.       IDPA:      Healthcare Decisions  Interviewed:: Patient  Orientation/Decision Making Abilities of Patient: Alert and Oriented x3, able to make decisions  Advance Directive: Patient does not have advance directive  Prior to admission  Prior level of function: Independent with ADLs,Ambulates independently  Type of Residence: Private residence  Home Layout: One level,Stairs to enter with rails (add number in comment) (2 STE)  Have running water, electricity, heat, etc?: Yes  Living Arrangements: Spouse/significant other  How do you get to your MD appointments?: doesn't go to the MD  How do you get your groceries?: self or girlfriend  Who fixes your meals?: self  Who does your laundry?: girlfriend  Who picks up your prescriptions?: no medications  Dressing: Independent  Grooming: Independent  Feeding: Independent  Bathing: Independent  Toileting: Independent  Discharge Planning  Mode of transportation:: Private car (family member)  Does the patient have perscription coverage?: No         30 Day Readmission:       Provider Notifications:        Massie Bougie RN BSN [facility]  Case Manager  [phone]

## 2020-05-04 NOTE — Discharge Instr - AVS First Page (Signed)
Have your PCP repeat your CMP and CBC . He can order these at your follow up visit.

## 2020-05-04 NOTE — SLP Eval Note (Signed)
[facility]  Speech Language Pathology   Speech/Language and Swallowing Evaluation    Patient:  Walter West     CSN#: [id number]  Referring Physician: Ilda Mori, MD  Date of Referral: 05/03/20    REASON FOR REFERRAL:   Walter West is a 59 y.o. male admitted 05/02/2020 for CVA (cerebral vascular accident) [I63.9]  Cerebral infarction, unspecified mechanism [I63.9] was referred for SLP Evaluation and Treatment for "slurred speech".    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    Diet Recommendations:  Regular diet/Thin liquids    Administration of Medications: No restrictions; patient may take meds per their preference/tolerance    Precautions/Compensations:  Position patient upright 90 degrees for all po intake/meds, Alternate liquids and solids (liquid wash), Take small bites/sips, Eat/feed slowly, Aspiration precautions, Reflux precautions  Suggestions for Feeding:  Independent  Prognosis:  Fair d/t and patient verbalized limited interest in addressing deficits  Aspiration Risk: cognitive deficits/confusion, recent CVA  Instrumental Swallow Study recommended: no;Not indicated at this time  SLP Follow-Up Inpatient Treatment needs: 3x/wk, pt/family training, swallowing tx, speech/language tx, cognitive-communication tx  Duration of Treatment: LOS  Projected Discharge Recommendations:  Patient will need ongoing SLP at next level of care, Outpatient SLP- Patient has currently declined OP services but verbalized will consider it if deficits continue after d/c from hospital.  Other referrals:  n/a    Goals:   STG:  (in 3-4 days)  1. Pt/family education (NEW)  2. Pt will tolerate a REGULAR SOLID diet with THIN liquids free of signs/symptoms of aspiration. (MET)  3. Pt  will participate in simple conversation with all listeners with 80% success. (NEW)  LTG:  (By discharge)  1. Pt will be MIN assist for safety and judgement  (NEW)  2. Pt will communicate simple wants, needs, ideas to ALL listeners via total communication with 80% success given MIN assist. (NEW)  3. Patient will tolerate the least restrictive diet free of risk of aspiration (MET)      History of present illness:     Medical Diagnosis: CVA (cerebral vascular accident) [I63.9]  Cerebral infarction, unspecified mechanism [I63.9]    History of Present Illness:  Walter West is a 59 y.o. male admitted on 05/02/2020 with   Patient Active Problem List   Diagnosis    CVA (cerebral vascular accident)        Past Medical/Surgical History:  Past Medical History:   Diagnosis Date    Diabetes mellitus       Past Surgical History:   Procedure Laterality Date    APPENDECTOMY (OPEN)  2011    HERNIA REPAIR           Social History: Worked full-time, Independent w/ ADL's    Baseline cognitive-communication/swallowing status:  Verbalizes some reading and writing deficits at baseline.      SUBJECTIVE:     Patient is agreeable to participation in the therapy session. Nursing clears patient for therapy. Patients medical condition is appropriate for Speech therapy intervention at this time.    "I am not trying to be rude or mean, I am just looking to go home"      OBJECTIVE:     Tests Administered/Score:  Pt is unable to participate in standardized assessment d/t refusal and agitation with the same, Bedside swallow evaluation, language  and cognitive linguistic skills tested using subtests from standardized tools along with informal measures.    AUDITORY COMPREHENSION:  Yes/no:  Not impaired  Open-ended questions:  Not impaired, simple level  Object ID:  Not impaired  Following directions:  Not impaired, simple level  Performance impacted by:  impaired attention    EXPRESSIVE LANGUAGE:    Confrontation Naming:  80% acc,  substitutions that are often self corrected. Patient needs 2-3 attempts to self correct and identify the target word.  Generative Naming:  Not assessed.  Repetition:  Did not participate  Picture description:  not tested  Conversation:  Pt with fluent verbal output characterized by halting/hesitant, anomic hesitations  Ability to make needs known: Mild-moderate deficits due to reduced attention, topic maintenance, word finding difficulty, substitutions and frustration resulting from the same.     Non-verbal Communication:  Intonation pattern:  Intonation pattern with semblance to normal [place] syntax and phonology  Facial expression:  Facial expression able to convey emotions and limited information at simple level.    Written Expression:  Not assessed    Reading Comprehension:  Not assessed    MOTOR SPEECH/VOICE:  Pt with vocal quality and articulation WNL.    COGNITIVE-COMMUNICATION:   Orientation:  Pt is Ox4  Attention:  poor eye contact, high distractibility, impulsive behavior  Memory:  decreased performance with increased length and complexity of information presented  Problem solving:  Mild-moderate deficits  Insight:  Poor  Safety:  impulsivity, lack of insight, agitation  Fall risk:  impaired safety judgement/problem solving., impulsivity, lack of insight, visual impairment, agitation  Supervision needs:  intermittent supervision     SWALLOWING:  Observation of Patient/Vital Signs:    Positioning: Patient is upright in bed.  Respiratory status: Room air  Source of nutrition at time of eval:  Regular diet, thin liquids  Other medical equipment in place: IV     Oromotor skills:  Dentition: WFL for mastication  Strength:  WFL  Coordination:  WFL  ROM: WFL  Symmetry:  WFL   Oral Apraxia:  N/A  Sensation:  WFL  Gag Reflex:  Not assessed  Laryngeal Elev:  WFL, Timely initiation of the swallow reflex  Cough/Grunt:  WFL  Motor Speech/Voice:  WFL    Consistencies presented:  ICE CHIPS: 1/2 tsp x1  No signs/symptoms  of aspiration, no change in respiratory status, no vocal change, timely initiation of the swallow reflex  THIN LIQUID: cup sip x3 , straw sip x5   No signs/symptoms of aspiration, no change in respiratory status, no vocal change, timely initiation of the swallow reflex  PUREED: Refused   MECHANICAL SOFT:  1/2 tsp x1, 1 tsp x1   No signs/symptoms of aspiration, no change in respiratory status, no vocal change, timely initiation of the swallow reflex  REGULAR: 1/2 tsp x1, 1 tsp x1   No signs/symptoms of aspiration, no change in respiratory status, no vocal change, timely initiation of the swallow reflex    Pt/Family/Caregiver Education Provided:     Patient was/were educated re: role of slp, purpose of evaluation, sign/symptoms/risks of aspiration, compensatory communication strategies (reduce distractions, use slowed rate of speech, functional circumlocution ("describe it"), use another word that means the same), safe feeding strategies, results/recommendations of today's evaluation, tx goals  Good understanding was verbalized/demonstrated: yes  Comprehension limited by: poor insight    Team Communication:     Spoke to : RN/LPN - Wendy-RN  Regarding: safe feeding strategies, results/recommendations of today's evaluation, tx goals  Good  understanding was verbalized: yes      Time of treatment:      SLP Received On: 05/04/20  Start Time: 1138  Stop Time: 1213  Time Calculation (min): 35 min    SLP Visit Number: 1                 Evaluation completed by:  Melynda Keller., CCC-SLP  Speech-Language Pathologist, Shanon Ace  [facility]  Contact via [place], [place] Phone [phone], or Pager [phone]

## 2020-05-06 LAB — VH CARDIOLIPIN ANTIBODY PANEL #2
Cardiolipin IgA Antibody: <2 APL-U/mL (ref <20.0)
Cardiolipin IgG Antibody: <2 GPL-U/mL (ref <20.0)
Cardiolipin IgM Antibody: <2 MPL-U/mL (ref <20.0)

## 2020-05-06 LAB — PROTEIN C ANTIGEN: Protein C Antigen: >150 % — ABNORMAL HIGH (ref 70–140)

## 2020-05-06 LAB — PROTEIN S ANTIGEN, TOTAL: Protein S Antigen Total: 153 % normal — ABNORMAL HIGH (ref 70–140)

## 2020-05-07 LAB — SS-A , SS-B (SJOGREN'S)
Sjogren's Antibody (SS-A): <1
Sjogren's Antibody (SS-B): <1

## 2020-05-09 LAB — ANA SCREEN, IFA, WITH REFLEX TO TITER AND PATTERN: ANA Screen, IFA: NEGATIVE

## 2020-05-09 LAB — FACTOR V LEIDEN: Factor V (Leiden) Mutation: NEGATIVE

## 2020-05-10 LAB — PROTHROMBIN GENE MUTATION: Prothrombin (Factor II): NEGATIVE

## 2020-05-14 LAB — LUPUS ANTICOAGULANT EVAL W-RFL

## 2020-06-20 ENCOUNTER — Emergency Department: Payer: Self-pay

## 2020-06-20 ENCOUNTER — Emergency Department
Admission: EM | Admit: 2020-06-20 | Discharge: 2020-06-20 | Disposition: A | Discharge status: Home / Self Care | Payer: Commercial Managed Care - POS | Attending: Emergency Medicine | Admitting: Emergency Medicine

## 2020-06-20 DIAGNOSIS — Z8673 Personal history of transient ischemic attack (TIA), and cerebral infarction without residual deficits: Secondary | ICD-10-CM | POA: Insufficient documentation

## 2020-06-20 DIAGNOSIS — E119 Type 2 diabetes mellitus without complications: Secondary | ICD-10-CM | POA: Insufficient documentation

## 2020-06-20 DIAGNOSIS — Z9119 Patient's noncompliance with other medical treatment and regimen: Secondary | ICD-10-CM | POA: Insufficient documentation

## 2020-06-20 DIAGNOSIS — R079 Chest pain, unspecified: Secondary | ICD-10-CM

## 2020-06-20 DIAGNOSIS — R072 Precordial pain: Secondary | ICD-10-CM | POA: Insufficient documentation

## 2020-06-20 DIAGNOSIS — Z91199 Patient's noncompliance with other medical treatment and regimen due to unspecified reason: Secondary | ICD-10-CM

## 2020-06-20 HISTORY — DX: Essential (primary) hypertension: I10

## 2020-06-20 HISTORY — DX: Other stimulant abuse, uncomplicated: F15.10

## 2020-06-20 HISTORY — DX: Cerebral infarction, unspecified: I63.9

## 2020-06-20 LAB — ECG 12-LEAD
P Wave Axis: 72 deg
P-R Interval: 128 ms
Patient Age: 60 years
Q-T Interval(Corrected): 446 ms
Q-T Interval: 310 ms
QRS Axis: 59 deg
QRS Duration: 96 ms
T Axis: 41 years
Ventricular Rate: 124 //min

## 2020-06-20 LAB — COMPREHENSIVE METABOLIC PANEL
ALT: 14 U/L (ref 0–55)
AST (SGOT): 16 U/L (ref 10–42)
Albumin/Globulin Ratio: 1.29 Ratio (ref 0.80–2.00)
Albumin: 4 gm/dL (ref 3.5–5.0)
Alkaline Phosphatase: 113 U/L (ref 40–145)
Anion Gap: 13.6 mMol/L (ref 7.0–18.0)
BUN / Creatinine Ratio: 10.8 Ratio (ref 10.0–30.0)
BUN: 12 mg/dL (ref 7–22)
Bilirubin, Total: 0.4 mg/dL (ref 0.1–1.2)
CO2: 26 mMol/L (ref 20–30)
Calcium: 9.4 mg/dL (ref 8.5–10.5)
Chloride: 100 mMol/L (ref 98–110)
Creatinine: 1.11 mg/dL (ref 0.80–1.30)
EGFR: 73 mL/min/{1.73_m2} (ref 60–150)
Globulin: 3.1 gm/dL (ref 2.0–4.0)
Glucose: 431 mg/dL — ABNORMAL HIGH (ref 71–99)
Osmolality Calculated: 288 mOsm/kg (ref 275–300)
Potassium: 4.6 mMol/L (ref 3.5–5.3)
Protein, Total: 7.1 gm/dL (ref 6.0–8.3)
Sodium: 135 mMol/L — ABNORMAL LOW (ref 136–147)

## 2020-06-20 LAB — CBC AND DIFFERENTIAL
Basophils %: 1.5 % (ref 0.0–3.0)
Basophils Absolute: 0.1 10*3/uL (ref 0.0–0.3)
Eosinophils %: 2.7 % (ref 0.0–7.0)
Eosinophils Absolute: 0.2 10*3/uL (ref 0.0–0.8)
Hematocrit: 37.8 % — ABNORMAL LOW (ref 39.0–52.5)
Hemoglobin: 13.5 gm/dL (ref 13.0–17.5)
Lymphocytes Absolute: 2.1 10*3/uL (ref 0.6–5.1)
Lymphocytes: 32.9 % (ref 15.0–46.0)
MCH: 29 pg (ref 28–35)
MCHC: 36 gm/dL (ref 32–36)
MCV: 82 fL (ref 80–100)
MPV: 7.7 fL (ref 6.0–10.0)
Monocytes Absolute: 0.4 10*3/uL (ref 0.1–1.7)
Monocytes: 6.1 % (ref 3.0–15.0)
Neutrophils %: 56.9 % (ref 42.0–78.0)
Neutrophils Absolute: 3.6 10*3/uL (ref 1.7–8.6)
PLT CT: 305 10*3/uL (ref 130–440)
RBC: 4.63 10*6/uL (ref 4.00–5.70)
RDW: 11.4 % (ref 11.0–14.0)
WBC: 6.4 10*3/uL (ref 4.0–11.0)

## 2020-06-20 LAB — VH DEXTROSE STICK GLUCOSE: Glucose POCT: 290 mg/dL — ABNORMAL HIGH (ref 71–99)

## 2020-06-20 LAB — VH I-STAT TROPONIN NOTIFICATION

## 2020-06-20 LAB — D-DIMER, QUANTITATIVE: D-Dimer: 0.71 mg/L FEU — ABNORMAL HIGH (ref 0.19–0.52)

## 2020-06-20 LAB — TROPONIN I: Troponin I: <0.01 ng/mL (ref 0.00–0.02)

## 2020-06-20 LAB — I-STAT TROPONIN: Troponin I I-Stat: <0.02 ng/mL (ref 0.00–0.08)

## 2020-06-20 MED ORDER — ASPIRIN 81 MG PO CHEW
CHEWABLE_TABLET | ORAL | Status: AC
Start: 2020-06-20 — End: ?
  Filled 2020-06-20: qty 4

## 2020-06-20 MED ORDER — METFORMIN HCL 500 MG PO TABS
500.0000 mg | ORAL_TABLET | Freq: Two times a day (BID) | ORAL | 0 refills | Status: AC
Start: 2020-06-20 — End: ?

## 2020-06-20 MED ORDER — ATENOLOL 25 MG PO TABS
12.5000 mg | ORAL_TABLET | Freq: Every day | ORAL | 0 refills | Status: AC
Start: 2020-06-20 — End: ?

## 2020-06-20 MED ORDER — SODIUM CHLORIDE 0.9 % IV BOLUS
1000.0000 mL | Freq: Once | INTRAVENOUS | Status: AC
Start: 2020-06-20 — End: 2020-06-20
  Administered 2020-06-20: 20:00:00 1000 mL via INTRAVENOUS

## 2020-06-20 MED ORDER — ASPIRIN 81 MG PO CHEW
324.0000 mg | CHEWABLE_TABLET | Freq: Once | ORAL | Status: AC
Start: 2020-06-20 — End: 2020-06-20
  Administered 2020-06-20: 19:00:00 324 mg via ORAL

## 2020-06-20 MED ORDER — ASPIRIN 81 MG PO CHEW
81.0000 mg | CHEWABLE_TABLET | Freq: Every day | ORAL | 0 refills | Status: AC
Start: 2020-06-20 — End: ?

## 2020-06-20 MED ORDER — NITROGLYCERIN 0.4 MG SL SUBL
SUBLINGUAL_TABLET | SUBLINGUAL | Status: AC
Start: 2020-06-20 — End: ?
  Filled 2020-06-20: qty 25

## 2020-06-20 MED ORDER — GLIPIZIDE 10 MG PO TABS
10.0000 mg | ORAL_TABLET | Freq: Every morning | ORAL | 0 refills | Status: AC
Start: 2020-06-20 — End: ?

## 2020-06-20 MED ORDER — IOHEXOL 350 MG/ML IV SOLN
65.0000 mL | Freq: Once | INTRAVENOUS | Status: AC | PRN
Start: 2020-06-20 — End: 2020-06-20
  Administered 2020-06-20: 20:00:00 65 mL via INTRAVENOUS

## 2020-06-20 MED ORDER — ATORVASTATIN CALCIUM 80 MG PO TABS
80.0000 mg | ORAL_TABLET | Freq: Every evening | ORAL | 0 refills | Status: AC
Start: 2020-06-20 — End: ?

## 2020-06-20 MED ORDER — AMLODIPINE BESYLATE 5 MG PO TABS
5.0000 mg | ORAL_TABLET | Freq: Every day | ORAL | 0 refills | Status: AC
Start: 2020-06-20 — End: ?

## 2020-06-20 MED ORDER — NITROGLYCERIN 0.4 MG SL SUBL
0.4000 mg | SUBLINGUAL_TABLET | SUBLINGUAL | Status: DC | PRN
Start: 2020-06-20 — End: 2020-06-21
  Administered 2020-06-20 (×2): 0.4 mg via SUBLINGUAL

## 2020-06-20 NOTE — Discharge Instructions (Addendum)
As discussed, your sugar was elevated, but improved after IV fluids. Your EKG and heart tests were reassuring. The CT scan of your chest showed no evidence of blood clot in your lungs. After discussion, you are being discharged home.     IT IS IMPERATIVE YOU TAKE YOUR MEDICATIONS THAT WERE PRESCRIBED AFTER YOUR STROKE AS YOUR BLOOD PRESSURE WAS VERY HIGH ON ARRIVAL. THE PRESCRIPTIONS HAVE BEEN SENT TO [facility]. PICK THESE UP AND TAKE AS PRESCRIBED.    You have been referred to the [facility] ([facility]) for re-evaluation and to aid in establishing care with a Primary Care Physician. Please call today to be seen in 3-5 days or sooner as needed. Return for worsening symptoms or other concerns.

## 2020-06-20 NOTE — ED Triage Notes (Signed)
Pt presents with c/o sudden onset of midsternal/right sided sharp chest pain that started 2 hours ago, reports it felt like someone was stabbing him and then the stabbing went away and now just has a constant aching. Pt also reports he has been having a " right sided headache for a few weeks", pt reports " the headache is better if I keep myself busy or sleep raised up and worse if I rest or lye flat", pt also endorses intermittent blurred vision in both eyes x few weeks, associated with the headache. Pt reports he also had dizziness/spinning sensation with fast movement/rolling over in bed/turning his head side to side. Denies any numbness or tingling. Denies SOB. Pt reports he has drank " in a few weeks", when asked how much he drank reports " a bottle of wine here and there not every day". Difficult to obtain pt hx. Pt recently had a stroke in dec 2021, pt admits he hasnt been taking his medications since " being discharged from here" in dec. Endorses " my girlfriend broke up with me recently and moved next door with a new guy", pt denies SI or HI.

## 2020-06-20 NOTE — ED Notes (Signed)
This nurse reviewed the discharge instructions with the pt. Pt was instructed to follow up with PCP. multiple rxs or work note included. IV was removed and final set of VS obtained. Pt verbalized understanding. At this time pt has no questions or concerns. Pt walked off unit.

## 2020-06-20 NOTE — ED Provider Notes (Signed)
Patient Name: Walter West, Walter West  Encounter Date:  06/20/2020  Attending Physician: Oretha Ellis, M.D.   Room:  N1/N1-A  Patient DOB:  1961-03-10  Age: 60 y.o. male  MRN:  [id number]  PCP: Oneita Hurt None, MD     HISTORY     HPI     Chief Complaint   Patient presents with    Chest Pain     Patient information was obtained from patient.  History/Exam limitations: none.    Walter West is a 60 y.o. male h/o subacute ischemic stroke (05/02/20) and diabetes who presents for evaluation of sudden onset of mid sternum chest pain ~2 hours prior to ED arrival. Pt reports he was getting out of his recliner during onset of pain. Describes pain as severe, sharp, and stabbing. Denies pain radiating else where. Denies any relieving factors, and states pain is still present. He reports intermittent headaches due to living situation complications with his partner who recently moved next door to him. He also reports "thumping" in his finger tips. Denies finger tip numbness or tingling.He denies fever/chills, dizziness, congestion, cough, SOB, abdominal pain, n/v/d, and dysuria.    Review of Systems   Review of Systems   Constitutional: Negative for chills and fever.   HENT: Negative for congestion, rhinorrhea and sore throat.    Eyes: Negative for pain and visual disturbance.   Respiratory: Negative for cough and shortness of breath.    Cardiovascular: Positive for chest pain. Negative for leg swelling.   Gastrointestinal: Negative for abdominal pain, diarrhea, nausea and vomiting.   Genitourinary: Negative for dysuria and flank pain.   Musculoskeletal: Negative for joint swelling and myalgias.   Skin: Negative for rash.   Neurological: Positive for headaches. Negative for dizziness and syncope.        As per HPI.     History     ALLERGIES: Patient has no known allergies.    PMHX: Patient has a past medical history of Diabetes mellitus.    PSHX: Patient has a past surgical history that includes Hernia repair and APPENDECTOMY  (OPEN) (2011).     SOCHX: Patient reports that he has never smoked. He has never used smokeless tobacco. He reports previous alcohol use. He reports previous drug use.     FAMHX: Patient's family history is not on file.    LISTED MEDICATIONS ON ARRIVAL:  Current Outpatient Medications   Medication Instructions    amLODIPine (NORVASC) 5 mg, Oral, Daily    aspirin 81 mg, Oral, Daily    atenolol (TENORMIN) 12.5 mg, Oral, Daily    atorvastatin (LIPITOR) 80 mg, Oral, At bedtime    glipiZIDE (GLUCOTROL) 10 mg, Oral, Every morning before breakfast    metFORMIN (GLUCOPHAGE) 500 mg, Oral, 2 times daily with meals, Start tomorrow dinner time        VISIT INFORMATION     Physical Exam    TRIAGE VS: T 98.3 F (36.8 C)    HR (!) 129    BP (!) 182/118    RR 18    SpO2 96 % on   None (Room air)    Wt 67.9 kg    BMI 27.4  SpO2 interpretation: normal. Intervention: None Needed    Physical Exam  Vitals and nursing note reviewed.   Constitutional:       Appearance: Normal appearance.   HENT:      Head: Normocephalic and atraumatic.      Right Ear: External ear normal.  Left Ear: External ear normal.      Nose: Nose normal. No congestion.      Mouth/Throat:      Mouth: Mucous membranes are moist.      Pharynx: No posterior oropharyngeal erythema.   Eyes:      General: No scleral icterus.     Conjunctiva/sclera: Conjunctivae normal.   Cardiovascular:      Rate and Rhythm: Regular rhythm. Tachycardia present.      Pulses: Normal pulses.      Heart sounds: Normal heart sounds.   Pulmonary:      Effort: Pulmonary effort is normal.      Breath sounds: Normal breath sounds.   Abdominal:      General: Abdomen is flat.      Palpations: Abdomen is soft.   Musculoskeletal:         General: No deformity. Normal range of motion.      Cervical back: Normal range of motion and neck supple. No tenderness.   Skin:     General: Skin is warm.      Findings: No erythema.   Neurological:      General: No focal deficit present.      Mental  Status: He is alert and oriented to person, place, and time.   Psychiatric:         Mood and Affect: Mood normal.         Behavior: Behavior normal.          ED EKG   I visualized and interpreted the EKG. My formal interpretation is documented below.    Last EKG Result     Procedure Component Value Units Date/Time    ECG 12 lead [[id number]] Collected: 06/20/20 1806     Updated: 06/20/20 1824     Patient Age 41 years      Patient DOB [birth year]     Patient Height --     Patient Weight --     Interpretation Text --     Sinus tachycardia  Baseline wander in lead(s) II  Compared to ECG 05/02/2020 23:23:56  No significant changes  Electronically Signed On 06-20-2020 18:23:54 EST by Bernadette Hoit       Physician Interpreter Bernadette Hoit     Ventricular Rate 124 //min      QRS Duration 96 ms      P-R Interval 128 ms      Q-T Interval 310 ms      Q-T Interval(Corrected) 446 ms      P Wave Axis 72 deg      QRS Axis 59 deg      T Axis 41 years            ED Labs   Abnormal Labs Reviewed - No data to display     ED Radiology   No results found.     Medications Administered in ED   Medications - No data to display     Vital Signs in the ED   Patient Vitals for the past 720 hrs:   Temp Pulse BP Resp SpO2 O2 Device   06/20/20 1803 98.3 F (36.8 C) (!) 129 (!) 182/118 18 96 % None (Room air)   06/20/20 1828  (!) 123 (!) 170/111 20 99 %    06/20/20 1831  (!) 118 159/88 20 99 % None (Room air)   06/20/20 1847  (!) 126 (!) 154/107 20 98 % None (Room air)   06/20/20 1854  (!) 121  135/86 18 98 %    06/20/20 1859  (!) 124 135/86 (!) 24 98 % None (Room air)   06/20/20 1901  (!) 120 (!) 140/94 20 98 %    06/20/20 1930  (!) 116 143/85 19 98 %    06/20/20 2100  (!) 111 (!) 160/93 13 97 %    06/20/20 2200 98 F (36.7 C) (!) 103 155/83 21 98 %         ED Procedures   Procedures  MDM/DISPO     MDM     Patient is a 60 y.o. male h/o subacute ischemic stroke (05/02/20) and diabetes who presents for evaluation of sudden onset of mid  sternum chest pain ~2 hours prior to ED arrival.     DDX: The differential diagnosis includes, but is not limited to acute coronary syndrome, unstable angina, pericarditis, myocarditis, endocarditis, cardiac tamponade, PTX, Boerhaave syndrome, aortic dissection, pulmonary embolism, CHF, atrial fibrillation/ flutter, ventricular dysrhythmia, chest wall pain, costochondritis, myofascial strain, viral syndrome, pneumonia, bronchitis, pleurisy, biliary disorder, GERD, abnormal EKG, COVID-19    ED Course   ED Course as of 06/28/20 0020   Sun Jun 20, 2020   1850 CBC and differential(!)  -reassuring [AM]   1911 Comprehensive metabolic panel(!)  -elevated glucose, remainder reassuring. Will give IVF [AM]   1911 CT Head WO Contrast  -No acute abnormality. Chronic infarcts unchanged from prior CT dated 05/03/2020 [AM]   1913 Glucose(!): 431  -Giving IVF [AM]   1931 D-Dimer(!): 0.71  -Elevated D-dimer. CTA-chest PE protocol ordered and pending [AM]   2037 CT Angiogram Chest (PE)  -Mild bibasilar atelectasis, no other acute findings [AM]   2121 -Repeat FSBG after IVF 290 [AM]   2151 Troponin I I-Stat: <0.02 [AM]   2151 Glucose POCT(!): 290  -On reassessment, patient's chest pain is resolved.  -Patient has not filled his meds from his recent hospital admission and is not currently taking any medications. I discussed the importance of these medications and discussed his abnormal labs today including his glucose of 431.  -Review of his discharge paperwork from his admission, prescriptions were sent to [facility] pharmacy which he did not pick up. He states that since he had the stroke he he is unable to drive per [facility] regulations.   -I prescribed all medications he was written for at discharge and sent them to the pharmacy close to his home. He states he can easily get there to pick them up and assures me he will do just that.   -strict return precautions discussed and understood [AM]      ED Course User Index  [AM] Oretha Ellis,  MD        Heart Score Calculator    0 points 1 point 2 points    History Slightly suspicious Moderately suspicious Highly suspicious 0   EKG Normal Non-specific repolarization disturbance Significant ST depression 0   Age (years) <45 57 ?65 1   Risk factors* No known risk factors 12 risk factors ?3 risk factors or history of atherosclerotic disease 2   Initial troponin ?normal limit 13 normal limit >3 normal limit 0      Total Score 3     Based on the patient's historical features, diagnostic results, in addition to the patient's reassuring physical exam, I see no evidence at this time for a malignant etiology for the patient's presentation. Life-threatening or serious conditions as a result of or as the cause of the patient's presentation were considered,  but were thought less likely. There is no evidence of ACS, PE, PTX, Aortic catastrophe, pneumonia. Based on his reassuring clinical appearance, history, physical exam, extensive workup in the ED, diagnostic results, improvement of symptoms and my best clinical judgment at this time, I feel he is a good candidate for outpatient symptomatic therapy and early outpatient f/u with strict return precautions.     I had an in-depth discussion and shared decision making with the patient regarding the historical points, exam findings, diagnostic results and my clinical judgement supporting d/c at this time. He agrees with discharge at this time. He understands that at this time there is no evidence for a more malignant underlying process, but also understands that early in the process of an illness, an initial workup can be falsely reassuring. Additionally, he understands that his work up today indicates that while no acute, emergent condition exists requiring immediate treatment, occasionally a final diagnosis may not have been established as some conditions require further outpatient workup to determine definitive diagnosis.     I have discussed the need for close  outpatient follow up and the need to return to the ED if symptoms worsen or the patient has any other concerns. He assures me he will follow up with the [facility] transition center as discussed and/or return to the ED as needed. All questions were answered and concerns addressed. More extensive discharge instructions have been provided in the patient's discharge paperwork. Patient appreciative of care.                  Diagnosis:     Final diagnoses:   Chest pain in adult   History of CVA (cerebrovascular accident)   Type 2 diabetes mellitus without complication, without long-term current use of insulin   Medically noncompliant     Disposition     ED Disposition     ED Disposition Condition Date/Time Comment    Discharge  Sun Jun 20, 2020 10:01 PM Silvestre Gunner discharge to home/self care.    Condition at disposition: Stable             Discharge Medication List      Taking    amLODIPine 5 MG tablet  Dose: 5 mg  Commonly known as: NORVASC  Take 1 tablet (5 mg total) by mouth daily     aspirin 81 MG chewable tablet  Dose: 81 mg  Chew 1 tablet (81 mg total) by mouth daily     atenolol 25 MG tablet  Dose: 12.5 mg  Commonly known as: TENORMIN  Take 0.5 tablets (12.5 mg total) by mouth daily     atorvastatin 80 MG tablet  Dose: 80 mg  Commonly known as: LIPITOR  Take 1 tablet (80 mg total) by mouth nightly     glipiZIDE 10 MG tablet  Dose: 10 mg  Commonly known as: GLUCOTROL  Take 1 tablet (10 mg total) by mouth every morning before breakfast     metFORMIN 500 MG tablet  Dose: 500 mg  Commonly known as: GLUCOPHAGE  Take 1 tablet (500 mg total) by mouth 2 (two) times daily with meals Start tomorrow dinner time          Follow Up       Follow-up Information     [facility].    Specialty: Internal Medicine  Why: 3-5 days or sooner if needed  Contact information:  [address]  [place] [address]  [phone]  [facility].    Specialty: Emergency Medicine  Why: As  needed, If symptoms worsen  Contact information:  [address]  [place] [address]  [phone]                     Scribe Attestation   The documentation recorded by my scribe, Tonna Corner, accurately reflects the services I personally performed and the decisions made by me Bernadette Hoit, MD     *This note was generated by the Epic EMR system/ Dragon speech recognition and may contain inherent errors or omissions not intended by the user. Grammatical errors, random word insertions, deletions, pronoun errors and incomplete sentences are occasional consequences of this technology due to software limitations. Not all errors are caught or corrected. If there are questions or concerns about the content of this note or information contained within the body of this dictation they should be addressed directly with the author for clarification.        Oretha Ellis, MD  06/28/20 Rich Fuchs

## 2020-09-16 ENCOUNTER — Emergency Department: Payer: Commercial Managed Care - POS

## 2020-09-16 ENCOUNTER — Observation Stay
Admission: EM | Admit: 2020-09-16 | Discharge: 2020-09-18 | Disposition: A | Discharge status: Home / Self Care | Payer: Commercial Managed Care - POS | Attending: Internal Medicine | Admitting: Internal Medicine

## 2020-09-16 DIAGNOSIS — Z8673 Personal history of transient ischemic attack (TIA), and cerebral infarction without residual deficits: Secondary | ICD-10-CM | POA: Insufficient documentation

## 2020-09-16 DIAGNOSIS — I2510 Atherosclerotic heart disease of native coronary artery without angina pectoris: Secondary | ICD-10-CM | POA: Insufficient documentation

## 2020-09-16 DIAGNOSIS — R4182 Altered mental status, unspecified: Secondary | ICD-10-CM

## 2020-09-16 DIAGNOSIS — E785 Hyperlipidemia, unspecified: Secondary | ICD-10-CM | POA: Insufficient documentation

## 2020-09-16 DIAGNOSIS — Z7984 Long term (current) use of oral hypoglycemic drugs: Secondary | ICD-10-CM | POA: Insufficient documentation

## 2020-09-16 DIAGNOSIS — E1165 Type 2 diabetes mellitus with hyperglycemia: Secondary | ICD-10-CM | POA: Insufficient documentation

## 2020-09-16 DIAGNOSIS — Z7982 Long term (current) use of aspirin: Secondary | ICD-10-CM | POA: Insufficient documentation

## 2020-09-16 DIAGNOSIS — Z79899 Other long term (current) drug therapy: Secondary | ICD-10-CM | POA: Insufficient documentation

## 2020-09-16 DIAGNOSIS — Z9049 Acquired absence of other specified parts of digestive tract: Secondary | ICD-10-CM | POA: Insufficient documentation

## 2020-09-16 DIAGNOSIS — R451 Restlessness and agitation: Secondary | ICD-10-CM | POA: Insufficient documentation

## 2020-09-16 DIAGNOSIS — I10 Essential (primary) hypertension: Secondary | ICD-10-CM | POA: Insufficient documentation

## 2020-09-16 DIAGNOSIS — Z5329 Procedure and treatment not carried out because of patient's decision for other reasons: Secondary | ICD-10-CM

## 2020-09-16 DIAGNOSIS — F1910 Other psychoactive substance abuse, uncomplicated: Principal | ICD-10-CM | POA: Insufficient documentation

## 2020-09-16 HISTORY — DX: Cocaine abuse, uncomplicated: F14.10

## 2020-09-16 HISTORY — DX: Opioid dependence, uncomplicated: F11.20

## 2020-09-16 HISTORY — DX: Other psychoactive substance dependence, uncomplicated: F19.20

## 2020-09-16 HISTORY — DX: Patient's noncompliance with other medical treatment and regimen due to unspecified reason: Z91.199

## 2020-09-16 HISTORY — DX: Patient's noncompliance with other medical treatment and regimen: Z91.19

## 2020-09-16 LAB — ETHANOL: Alcohol: <10 mg/dL (ref 0–9)

## 2020-09-16 LAB — COMPREHENSIVE METABOLIC PANEL
ALT: 11 U/L (ref 0–55)
AST (SGOT): 13 U/L (ref 10–42)
Albumin/Globulin Ratio: 1.06 Ratio (ref 0.80–2.00)
Albumin: 3.4 gm/dL — ABNORMAL LOW (ref 3.5–5.0)
Alkaline Phosphatase: 104 U/L (ref 40–145)
Anion Gap: 16.6 mMol/L (ref 7.0–18.0)
BUN / Creatinine Ratio: 19.5 Ratio (ref 10.0–30.0)
BUN: 30 mg/dL — ABNORMAL HIGH (ref 7–22)
Bilirubin, Total: 0.8 mg/dL (ref 0.1–1.2)
CO2: 22 mMol/L (ref 20–30)
Calcium: 8.9 mg/dL (ref 8.5–10.5)
Chloride: 102 mMol/L (ref 98–110)
Creatinine: 1.54 mg/dL — ABNORMAL HIGH (ref 0.80–1.30)
EGFR: 52 mL/min/{1.73_m2} — ABNORMAL LOW (ref 60–150)
Globulin: 3.2 gm/dL (ref 2.0–4.0)
Glucose: 340 mg/dL — ABNORMAL HIGH (ref 71–99)
Osmolality Calculated: 293 mOsm/kg (ref 275–300)
Potassium: 3.6 mMol/L (ref 3.5–5.3)
Protein, Total: 6.6 gm/dL (ref 6.0–8.3)
Sodium: 137 mMol/L (ref 136–147)

## 2020-09-16 LAB — ECG 12-LEAD
P Wave Axis: 78 deg
P-R Interval: 127 ms
Patient Age: 60 years
Q-T Interval(Corrected): 500 ms
Q-T Interval: 361 ms
QRS Axis: 78 deg
QRS Duration: 92 ms
T Axis: 57 years
Ventricular Rate: 115 //min

## 2020-09-16 LAB — PT AND APTT
PT INR: 1 (ref 0.9–1.1)
PT: 10.6 s (ref 9.4–11.5)
aPTT: 24 s (ref 24.0–34.0)

## 2020-09-16 LAB — ACETAMINOPHEN LEVEL: Acetaminophen Level: <0.6 ug/mL (ref 0.0–30.0)

## 2020-09-16 LAB — CBC AND DIFFERENTIAL
Basophils %: 0.4 % (ref 0.0–3.0)
Basophils Absolute: 0 10*3/uL (ref 0.0–0.3)
Eosinophils %: 1.2 % (ref 0.0–7.0)
Eosinophils Absolute: 0.1 10*3/uL (ref 0.0–0.8)
Hematocrit: 36.9 % — ABNORMAL LOW (ref 39.0–52.5)
Hemoglobin: 12.7 gm/dL — ABNORMAL LOW (ref 13.0–17.5)
Lymphocytes Absolute: 1.6 10*3/uL (ref 0.6–5.1)
Lymphocytes: 14.7 % — ABNORMAL LOW (ref 15.0–46.0)
MCH: 30 pg (ref 28–35)
MCHC: 35 gm/dL (ref 32–36)
MCV: 86 fL (ref 80–100)
MPV: 7.1 fL (ref 6.0–10.0)
Monocytes Absolute: 0.8 10*3/uL (ref 0.1–1.7)
Monocytes: 7.9 % (ref 3.0–15.0)
Neutrophils %: 75.8 % (ref 42.0–78.0)
Neutrophils Absolute: 8.1 10*3/uL (ref 1.7–8.6)
PLT CT: 306 10*3/uL (ref 130–440)
RBC: 4.31 10*6/uL (ref 4.00–5.70)
RDW: 13.9 % (ref 11.0–14.0)
WBC: 10.7 10*3/uL (ref 4.0–11.0)

## 2020-09-16 LAB — SALICYLATE LEVEL: Salicylate Level: <5 mg/dL (ref 0.0–30.0)

## 2020-09-16 LAB — VH DEXTROSE STICK GLUCOSE
Glucose POCT: 346 mg/dL — ABNORMAL HIGH (ref 71–99)
Glucose POCT: 276 mg/dL — ABNORMAL HIGH (ref 71–99)
Glucose POCT: 249 mg/dL — ABNORMAL HIGH (ref 71–99)

## 2020-09-16 MED ORDER — VH INSULIN REGULAR HUMAN 100 UNIT/ML IJ SOLN
6.0000 [IU] | Freq: Once | INTRAMUSCULAR | Status: AC
Start: 2020-09-16 — End: 2020-09-16
  Administered 2020-09-16: 6 [IU] via SUBCUTANEOUS

## 2020-09-16 MED ORDER — VH INSULIN REGULAR HUMAN 100 UNIT/ML IJ SOLN
INTRAMUSCULAR | Status: AC
Start: 2020-09-16 — End: ?
  Filled 2020-09-16: qty 3

## 2020-09-16 MED ORDER — SODIUM CHLORIDE 0.9 % IV BOLUS
1000.0000 mL | Freq: Once | INTRAVENOUS | Status: AC
Start: 2020-09-16 — End: 2020-09-16
  Administered 2020-09-16: 1000 mL via INTRAVENOUS

## 2020-09-16 NOTE — ED Notes (Signed)
Unable to assess meds, pt unsure of them/doesnt have a list with him/ no one her with him.

## 2020-09-16 NOTE — ED Provider Notes (Signed)
History     Chief Complaint   Patient presents with   . Altered Mental Status     He presents via EMS for evaluation of his altered mental status. Nurses received report from EMS that the patient was yelling and acting inappropriately outside of his home claiming that he was chasing after kids that were in his home. His neighbors reportedly called 911. The patient states that he chased a kid out of his house today and called the police himself. He was reportedly combative with responding police and EMS. He was given haldol and ativan prior to his arrival. The patient initially states that he was feeling fine today other than feeling that his blood sugar was high. He states that he is on pills and insulin for diabetes.  He later states that he has experienced chills. He reports feeling cold since he started taking a blood thinner. He also reports a headache today. He reports feeling numb today which he attributes to chasing the kid today. He denies biting his tongue. He denies seizures. He knows that he is in the hospital, but is unsure which hospital he is in, what day it is, or what year it is. He does know his birthday. The patient was admitted to this facility for 12/19 through 05/04/20 with right sided weakness and trouble speaking and was diagnosed with a left temporal occipital subacute infarct. The patient left AMA following this hospitalization. The patient states that his right sided weakness and trouble speaking has improved since his stroke. He denies smoking or drinking. He denies drug use. He reports living in his home with his girlfriend.           Past Medical History:   Diagnosis Date   . Cerebrovascular accident    . Cocaine abuse    . Diabetes mellitus    . Hypertension    . Medical non-compliance    . Methamphetamine abuse    . Polysubstance (including opioids) dependence, binge pattern        Past Surgical History:   Procedure Laterality Date   . APPENDECTOMY (OPEN)  2011   . HERNIA REPAIR          History reviewed. No pertinent family history.    Social  Social History     Tobacco Use   . Smoking status: Never Smoker   . Smokeless tobacco: Never Used   Vaping Use   . Vaping Use: Never used   Substance Use Topics   . Alcohol use: Not Currently   . Drug use: Not Currently       .     No Known Allergies    Home Medications     Med List Status: Complete Set By: [place], Courtney, RN at 09/17/2020  4:14 AM                amLODIPine (NORVASC) 5 MG tablet     Take 1 tablet (5 mg total) by mouth daily     aspirin 81 MG chewable tablet     Chew 1 tablet (81 mg total) by mouth daily     atenolol (TENORMIN) 25 MG tablet     Take 0.5 tablets (12.5 mg total) by mouth daily     atorvastatin (LIPITOR) 80 MG tablet     Take 1 tablet (80 mg total) by mouth nightly     glipiZIDE (GLUCOTROL) 10 MG tablet     Take 1 tablet (10 mg total) by mouth every morning before  breakfast     metFORMIN (GLUCOPHAGE) 500 MG tablet     Take 1 tablet (500 mg total) by mouth 2 (two) times daily with meals Start tomorrow dinner time           Review of Systems   Unable to perform ROS: Mental status change   Constitutional: Positive for chills.   HENT:        Denies biting tongue   Neurological: Positive for numbness and headaches. Negative for seizures.        Patient reportedly acting inappropriately to neighbors who summoned EMS. Patient was reportedly yelling and claimed to be chasing people from his house. H/o stroke. States that deficits from stroke have resolved.       Physical Exam    BP: 91/60, Heart Rate: (!) 119, Temp: 97.6 F (36.4 C), Resp Rate: 20, SpO2: 99 %, Weight: 64 kg    Physical Exam  Vitals and nursing note reviewed.   Constitutional:       General: He is not in acute distress.     Appearance: He is well-developed. He is not diaphoretic.   HENT:      Head: Normocephalic and atraumatic.      Right Ear: External ear normal.      Left Ear: External ear normal.   Eyes:      General: No scleral icterus.        Right eye: No  discharge.         Left eye: No discharge.      Conjunctiva/sclera: Conjunctivae normal.   Cardiovascular:      Rate and Rhythm: Regular rhythm. Tachycardia present.      Heart sounds: No murmur heard.    No friction rub. No gallop.   Pulmonary:      Effort: Pulmonary effort is normal. No respiratory distress.      Breath sounds: Normal breath sounds. No stridor. No wheezing or rales.   Chest:      Chest wall: No tenderness.   Abdominal:      General: Bowel sounds are normal. There is no distension.      Palpations: There is no mass.      Tenderness: There is no abdominal tenderness. There is no guarding or rebound.   Musculoskeletal:         General: No tenderness.      Cervical back: Neck supple.   Skin:     Coloration: Skin is not pale.      Findings: No erythema or rash.   Neurological:      Mental Status: He is alert.      Motor: No abnormal muscle tone.      Comments: Able to lift and hold the bilateral arms and legs off of the stretcher. Equal grip strength bilaterally. Speech is not dysarthric or aphasic.           MDM and ED Course     ED Medication Orders (From admission, onward)    Start Ordered     Status Ordering Provider    09/16/20 2007 09/16/20 2006  sodium chloride 0.9 % bolus 1,000 mL  Once in ED        Route: Intravenous  Ordered Dose: 1,000 mL     Last MAR action: Stopped Chesney Klimaszewski, Kristine Garbe    09/16/20 1943 09/16/20 1942  sodium chloride 0.9 % bolus 1,000 mL  Once in ED        Route: Intravenous  Ordered Dose: 1,000 mL  Last MAR action: Stopped Myna Hidalgo    09/16/20 1822 09/16/20 1821  insulin regular (HumuLIN R) injection 6 Units  Once in ED        Route: Subcutaneous  Ordered Dose: 6 Units     Last MAR action: Given Lileigh Fahringer C    09/16/20 1707 09/16/20 1706  sodium chloride 0.9 % bolus 1,000 mL  Once in ED        Route: Intravenous  Ordered Dose: 1,000 mL     Last MAR action: Stopped Delvis Kau C         XR Chest AP Portable   Final Result   1. No focal infiltrate,  pleural effusion, or pneumothorax. Normal heart size.      ReadingStation:[email or web address]      CT Head WO- (Rad read)   Final Result   1. No acute bleed or edema identified. 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   2. Maxillary and ethmoid paranasal sinus disease.      ReadingStation:[email or web address]      CT Angiogram Chest    (Results Pending)     Labs Reviewed   CBC AND DIFFERENTIAL - Abnormal; Notable for the following components:       Result Value    Hemoglobin 12.7 (*)     Hematocrit 36.9 (*)     Lymphocytes 14.7 (*)     All other components within normal limits   COMPREHENSIVE METABOLIC PANEL - Abnormal; Notable for the following components:    Glucose 340 (*)     Creatinine 1.54 (*)     BUN 30 (*)     Albumin 3.4 (*)     EGFR 52 (*)     All other components within normal limits   VH URINALYSIS WITH MICROSCOPIC - Abnormal; Notable for the following components:    Clarity, UA Slightly Cloudy (*)     Protein, UR 30 (*)     Glucose, UA >=500 (*)     Blood, UA Small (*)     Leukocyte Esterase, UA 250 (*)     WBC, UA 28 (*)     RBC, UA 47 (*)     Budding Yeast, UA Many (*)     Hyphal Yeast, UA Occasional (*)     Sperm, UA Occasional (*)     Hyaline Casts, UA >20 (*)     All other components within normal limits   VH URINE DRUG SCREEN - Abnormal; Notable for the following components:    Urine Specific Gravity >1.050 (*)     Urine Amphetamine Presumptive Positive (*)     Urine Benzodiazepines Presumptive Positive (*)     All other components within normal limits   VH DEXTROSE STICK GLUCOSE - Abnormal; Notable for the following components:    Glucose POCT 346 (*)     All other components within normal limits   VH DEXTROSE STICK GLUCOSE - Abnormal; Notable for the following components:    Glucose POCT 276 (*)     All other components within normal limits   VH DEXTROSE STICK GLUCOSE - Abnormal; Notable for the following components:    Glucose POCT 249 (*)     All  other components within normal limits   CBC AND DIFFERENTIAL - Abnormal; Notable for the following components:    RBC 3.73 (*)     Hemoglobin 11.4 (*)     Hematocrit 32.3 (*)  All other components within normal limits   BASIC METABOLIC PANEL - Abnormal; Notable for the following components:    Calcium 8.1 (*)     Glucose 167 (*)     Creatinine 0.77 (*)     All other components within normal limits   VH DEXTROSE STICK GLUCOSE - Abnormal; Notable for the following components:    Glucose POCT 166 (*)     All other components within normal limits   VH DEXTROSE STICK GLUCOSE - Abnormal; Notable for the following components:    Glucose POCT 141 (*)     All other components within normal limits   VH DEXTROSE STICK GLUCOSE - Abnormal; Notable for the following components:    Glucose POCT 203 (*)     All other components within normal limits   VH DEXTROSE STICK GLUCOSE - Abnormal; Notable for the following components:    Glucose POCT 367 (*)     All other components within normal limits   VH DEXTROSE STICK GLUCOSE - Abnormal; Notable for the following components:    Glucose POCT 168 (*)     All other components within normal limits   ACETAMINOPHEN LEVEL   ETHANOL   PT AND APTT   SALICYLATE LEVEL   HEMOGLOBIN A1C   ETHANOL   TROPONIN I   TROPONIN I   TROPONIN I         MDM  Number of Diagnoses or Management Options  Altered mental status  Diagnosis management comments: Differential includes, alcohol, acidosis, alkalosis, endocrine, electrolytes, encephalopathy, insulin surprise deficit, opiates, uremia, trauma, head injury, hemorrhagic shock, intracranial pressure, infection, poisoning, psychiatric, seizure, syncope.                   Procedures    Clinical Impression & Disposition     Clinical Impression  Final diagnoses:   Altered mental status        ED Disposition     ED Disposition   AMA    Condition   --    Date/Time   Thu Sep 16, 2020 10:00 PM    Comment   Service: Medicine [106]  Patient awake and alert, declines  admission.  Nurse will ambulate patient be sure he can ambulate normal.              Current Discharge Medication List           Treatment Team: Scribe: Jacklynn Bue, MD  09/17/20 2248

## 2020-09-16 NOTE — ED Notes (Signed)
Hung second bag of fluids   Reminded pt that we need a urine sample   He stated "I know I know I can hold my water"

## 2020-09-16 NOTE — ED Notes (Signed)
Pt ambulatory to and from the bathroom with an escort. Pt has soft stool noted. Not able to provide urine. Pt walked back to bed. Pt c/o feeling lightheaded and dizzy after getting up off toilet but able to ambulate back to bed with 1 assist.

## 2020-09-16 NOTE — ED Triage Notes (Addendum)
See quick triage.  Per chart pt has hx of non compliance with meds/ medical interventions and also has hx of polysubstance abuse in the past. Pt denies taking any substances today.

## 2020-09-16 NOTE — ED Notes (Signed)
Dr. Leskovec at bedside at this time.

## 2020-09-16 NOTE — ED Notes (Signed)
Pt refused EKG until he goes to the bathroom. Reports " I have to shit or I am going to shit the bed."

## 2020-09-17 ENCOUNTER — Encounter: Payer: Self-pay | Admitting: Family Medicine

## 2020-09-17 ENCOUNTER — Observation Stay: Payer: Commercial Managed Care - POS

## 2020-09-17 DIAGNOSIS — F1910 Other psychoactive substance abuse, uncomplicated: Secondary | ICD-10-CM | POA: Diagnosis present

## 2020-09-17 DIAGNOSIS — R4182 Altered mental status, unspecified: Secondary | ICD-10-CM

## 2020-09-17 LAB — VH URINALYSIS WITH MICROSCOPIC
Bilirubin, UA: NEGATIVE
Glucose, UA: >=500 mg/dL — AB
Hyaline Casts, UA: >20 /lpf — AB
Ketones UA: 5 mg/dL
Leukocyte Esterase, UA: 250 Leu/uL — AB
Nitrite, UA: NEGATIVE
Protein, UR: 30 mg/dL — AB
RBC, UA: 47 /hpf — ABNORMAL HIGH (ref 0–4)
Urine Specific Gravity: 1.018 (ref 1.001–1.040)
Urobilinogen, UA: NORMAL mg/dL
WBC, UA: 28 /hpf — ABNORMAL HIGH (ref 0–4)
pH, Urine: 5 pH (ref 5.0–8.0)

## 2020-09-17 LAB — VH URINE DRUG SCREEN
Propoxyphene: NEGATIVE
Urine Amphetamine: POSITIVE — AB
Urine Barbiturates: NEGATIVE
Urine Benzodiazepines: POSITIVE — AB
Urine Buprenorphine: NEGATIVE
Urine Cannabinoids: NEGATIVE
Urine Cocaine: NEGATIVE
Urine Creatinine Random: 169 mg/dL
Urine Ecstasy Screen: NEGATIVE
Urine Fentanyl Screen: NEGATIVE
Urine Methadone Screen: NEGATIVE
Urine Opiates: NEGATIVE
Urine Oxycodone: NEGATIVE
Urine Phencyclidine: NEGATIVE
Urine Specific Gravity: >1.05 — ABNORMAL HIGH (ref 1.001–1.040)
pH, Urine: 5 pH (ref 5.0–8.0)

## 2020-09-17 LAB — BASIC METABOLIC PANEL
Anion Gap: 12.6 mMol/L (ref 7.0–18.0)
BUN / Creatinine Ratio: 22.1 Ratio (ref 10.0–30.0)
BUN: 17 mg/dL (ref 7–22)
CO2: 21 mMol/L (ref 20–30)
Calcium: 8.1 mg/dL — ABNORMAL LOW (ref 8.5–10.5)
Chloride: 109 mMol/L (ref 98–110)
Creatinine: 0.77 mg/dL — ABNORMAL LOW (ref 0.80–1.30)
EGFR: 104 mL/min/{1.73_m2} (ref 60–150)
Glucose: 167 mg/dL — ABNORMAL HIGH (ref 71–99)
Osmolality Calculated: 283 mOsm/kg (ref 275–300)
Potassium: 3.6 mMol/L (ref 3.5–5.3)
Sodium: 139 mMol/L (ref 136–147)

## 2020-09-17 LAB — ECG 12-LEAD
P-R Interval: 124 ms
QRS Axis: 72 deg

## 2020-09-17 LAB — CBC AND DIFFERENTIAL
Basophils %: 0.8 % (ref 0.0–3.0)
Basophils Absolute: 0.1 10*3/uL (ref 0.0–0.3)
Eosinophils %: 3.3 % (ref 0.0–7.0)
Eosinophils Absolute: 0.2 10*3/uL (ref 0.0–0.8)
Hematocrit: 32.3 % — ABNORMAL LOW (ref 39.0–52.5)
Hemoglobin: 11.4 gm/dL — ABNORMAL LOW (ref 13.0–17.5)
Lymphocytes Absolute: 1.6 10*3/uL (ref 0.6–5.1)
Lymphocytes: 23.4 % (ref 15.0–46.0)
MCH: 31 pg (ref 28–35)
MCHC: 35 gm/dL (ref 32–36)
MCV: 87 fL (ref 80–100)
MPV: 6.7 fL (ref 6.0–10.0)
Monocytes Absolute: 0.5 10*3/uL (ref 0.1–1.7)
Monocytes: 6.7 % (ref 3.0–15.0)
Neutrophils %: 65.8 % (ref 42.0–78.0)
Neutrophils Absolute: 4.6 10*3/uL (ref 1.7–8.6)
PLT CT: 256 10*3/uL (ref 130–440)
RBC: 3.73 10*6/uL — ABNORMAL LOW (ref 4.00–5.70)
RDW: 14 % (ref 11.0–14.0)
WBC: 7 10*3/uL (ref 4.0–11.0)

## 2020-09-17 LAB — ETHANOL: Alcohol: <10 mg/dL (ref 0–9)

## 2020-09-17 LAB — TROPONIN I
Troponin I: <0.01 ng/mL (ref 0.00–0.02)
Troponin I: 0.01 ng/mL (ref 0.00–0.02)
Troponin I: 0.01 ng/mL (ref 0.00–0.02)

## 2020-09-17 LAB — HEMOGLOBIN A1C: Hgb A1C, %: 8.3 %

## 2020-09-17 LAB — VH DEXTROSE STICK GLUCOSE
Glucose POCT: 166 mg/dL — ABNORMAL HIGH (ref 71–99)
Glucose POCT: 141 mg/dL — ABNORMAL HIGH (ref 71–99)
Glucose POCT: 203 mg/dL — ABNORMAL HIGH (ref 71–99)
Glucose POCT: 367 mg/dL — ABNORMAL HIGH (ref 71–99)
Glucose POCT: 168 mg/dL — ABNORMAL HIGH (ref 71–99)

## 2020-09-17 MED ORDER — ASPIRIN 81 MG PO CHEW
81.0000 mg | CHEWABLE_TABLET | Freq: Every day | ORAL | Status: DC
Start: 2020-09-17 — End: 2020-09-18
  Administered 2020-09-17 – 2020-09-18 (×2): 81 mg via ORAL
  Filled 2020-09-17 (×2): qty 1

## 2020-09-17 MED ORDER — AMLODIPINE BESYLATE 5 MG PO TABS
5.0000 mg | ORAL_TABLET | Freq: Every day | ORAL | Status: DC
Start: 2020-09-17 — End: 2020-09-18
  Administered 2020-09-17 – 2020-09-18 (×2): 5 mg via ORAL
  Filled 2020-09-17 (×2): qty 1

## 2020-09-17 MED ORDER — INSULIN LISPRO (1 UNIT DIAL) 100 UNIT/ML SC SOPN
1.0000 [IU] | PEN_INJECTOR | Freq: Every day | SUBCUTANEOUS | Status: DC | PRN
Start: 2020-09-17 — End: 2020-09-18
  Administered 2020-09-17 – 2020-09-18 (×2): 1 [IU] via SUBCUTANEOUS
  Filled 2020-09-17: qty 3

## 2020-09-17 MED ORDER — GLUCAGON 1 MG IJ SOLR (WRAP)
1.0000 mg | INTRAMUSCULAR | Status: DC | PRN
Start: 2020-09-17 — End: 2020-09-18

## 2020-09-17 MED ORDER — VH HEPARIN SODIUM (PORCINE) 5000 UNIT/ML IJ SOLN
5000.0000 [IU] | Freq: Three times a day (TID) | INTRAMUSCULAR | Status: DC
Start: 2020-09-17 — End: 2020-09-18
  Administered 2020-09-17 – 2020-09-18 (×4): 5000 [IU] via SUBCUTANEOUS
  Filled 2020-09-17 (×4): qty 1

## 2020-09-17 MED ORDER — INSULIN LISPRO (1 UNIT DIAL) 100 UNIT/ML SC SOPN
1.0000 [IU] | PEN_INJECTOR | Freq: Every evening | SUBCUTANEOUS | Status: DC
Start: 2020-09-17 — End: 2020-09-18
  Administered 2020-09-17: 1 [IU] via SUBCUTANEOUS

## 2020-09-17 MED ORDER — FLUCONAZOLE 100 MG PO TABS
150.0000 mg | ORAL_TABLET | Freq: Once | ORAL | Status: AC
Start: 2020-09-17 — End: 2020-09-17
  Administered 2020-09-17: 150 mg via ORAL
  Filled 2020-09-17: qty 2

## 2020-09-17 MED ORDER — LANTUS SOLOSTAR 100 UNIT/ML SC SOPN
5.0000 [IU] | PEN_INJECTOR | Freq: Every evening | SUBCUTANEOUS | Status: DC
Start: 2020-09-17 — End: 2020-09-18
  Administered 2020-09-17: 5 [IU] via SUBCUTANEOUS
  Filled 2020-09-17: qty 3

## 2020-09-17 MED ORDER — ACETAMINOPHEN 325 MG PO TABS
650.0000 mg | ORAL_TABLET | ORAL | Status: DC | PRN
Start: 2020-09-17 — End: 2020-09-18
  Administered 2020-09-18: 650 mg via ORAL
  Filled 2020-09-17: qty 2

## 2020-09-17 MED ORDER — ATORVASTATIN CALCIUM 40 MG PO TABS
80.0000 mg | ORAL_TABLET | Freq: Every evening | ORAL | Status: DC
Start: 2020-09-17 — End: 2020-09-18
  Administered 2020-09-17: 80 mg via ORAL
  Filled 2020-09-17: qty 2

## 2020-09-17 MED ORDER — VH VITAMIN B-1 100 MG PO TABS (WRAP)
100.0000 mg | ORAL_TABLET | Freq: Every day | ORAL | Status: DC
Start: 2020-09-17 — End: 2020-09-18
  Administered 2020-09-17 – 2020-09-18 (×2): 100 mg via ORAL
  Filled 2020-09-17 (×2): qty 1

## 2020-09-17 MED ORDER — CEROVITE ADVANCED FORMULA PO TABS
1.0000 | ORAL_TABLET | Freq: Every day | ORAL | Status: DC
Start: 2020-09-17 — End: 2020-09-18
  Administered 2020-09-17 – 2020-09-18 (×2): 1 via ORAL
  Filled 2020-09-17 (×2): qty 1

## 2020-09-17 MED ORDER — VH DEXTROSE 10 % IV BOLUS (ADULT)
125.0000 mL | INTRAVENOUS | Status: DC | PRN
Start: 2020-09-17 — End: 2020-09-18

## 2020-09-17 MED ORDER — SODIUM CHLORIDE (PF) 0.9 % IJ SOLN
3.0000 mL | Freq: Three times a day (TID) | INTRAMUSCULAR | Status: DC
Start: 2020-09-17 — End: 2020-09-18
  Administered 2020-09-17 – 2020-09-18 (×4): 3 mL via INTRAVENOUS

## 2020-09-17 MED ORDER — ACETAMINOPHEN 160 MG/5ML PO SOLN
650.0000 mg | ORAL | Status: DC | PRN
Start: 2020-09-17 — End: 2020-09-18

## 2020-09-17 MED ORDER — FOLIC ACID 1 MG PO TABS
1.0000 mg | ORAL_TABLET | Freq: Every day | ORAL | Status: DC
Start: 2020-09-17 — End: 2020-09-18
  Administered 2020-09-17 – 2020-09-18 (×2): 1 mg via ORAL
  Filled 2020-09-17 (×2): qty 1

## 2020-09-17 MED ORDER — VH ATENOLOL 25 MG PO (ALLOW QUARTERING)
12.5000 mg | ORAL_TABLET | Freq: Every day | ORAL | Status: DC
Start: 2020-09-17 — End: 2020-09-17
  Administered 2020-09-17: 12.5 mg via ORAL
  Filled 2020-09-17: qty 1

## 2020-09-17 MED ORDER — IOHEXOL 350 MG/ML IV SOLN
54.0000 mL | Freq: Once | INTRAVENOUS | Status: AC | PRN
Start: 2020-09-17 — End: 2020-09-17
  Administered 2020-09-17: 54 mL via INTRAVENOUS

## 2020-09-17 MED ORDER — ACETAMINOPHEN 650 MG RE SUPP
650.0000 mg | RECTAL | Status: DC | PRN
Start: 2020-09-17 — End: 2020-09-18

## 2020-09-17 MED ORDER — SODIUM CHLORIDE (PF) 0.9 % IJ SOLN
0.4000 mg | INTRAMUSCULAR | Status: DC | PRN
Start: 2020-09-17 — End: 2020-09-18

## 2020-09-17 MED ORDER — VH ATENOLOL 25 MG PO (ALLOW QUARTERING)
25.0000 mg | ORAL_TABLET | Freq: Every day | ORAL | Status: DC
Start: 2020-09-18 — End: 2020-09-18
  Administered 2020-09-18: 25 mg via ORAL
  Filled 2020-09-17: qty 1

## 2020-09-17 MED ORDER — INSULIN LISPRO (1 UNIT DIAL) 100 UNIT/ML SC SOPN
1.0000 [IU] | PEN_INJECTOR | Freq: Three times a day (TID) | SUBCUTANEOUS | Status: DC
Start: 2020-09-17 — End: 2020-09-18
  Administered 2020-09-17: 1 [IU] via SUBCUTANEOUS
  Administered 2020-09-17: 5 [IU] via SUBCUTANEOUS
  Administered 2020-09-18: 1 [IU] via SUBCUTANEOUS
  Administered 2020-09-18: 3 [IU] via SUBCUTANEOUS

## 2020-09-17 NOTE — Plan of Care (Addendum)
NURSE NOTE SUMMARY  [facility] - GI/ENDO/GEN MED   Patient Name: Walter West   Attending Physician: Jonn Shingles, MD   Today's date:   09/17/2020 LOS: 0 days   Shift Summary:                                                              0430:  Assumed care of pt got admitted from ED with AMS ,   Pt resting in bed at this time with no needs or issues stated.  Call bell in reach. Pt noted alert and oriented, vitals stable , iv taken out as noted swollen when flushed , pt noted steady on feet when getting up ,blood sugar monitering , meds given per [facility] ,  Will continue to monitor.      Provider Notifications:      Rapid Response Notifications:  Mobility:      PMP Activity: Step 6 - Walks in Room (09/17/2020  4:29 AM)     Weight tracking:  Family Dynamic:   Last 3 Weights for the past 72 hrs (Last 3 readings):   Weight   09/17/20 0358 65.4 kg (144 lb 2.9 oz)   09/16/20 1626 64 kg (141 lb)             Recent Vitals Last Bowel Movement   BP: 124/83 (09/17/2020  3:46 AM)  Heart Rate: (!) 109 (09/17/2020  3:46 AM)  Temp: 98.9 F (37.2 C) (09/17/2020  3:46 AM)  Resp Rate: 18 (09/17/2020  3:46 AM)  Height: 1.626 m (5\' 4" ) (09/17/2020  3:58 AM)  Weight: 65.4 kg (144 lb 2.9 oz) (09/17/2020  3:58 AM)  SpO2: 100 % (09/17/2020  3:46 AM)   No data recorded       Problem: Moderate/High Fall Risk Score >5  Goal: Patient will remain free of falls  09/17/2020 0438 by Synthia Innocent, RN  Outcome: Progressing  Flowsheets (Taken 09/17/2020 0429)  VH Moderate Risk (6-13): ALL REQUIRED LOW INTERVENTIONS  VH High Risk (Greater than 13): ALL REQUIRED LOW INTERVENTIONS  09/17/2020 0436 by Synthia Innocent, RN  Outcome: Progressing  Flowsheets (Taken 09/17/2020 0429)  VH Moderate Risk (6-13): ALL REQUIRED LOW INTERVENTIONS  VH High Risk (Greater than 13): ALL REQUIRED LOW INTERVENTIONS

## 2020-09-17 NOTE — Significant Event (Signed)
Nonbillable rounding  Patient seen and examined at bedside.  Complains of right forearm and left foot below ankle numbness and tingling.   denies any motor weakness.  Also complains of substernal and right-sided pressure-like chest pain.  Patient continues to be tachycardic although he says that his heart rate is usually elevated.Marland Kitchen  #History of alcohol use and states his last alcoholic drink was 3 weeks ago.  On physical exam motor function 5/5 in all extremities.  No  focal neurological weakness noted.  We will order alcohol level, stat CT chest as patient tachycardic with chest pain, EKG, troponin.  Start patient on CIWA protocol, multivitamin, thiamine, folic acid.      Please review the H&P from earlier today for complete assessment plan.  I agree with assessment plan noted in H&P except above.

## 2020-09-17 NOTE — ED Notes (Signed)
Completed in and out catheter to obtain urine. 600 mL noted. Sent specimen to lab. Pt tolerated well

## 2020-09-17 NOTE — ED Provider Notes (Incomplete)
History     Chief Complaint   Patient presents with   . Altered Mental Status     He presents via EMS for evaluation of his altered mental status. Nurses received report from EMS that the patient was yelling and acting inappropriately outside of his home claiming that he was chasing after kids that were in his home. His neighbors reportedly called 911. The patient states that he chased a kid out of his house today and called the police himself. He was reportedly combative with responding police and EMS. He was given haldol and ativan prior to his arrival. The patient initially states that he was feeling fine today other than feeling that his blood sugar was high. He states that he is on pills and insulin for diabetes.  He later states that he has experienced chills. He reports feeling cold since he started taking a blood thinner. He also reports a headache today. He reports feeling numb today which he attributes to chasing the kid today. He denies biting his tongue. He denies seizures. He knows that he is in the hospital, but is unsure which hospital he is in, what day it is, or what year it is. He does know his birthday. The patient was admitted to this facility for 12/19 through 05/04/20 with right sided weakness and trouble speaking and was diagnosed with a left temporal occipital subacute infarct. The patient left AMA following this hospitalization. The patient states that his right sided weakness and trouble speaking has improved since his stroke. He denies smoking or drinking. He denies drug use. He reports living in his home with his girlfriend.           Past Medical History:   Diagnosis Date   . Cerebrovascular accident    . Cocaine abuse    . Diabetes mellitus    . Hypertension    . Medical non-compliance    . Methamphetamine abuse    . Polysubstance (including opioids) dependence, binge pattern        Past Surgical History:   Procedure Laterality Date   . APPENDECTOMY (OPEN)  2011   . HERNIA REPAIR          History reviewed. No pertinent family history.    Social  Social History     Tobacco Use   . Smoking status: Never Smoker   . Smokeless tobacco: Never Used   Vaping Use   . Vaping Use: Never used   Substance Use Topics   . Alcohol use: Not Currently   . Drug use: Not Currently       .     No Known Allergies    Home Medications     Med List Status: Unable to Assess Set By: Stacey Drain, RN at 09/16/2020  4:41 PM                amLODIPine (NORVASC) 5 MG tablet     Take 1 tablet (5 mg total) by mouth daily     aspirin 81 MG chewable tablet     Chew 1 tablet (81 mg total) by mouth daily     atenolol (TENORMIN) 25 MG tablet     Take 0.5 tablets (12.5 mg total) by mouth daily     atorvastatin (LIPITOR) 80 MG tablet     Take 1 tablet (80 mg total) by mouth nightly     glipiZIDE (GLUCOTROL) 10 MG tablet     Take 1 tablet (10 mg total) by mouth every  morning before breakfast     metFORMIN (GLUCOPHAGE) 500 MG tablet     Take 1 tablet (500 mg total) by mouth 2 (two) times daily with meals Start tomorrow dinner time           Review of Systems   Unable to perform ROS: Mental status change   Constitutional: Positive for chills.   HENT:        Denies biting tongue   Neurological: Positive for numbness and headaches. Negative for seizures.        Patient reportedly acting inappropriately to neighbors who summoned EMS. Patient was reportedly yelling and claimed to be chasing people from his house. H/o stroke. States that deficits from stroke have resolved.       Physical Exam    BP: 91/60, Heart Rate: (!) 119, Temp: 97.6 F (36.4 C), Resp Rate: 20, SpO2: 99 %, Weight: 64 kg    Physical Exam  Vitals and nursing note reviewed.   Constitutional:       General: He is not in acute distress.     Appearance: He is well-developed. He is not diaphoretic.   HENT:      Head: Normocephalic and atraumatic.      Right Ear: External ear normal.      Left Ear: External ear normal.   Eyes:      General: No scleral icterus.        Right eye:  No discharge.         Left eye: No discharge.      Conjunctiva/sclera: Conjunctivae normal.   Cardiovascular:      Rate and Rhythm: Regular rhythm. Tachycardia present.      Heart sounds: No murmur heard.    No friction rub. No gallop.   Pulmonary:      Effort: Pulmonary effort is normal. No respiratory distress.      Breath sounds: Normal breath sounds. No stridor. No wheezing or rales.   Chest:      Chest wall: No tenderness.   Abdominal:      General: Bowel sounds are normal. There is no distension.      Palpations: There is no mass.      Tenderness: There is no abdominal tenderness. There is no guarding or rebound.   Musculoskeletal:         General: No tenderness.      Cervical back: Neck supple.   Skin:     Coloration: Skin is not pale.      Findings: No erythema or rash.   Neurological:      Mental Status: He is alert.      Motor: No abnormal muscle tone.      Comments: Able to lift and hold the bilateral arms and legs off of the stretcher. Equal grip strength bilaterally. Speech is not dysarthric or aphasic.           MDM and ED Course     ED Medication Orders (From admission, onward)    Start Ordered     Status Ordering Provider    09/16/20 2007 09/16/20 2006  sodium chloride 0.9 % bolus 1,000 mL  Once in ED        Route: Intravenous  Ordered Dose: 1,000 mL     Last [facility] action: New Bag Myna Hidalgo    09/16/20 1943 09/16/20 1942  sodium chloride 0.9 % bolus 1,000 mL  Once in ED        Route: Intravenous  Ordered  Dose: 1,000 mL     Last MAR action: Stopped LESKOVECKristine Garbe    09/16/20 1822 09/16/20 1821  insulin regular (HumuLIN R) injection 6 Units  Once in ED        Route: Subcutaneous  Ordered Dose: 6 Units     Last MAR action: Given LESKOVEC, WILLIAM C    09/16/20 1707 09/16/20 1706  sodium chloride 0.9 % bolus 1,000 mL  Once in ED        Route: Intravenous  Ordered Dose: 1,000 mL     Last MAR action: Stopped LESKOVEC, WILLIAM C         XR Chest AP Portable   Final Result   1. No focal  infiltrate, pleural effusion, or pneumothorax. Normal heart size.      ReadingStation:[email or web address]      CT Head WO- (Rad read)   Final Result   1. No acute bleed or edema identified. 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   2. Maxillary and ethmoid paranasal sinus disease.      ReadingStation:[email or web address]        Labs Reviewed   CBC AND DIFFERENTIAL - Abnormal; Notable for the following components:       Result Value    Hemoglobin 12.7 (*)     Hematocrit 36.9 (*)     Lymphocytes 14.7 (*)     All other components within normal limits   COMPREHENSIVE METABOLIC PANEL - Abnormal; Notable for the following components:    Glucose 340 (*)     Creatinine 1.54 (*)     BUN 30 (*)     Albumin 3.4 (*)     EGFR 52 (*)     All other components within normal limits   VH DEXTROSE STICK GLUCOSE - Abnormal; Notable for the following components:    Glucose POCT 346 (*)     All other components within normal limits   VH DEXTROSE STICK GLUCOSE - Abnormal; Notable for the following components:    Glucose POCT 276 (*)     All other components within normal limits   VH DEXTROSE STICK GLUCOSE - Abnormal; Notable for the following components:    Glucose POCT 249 (*)     All other components within normal limits   ACETAMINOPHEN LEVEL   ETHANOL   PT AND APTT   SALICYLATE LEVEL   VH URINALYSIS WITH MICROSCOPIC   VH URINE DRUG SCREEN         MDM  Number of Diagnoses or Management Options  Altered mental status  Diagnosis management comments: Differential includes, alcohol, acidosis, alkalosis, endocrine, electrolytes, encephalopathy, insulin surprise deficit, opiates, uremia, trauma, head injury, hemorrhagic shock, intracranial pressure, infection, poisoning, psychiatric, seizure, syncope.                   Procedures    Clinical Impression & Disposition     Clinical Impression  Final diagnoses:   Altered mental status        ED Disposition     ED Disposition   AMA    Condition   --     Date/Time   Thu Sep 16, 2020 10:00 PM    Comment   Service: Medicine [[id number]]  Patient awake and alert, declines admission.  Nurse will ambulate patient be sure he can ambulate normal.              New Prescriptions  No medications on file        Treatment Team: Scribe: Denton Brick

## 2020-09-17 NOTE — Consults (Signed)
The Vascular Access Team (VAT) was consulted for assistance with IV placement by the patients care team.     MRN: [id number]  NAME: Walter West, Walter West  DOB: [birth year]    Ultrasound was used to confirm patency of the Left forearm cephalic vein prior to obtaining venous access. The area to be punctured was cleansed with chlorhexidine 2% for more than 30 seconds maintaining sterile precautions.    A sterile cover was sheathed to the Ultrasound probe. The vessel was then revisualized prior to puncture of the vessel with the selected needle under direct sonographic guidance and inserted per manufacturer guidelines. The catheter was flushed with normal saline to confirm brisk blood return via capped extension tubing. The catheter was stabilized on the skin using a sterile transparent occlusive dressing applied using sterile technique.      Insertion Site (vein): Left forearm  cephalic  Catheter Gauge: IV: 84Z  Total Length: 1.75"    Peripheral IV access placed by: Aneta Mins, RN      FINDINGS/CONCLUSIONS:   No signs or symptoms of related complications. Patient did tolerate procedure well.    Thank you for consulting the Vascular Access Team. Please notify us for further questions or concerns during hours of operation at Ext. [phone] or re-consulting our team when needed.

## 2020-09-17 NOTE — Plan of Care (Signed)
Problem: Moderate/High Fall Risk Score >5  Goal: Patient will remain free of falls  Outcome: Progressing  Flowsheets (Taken 09/17/2020 1938)  VH High Risk (Greater than 13):   Use of "STOP ask for help" sign   ALL REQUIRED MODERATE INTERVENTIONS   RED "HIGH FALL RISK" SIGNAGE   BED ALARM WILL BE ACTIVATED WHEN THE PATEINT IS IN BED WITH SIGNAGE "RESET BED ALARM"   Keep door open for better visibility   Include family/significant other in multidisciplinary discussion regarding plan of care as appropriate   Request PT/OT therapy consult order from physician for patients with gait/mobility impairment   Use assistive devices     Problem: Diabetes: Glucose Imbalance  Goal: Blood glucose stable at established goal  Outcome: Progressing  Flowsheets (Taken 09/17/2020 1943)  Blood glucose stable at established goal:   Monitor lab values   Assess for hypoglycemia /hyperglycemia   Monitor/assess vital signs   Coordinate medication administration with meals, as indicated   Ensure appropriate diet and assess tolerance   Ensure adequate hydration   Ensure patient/family has adequate teaching materials

## 2020-09-17 NOTE — Plan of Care (Signed)
Problem: Moderate/High Fall Risk Score >5  Goal: Patient will remain free of falls  Outcome: Progressing  Flowsheets (Taken 09/17/2020 0429)  VH Moderate Risk (6-13): ALL REQUIRED LOW INTERVENTIONS  VH High Risk (Greater than 13): ALL REQUIRED LOW INTERVENTIONS     Problem: Moderate/High Fall Risk Score >5  Goal: Patient will remain free of falls  Outcome: Progressing  Flowsheets (Taken 09/17/2020 0429)  VH Moderate Risk (6-13): ALL REQUIRED LOW INTERVENTIONS  VH High Risk (Greater than 13): ALL REQUIRED LOW INTERVENTIONS

## 2020-09-17 NOTE — Progress Notes (Signed)
Nutrition Therapy  Nutrition Assessment    Patient Information:     Name:Walter West   Age: 60 y.o.   Sex: male     MRN: [id number]    Recommendation:     1. Continue current diet    Nutrition History:   Screen for wt loss, decreased appetite, MST 3    60 yo pt adm with polysubstance abuse. Hx includes CVA, cocaine abuse, DM, HTN, medical non-compliance, meth abuse, and polysubstance dependence (including opiates). Pt c/o right arm numbness & chest pain today.  Pt tachycardic. Hx of alcohol use. To have EKG & CT chest. CIWA protocol initiated. Pt on a Consistent Carbohydrate, Heart Healthy Diet. Pt states he ate 100% of his breakfast and lunch today. Pt denies any issues with his appetite or intakes pta. Pt states he typically eats meals TID but likes to space them apart throughout the day. CBW: 144# reflects no significant wt change per EMR wt hx review.     Nutrition Focus Physical Findings: Normal    Nutrition Risk Level: Low    Nutrition Diagnosis:     None at this time     Monitoring:  Evaluation:    PO/EN/PN intake:  Amount of food    Labs:  Electrolyte Profile, Glucose, casual  and Glucose, fasting   GI Profile:  Bowel Function   Nutrition Focused Physical:  Overall appearance       Assessment Data:     Admission Dx:  Altered mental status [R41.82]  Polysubstance abuse [F19.10]  PMH:  has a past medical history of Cerebrovascular accident, Cocaine abuse, Diabetes mellitus, Hypertension, Medical non-compliance, Methamphetamine abuse, and Polysubstance (including opioids) dependence, binge pattern.  PSH:  has a past surgical history that includes Hernia repair and APPENDECTOMY (OPEN) (2011).     Height: 1.626 m (5\' 4" )   Weight: 65.4 kg (144 lb 2.9 oz)   BMI: Body mass index is 24.75 kg/m.   IBW: 59.1kig    Pertinent Meds: folic acid, insulin, multivitamin, thiamine  Pertinent Labs: reviewed  Recent Labs   Lab 09/17/20  0452 09/16/20  1642   Sodium 139 137   Potassium 3.6 3.6   Chloride 109 102   CO2 21  22   BUN 17 30*   Creatinine 0.77* 1.54*   Glucose 167* 340*   Calcium 8.1* 8.9   Osmolality Calculated 283 293          Diet Order:  Orders Placed This Encounter   Procedures   . Diet Consistent Carbohydrate and Heart Healthy        GI symptoms:    Last BM pta    Skin:  No PI documented    Food Security Issues:  No      Learning Needs:  No education needs at this time    Estimated Needs:  Estimated Energy Needs  Total Energy Estimated Needs: 1635-1962kcals/d  Method for Estimating Needs: 25-30 kcals/kg CBW 65.4kg  Estimated Protein Needs  Total Protein Estimated Needs: 65-78g Pro/d  Method for Estimating Needs: 1-1.2g Pro/kg CBW 65.4kg       Additional Comments:       Marjean Donna, RD  09/17/2020 5:30 PM
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
## 2020-09-17 NOTE — Plan of Care (Signed)
Problem: Moderate/High Fall Risk Score >5  Goal: Patient will remain free of falls  Outcome: Progressing  Flowsheets (Taken 09/17/2020 1000)  VH Moderate Risk (6-13):   ALL REQUIRED LOW INTERVENTIONS   INITIATE YELLOW "FALL RISK" SIGNAGE   YELLOW NON-SKID SLIPPERS   YELLOW "FALL RISK" ARM BAND   USE OF BED EXIT ALARM IF PATIENT IS CONFUSED OR IMPULSIVE. PLACE RESET BED ALARM SIGN ABOVE BED   PLACE FALL RISK LEVEL ON WHITE BOARD FOR COMMUNICATION PURPOSES IN PATIENT'S ROOM   Remain with patient during toileting   Use assistive devices  VH High Risk (Greater than 13):   ALL REQUIRED LOW INTERVENTIONS   ALL REQUIRED MODERATE INTERVENTIONS   RED "HIGH FALL RISK" SIGNAGE   BED ALARM WILL BE ACTIVATED WHEN THE PATEINT IS IN BED WITH SIGNAGE "RESET BED ALARM"   PATIENT IS TO BE SUPERVISED FOR ALL TOILETING ACTIVITIES   A safety companion may be used when deemed appropriate by the Primary RN and Clinical Administrator   Use assistive devices     Problem: Diabetes: Glucose Imbalance  Goal: Blood glucose stable at established goal  Outcome: Progressing  Flowsheets (Taken 09/17/2020 0436 by Synthia Innocent, RN)  Blood glucose stable at established goal:   Monitor lab values   Assess for hypoglycemia /hyperglycemia   Ensure adequate hydration

## 2020-09-17 NOTE — Consults (Signed)
Reason for Visit  Visited patient in response to consult to assist with advance care planning    Assessment  Walter West was resting in bed at time of visit. He said he has completed paper work at home detailing instructions for his care and MPOA.    Intervention  I asked if he was interested in completing another on at the hospital. His response was yes, however he did not havs his ID on him, which the notary would need to notarize the document.     Spiritual Care Plan  Chaplaincy will continue to follow.         09/17/20 1300   Visit Type   Visit Type Initial   Visit Source   Visit Source Ancillary Consult   Present at Visit   Present at Visit Patient   Spiritual Care Provided To   Spiritual Care Provided To Patient   Reason for Request   Reason for Request Ancillary Consult   Ancillary Consult Advance Care Planning   Spiritual Care Interventions   Spiritual Care Interventions Advance Care Planning   Spiritual Care Outcomes   Spiritual Care Outcomes Patient Expressed Appreciation of Visit   Length of Visit   Length of Visit(Retired) 0-15 minutes   Follow-Up   Follow-up Follow-up to reassess

## 2020-09-17 NOTE — Progress Notes (Signed)
NURSE NOTE SUMMARY  [facility] - GI/ENDO/GEN MED   Patient Name: Silvestre Gunner   Attending Physician: Kandice Robinsons, MD   Today's date:   09/17/2020 LOS: 0 days   Shift Summary:                                                              0700-Assumed care of Pt. Resting comfortably in bed, bed in low position, call bell within reach. No needs at this time.  1400-Pt c/o chest pain, numbness and tingling to right arm and left leg. MD notified, ekg ordered.  1500-Ambulating in hall, denies pain or cardiac symptoms. VSS       Provider Notifications:        Rapid Response Notifications:  Mobility:      PMP Activity: Step 6 - Walks in Room (09/17/2020 10:00 AM)     Weight tracking:  Family Dynamic:   Last 3 Weights for the past 72 hrs (Last 3 readings):   Weight   09/17/20 0358 65.4 kg (144 lb 2.9 oz)   09/16/20 1626 64 kg (141 lb)             Last Bowel Movement   No data recorded

## 2020-09-18 LAB — ECG 12-LEAD
P Wave Axis: 56 deg
Patient Age: 60 years
Q-T Interval(Corrected): 477 ms
Q-T Interval: 364 ms
QRS Duration: 90 ms
T Axis: 62 years
Ventricular Rate: 103 //min

## 2020-09-18 LAB — VH DEXTROSE STICK GLUCOSE
Glucose POCT: 165 mg/dL — ABNORMAL HIGH (ref 71–99)
Glucose POCT: 189 mg/dL — ABNORMAL HIGH (ref 71–99)
Glucose POCT: 259 mg/dL — ABNORMAL HIGH (ref 71–99)

## 2020-09-18 MED ORDER — THIAMINE HCL 100 MG PO TABS
100.0000 mg | ORAL_TABLET | Freq: Every day | ORAL | 0 refills | Status: AC
Start: 2020-09-19 — End: ?

## 2020-09-18 MED ORDER — CEROVITE ADVANCED FORMULA PO TABS
1.0000 | ORAL_TABLET | Freq: Every day | ORAL | Status: AC
Start: 2020-09-19 — End: ?

## 2020-09-18 MED ORDER — FOLIC ACID 1 MG PO TABS
1.0000 mg | ORAL_TABLET | Freq: Every day | ORAL | 0 refills | Status: AC
Start: 2020-09-19 — End: ?

## 2020-09-18 NOTE — Plan of Care (Addendum)
NURSE NOTE SUMMARY  [facility] - GI/ENDO/GEN MED   Patient Name: Walter West   Attending Physician: Myra Gianotti*   Today's date:   09/18/2020 LOS: 0 days   Shift Summary:                                                              0800-Alert and oriented x4. CIWA score 0. Denies pain. Assessment completed. Medications given as ordered.   1200- Discharge instructions reviewed with patient. States he understands instructions.   Provider Notifications:        Rapid Response Notifications:  Mobility:      PMP Activity: Step 7 - Walks out of Room (09/17/2020  7:38 PM)     Weight tracking:  Family Dynamic:   Last 3 Weights for the past 72 hrs (Last 3 readings):   Weight   09/18/20 0345 66 kg (145 lb 9.6 oz)   09/17/20 0358 65.4 kg (144 lb 2.9 oz)   09/16/20 1626 64 kg (141 lb)             Last Bowel Movement   No data recorded

## 2020-09-18 NOTE — Progress Notes (Signed)
Patient unable to locate a ride to take him home. Indigent transport set up with [employer] for 2pm. Trip # 29. Cost is $110. [employer]- [phone]

## 2020-09-18 NOTE — Discharge Instr - AVS First Page (Signed)
Call your physician or return to the emergency department with any worsening symptoms including fever, chest pain, shortness of breath, headache    Avoid alcohol and any unprescribed medications or illegal drugs

## 2020-09-18 NOTE — Discharge Summary (Signed)
Medicine Discharge Summary   [facility]  [facility]   Patient Name: Walter West   Attending Physician: Myra Gianotti* PCP: Marisa Sprinkles, MD   Date of Admission: 09/16/2020 D/C Date: 09/18/20   Discharge Diagnoses:     Polysubstance abuse  Altered mental status secondary to substance abuse  History of stroke  Diabetes mellitus type 2  Hypertension       Hospital Course       Dimitrious Micciche is a 60 y.o. male patient that was admitted on 09/16/2020 after presenting emergency department with altered mental status.  Urine drug screen was positive for amphetamine and benzodiazepine.  Alcohol level was low.  He was monitored with CIWA protocol.  His cognitive status cleared nicely.  He was advised to avoid alcohol and illicit substances.  He was advised to seek counseling for substance abuse.  Urine was positive for yeast and he was given a single dose of Diflucan.  He continued with amlodipine and atenolol for tachycardia and hypertension.  Oral diabetes medications were restarted at discharge after receiving insulin while hospitalized.  Aspirin and statin were restarted for history of stroke and dyslipidemia.  Getting safely and eating well at the time of discharge.        Pending Results and other significant studies:  None     Discharge Instructions:          Disposition: Home  Diet: Consistent Carbohydrates  Activity: As tolerated  Discharge Code Status: Full Code    [facility]  [address]  [place] [address]  [phone]  Schedule an appointment as soon as possible for a visit in 1 week(s)         Discharge Medications:                                                                        Discharge Medication List      Taking    amLODIPine 5 MG tablet  Dose: 5 mg  Commonly known as: NORVASC  Take 1 tablet (5 mg total) by mouth daily     aspirin 81 MG chewable tablet  Dose: 81 mg  Chew 1 tablet (81 mg total) by mouth daily     atenolol 25  MG tablet  Dose: 12.5 mg  Commonly known as: TENORMIN  Take 0.5 tablets (12.5 mg total) by mouth daily     atorvastatin 80 MG tablet  Dose: 80 mg  Commonly known as: LIPITOR  Take 1 tablet (80 mg total) by mouth nightly     folic acid 1 MG tablet  Dose: 1 mg  Commonly known as: FOLVITE  Start taking on: Sep 19, 2020  Take 1 tablet (1 mg total) by mouth daily     glipiZIDE 10 MG tablet  Dose: 10 mg  Commonly known as: GLUCOTROL  Take 1 tablet (10 mg total) by mouth every morning before breakfast     metFORMIN 500 MG tablet  Dose: 500 mg  Commonly known as: GLUCOPHAGE  Take 1 tablet (500 mg total) by mouth 2 (two) times daily with meals Start tomorrow dinner time     multivitamin Tabs  Dose: 1 tablet  Start taking  on: Sep 19, 2020  Take 1 tablet by mouth daily     thiamine 100 MG tablet  Dose: 100 mg  Commonly known as: B-1  Start taking on: Sep 19, 2020  Take 1 tablet (100 mg total) by mouth daily             Discharge Day Exam (09/18/2020):     Blood pressure 161/87, pulse (!) 109, temperature 98.2 F (36.8 C), temperature source Temporal, resp. rate 18, height 1.626 m (5\' 4" ), weight 66 kg (145 lb 9.6 oz), SpO2 98 %.      General: Patient is awake. In no acute distress.  Chest: CTA bilaterally. No rhonchi, no wheezing. No use of accessory muscles.  CVS: Normal rate and regular rhythm no murmurs, without JVD.  Abdomen: Soft, non-tender, no guarding or rigidity, with normal bowel sounds.  Extremities: No pitting edema, pulses palpable, no calf swelling and no gross deformity.  Skin: Warm, dry  NEURO: No motor or sensory deficits.     Recent Labs      Recent Labs   Lab 09/17/20  0451 09/16/20  1642   WBC 7.0 10.7   RBC 3.73* 4.31   Hemoglobin 11.4* 12.7*   Hematocrit 32.3* 36.9*   MCV 87 86   PLT CT 256 306     Recent Labs   Lab 09/16/20  1642   PT 10.6   PT INR 1.0   aPTT 24.0     Recent Labs   Lab 09/17/20  2103 09/17/20  1821 09/17/20  1554   Troponin I 0.01 0.01 <0.01     Lab Results   Component Value Date     HGBA1CPERCNT 8.3 09/17/2020     Recent Labs   Lab 09/17/20  0452 09/16/20  1642   Glucose 167* 340*   Sodium 139 137   Potassium 3.6 3.6   Chloride 109 102   CO2 21 22   BUN 17 30*   Creatinine 0.77* 1.54*   EGFR 104 52*   Calcium 8.1* 8.9     Recent Labs   Lab 09/16/20  1642   Albumin 3.4*   Protein, Total 6.6   Bilirubin, Total 0.8   Alkaline Phosphatase 104   ALT 11   AST (SGOT) 13        Allergies:      Patient has no known allergies.   Time spent on discharging the patient:  34 minutes   CT Head WO- (Rad read)    Result Date: 09/16/2020  1. No acute bleed or edema identified. 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 2. Maxillary and ethmoid paranasal sinus disease. ReadingStation:[email or web address]    CT Angiogram Chest    Result Date: 09/17/2020  No pulmonary embolism. ReadingStation:[id number]    XR Chest AP Portable    Result Date: 09/16/2020  1. No focal infiltrate, pleural effusion, or pneumothorax. Normal heart size. ReadingStation:[email or web address]     Home Health Needs:  There are no questions and answers to display.      Tenna Delaine, MD         09/18/20 10:57 AM   MRN: [id number]                                      CSN: [id number] DOB: 1960-10-26

## 2020-09-21 NOTE — UM Notes (Signed)
[facility] Utilization Management Review Sheet    Facility :  [facility]    NAME: Walter West  MR#: [id number]    CSN#: [id number]     DOB: April 03, 1961    ROOM: 527/527-A AGE: 59 y.o.    ADMIT DATE AND TIME: 09/16/2020  4:23 PM      PATIENT CLASS: Observation 09/17/2020 @ 0156     ATTENDING PHYSICIAN: No att. providers found  PAYOR:Payor: AET[employer] / Plan: AET[employer] POS / Product Type: COMMERCIAL /       AUTH #:     DIAGNOSIS:     ICD-10-CM    1. Polysubstance abuse  F19.10 Activity as tolerated   2. Altered mental status  R41.82    3. Altered mental status, unspecified altered mental status type  R41.82        HISTORY:   Past Medical History:   Diagnosis Date   . Cerebrovascular accident    . Cocaine abuse    . Diabetes mellitus    . Hypertension    . Medical non-compliance    . Methamphetamine abuse    . Polysubstance (including opioids) dependence, binge pattern      Patient presents to the ED with complaints of altered mental status and hyperglycemia.      09/16/2020    ED TREATMENT: Humulin R 6 units NS IV bolus x 3     LABS: Hgb 12.7 Hct 36.9 Glucose 340 BUN 30 Creatinine 1.54 EGFR 52 Albumin 3.4     CT Head WO-IMPRESSION:   1. No acute bleed or edema identified. 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  2. Maxillary and ethmoid paranasal sinus disease.    XR Chest AP Portable IMPRESSION:   1. No focal infiltrate, pleural effusion, or pneumothorax. Normal heart size.    VITALS: T97.6 P119 R20 BP91/60 Sat 99%        09/17/2020  ASSESSMENT & PLAN     # Altered Mental Status  d/t # Polysubstance Abuse  and # Yeast Infection  Urine  (+) for amphetamine, Benzodiazepenes, sperm, Yeasts and Blood.  Will admit observation given his continued severe agitation and monitor for vitals + any other s/s of withdrawal.    Will cover probable yeast infection w/ 1x 150mg  diflucan    # HTN  Continue norvasc and Atenolol both for tahcycardia  and BP    # DM2  Holding home oral hypoglycemics given dehydration.  Cover w/ SSI + 5U Lantus qhs.     # CAD  Stable. Continue home dose of  asa    # Dyslipidemia  Stable. Continue home dose of  lipitor      DVT PPx: SQH  Dispo: Admit to observation.    Code: full code    LABS: UA: Urine specific gravity confirmation >1.050 Leukocyte Esterase 250 Protein 30 Gluocse >=500 Blood small RBC 47 WBC 28 Sperm Occasional Hyaline Casts >20 Hyphal Yeast Occasional Budding Yeast Many Amphetamine presumptive positive Benzodiazepines Presumptive positive Hgb 11.4 Hct 32.3 RBC 3.73 Glucose 167 Creatinine 0.77 Calcium 8.1     CT Angiogram Chest IMPRESSION:   No pulmonary embolism.    VITALS: T99 P114 R14 BP163/97 Sat 96%      MEDICATIONS: Norvasc 5mg  PO Daily Aspirin 81mg  PO Daily Tenormin 12.5mg  PO Daily Lipitor 80mg  PO @ HS Diflucan 150mg  PO x 1 Folvite 1mg  PO Daily Heparin 5000units q 8 hrs Lantus 5units @  HS Humalog 1-9units 3 times daily before meals Humalog 1-7units @ HS Humalog 1-7units Daily PRN x 1 Cerovite 1 tablet PO Daily Sodium Chloride 3ml IV q 8 hrs Thiamine 100mg  PO Daily     09/18/2020    PATIENT DISCHARGED TO HOME ON 09/18/2020  2:03 PM      VITALS: T98.2 P109 R18 BP161/87 Sat 98%    MEDICATIONS: Norvasc 5mg  PO Daily Aspirin 81mg  PO Daily Tenormin 25mg  PO Daily Folvite 1 mg PO Daily Heparin 5000untis q 8 hrs Humalog 1-9units 3 times daily before meals Humalog 1-7units Daily PRN Cerovite 1 tablet PO Daily Sodium Chloride 3ml IV q 8 hrs Thiamine 100mg  PO Daily Tylenol 650mg  PO q 4 hrs PRN x 1     Mohd. Derflinger L. Danella Penton Loss adjuster, chartered, [facility] Utilization Management  Integrated Care Coordination Department  [facility] [address]  [address]  [place] [address]  [phone] Direct Line   [phone] Fax  [email or web address]

## 2020-10-18 ENCOUNTER — Emergency Department
Admission: EM | Admit: 2020-10-18 | Discharge: 2020-10-18 | Discharge status: Against Medical Advice | Payer: Commercial Managed Care - POS | Attending: Emergency Medicine | Admitting: Emergency Medicine

## 2020-10-18 DIAGNOSIS — Z5321 Procedure and treatment not carried out due to patient leaving prior to being seen by health care provider: Secondary | ICD-10-CM | POA: Insufficient documentation

## 2020-10-18 DIAGNOSIS — R443 Hallucinations, unspecified: Secondary | ICD-10-CM | POA: Insufficient documentation

## 2021-04-12 ENCOUNTER — Emergency Department
Admission: EM | Admit: 2021-04-12 | Discharge: 2021-04-12 | Discharge status: Against Medical Advice | Payer: Commercial Managed Care - POS | Attending: Emergency Medicine | Admitting: Emergency Medicine

## 2021-04-12 DIAGNOSIS — Z5321 Procedure and treatment not carried out due to patient leaving prior to being seen by health care provider: Secondary | ICD-10-CM | POA: Insufficient documentation

## 2021-04-12 DIAGNOSIS — R739 Hyperglycemia, unspecified: Secondary | ICD-10-CM | POA: Insufficient documentation

## 2021-04-12 LAB — VH DEXTROSE STICK GLUCOSE: Glucose POCT: 493 mg/dL — ABNORMAL HIGH (ref 71–99)

## 2021-04-12 NOTE — ED Notes (Addendum)
Neighbors granddaughter coming back into the ED stating that pt is in the car screaming, "they are coming to get me, we got to get out of here." Granddaughter also stating that there is an unloaded gun in the backseat of the car. [place] 911 center called and told of situation, staff asking if and officer can come to check out patient in the car. Security, and nursing supervisor made aware of situation.     911 center stating that 3 officers were on their way up to assess the situation.

## 2021-04-12 NOTE — ED Notes (Signed)
Noted on security cameras that patient and police have vacated the property at this time.

## 2021-04-12 NOTE — ED Notes (Signed)
[facility] center called stating, " We received a phone call from a person in the hospital stating that someone was trying to get into their room." Staff advised that were not aware of any threats. Nursing supervisor made aware. Dr. Clarita Leber in to check on patient and he ran out of room stating," they are coming after me."     Patient leaving ED in a hurry. Getting into neighbors car who brought patient.

## 2021-04-12 NOTE — ED Provider Notes (Addendum)
2043: Walked into patient room, patient immediately stood up and said "are they out there", "they are out there", he walked past me, would not talk to me, would not allow medical screening exam, he said he had to go, immediately walked out of the ED saying he "had to get away", patient walked with neighbor and neighbor daughter to their vehicle. Neighbor daughter walked inside waiting room to ask what they should do next. She states the patient came to their house tonight around 5pm saying people were after him. Patient neighbor took patient to the ED for assistance with patient. Patient currently in vehicle out front of the ED with neighbor and neighbor daughter staying in ED area. Police contacted at this time for assistance as daughter also states neighbor has gun in vehicle. Police en route.     2058: Notified by security, patient left premises after discussion with police.      Ulyses Jarred, DO  04/12/21 2100       Eugenie Birks T, DO  04/12/21 2100

## 2021-04-12 NOTE — ED Triage Notes (Addendum)
Pt brought in by neighbors for complaints of hyperglycemia at home. Pt states that his sugar "shot way high" at home. Pt states it was 625 at home. Pt 493 during triage. Pt A&O x4, but with paranoid thoughts. Stating, "People have moved into my house, and are associated with [employer]. They are stealing my medicines."     Pt's neighbor brought patient into ED tonight. Neighbor states, "He came running to my house stating someone was out to get him." Neighbor checked patients house and no one was in the house. Pt told neighbor that his sugar was high and he needed to go to the hospital.  Pt placed back into waiting room until bed in ED bed becomes available.
